# Patient Record
Sex: Female | Born: 1935 | Race: White | Hispanic: No | Smoking: Never smoker
Health system: Southern US, Community
[De-identification: ages and names within clinical notes are randomized; demographics above are authoritative.]

## PROBLEM LIST (undated history)

## (undated) DIAGNOSIS — E785 Hyperlipidemia, unspecified: Secondary | ICD-10-CM

## (undated) DIAGNOSIS — M545 Low back pain: Secondary | ICD-10-CM

## (undated) DIAGNOSIS — F419 Anxiety disorder, unspecified: Secondary | ICD-10-CM

## (undated) DIAGNOSIS — E538 Deficiency of other specified B group vitamins: Secondary | ICD-10-CM

## (undated) DIAGNOSIS — H509 Unspecified strabismus: Secondary | ICD-10-CM

## (undated) DIAGNOSIS — K635 Polyp of colon: Secondary | ICD-10-CM

## (undated) DIAGNOSIS — G629 Polyneuropathy, unspecified: Secondary | ICD-10-CM

## (undated) DIAGNOSIS — I10 Essential (primary) hypertension: Secondary | ICD-10-CM

## (undated) HISTORY — DX: Low back pain: M54.5

## (undated) HISTORY — DX: Polyp of colon: K63.5

## (undated) HISTORY — DX: Unspecified strabismus: H50.9

## (undated) HISTORY — DX: Essential (primary) hypertension: I10

## (undated) HISTORY — DX: Hyperlipidemia, unspecified: E78.5

## (undated) HISTORY — DX: Deficiency of other specified B group vitamins: E53.8

## (undated) HISTORY — PX: ABDOMINAL HYSTERECTOMY: SHX81

## (undated) HISTORY — DX: Anxiety disorder, unspecified: F41.9

## (undated) HISTORY — DX: Polyneuropathy, unspecified: G62.9

---

## 1998-03-21 ENCOUNTER — Ambulatory Visit (HOSPITAL_COMMUNITY): Admission: RE | Admit: 1998-03-21 | Discharge: 1998-03-21 | Payer: Self-pay | Admitting: Gastroenterology

## 2002-04-29 ENCOUNTER — Encounter: Admission: RE | Admit: 2002-04-29 | Discharge: 2002-04-29 | Payer: Self-pay | Admitting: Gastroenterology

## 2002-04-29 ENCOUNTER — Encounter: Payer: Self-pay | Admitting: Gastroenterology

## 2003-03-08 ENCOUNTER — Other Ambulatory Visit: Admission: RE | Admit: 2003-03-08 | Discharge: 2003-03-08 | Payer: Self-pay | Admitting: Internal Medicine

## 2004-09-25 ENCOUNTER — Ambulatory Visit: Payer: Self-pay | Admitting: Internal Medicine

## 2004-09-26 ENCOUNTER — Ambulatory Visit: Payer: Self-pay | Admitting: Internal Medicine

## 2005-04-15 ENCOUNTER — Ambulatory Visit: Payer: Self-pay | Admitting: Internal Medicine

## 2006-04-10 ENCOUNTER — Ambulatory Visit: Payer: Self-pay | Admitting: Internal Medicine

## 2006-10-14 ENCOUNTER — Ambulatory Visit: Payer: Self-pay | Admitting: Internal Medicine

## 2006-12-14 ENCOUNTER — Ambulatory Visit: Payer: Self-pay | Admitting: Internal Medicine

## 2006-12-14 LAB — CONVERTED CEMR LAB
ALT: 16 units/L (ref 0–40)
AST: 18 units/L (ref 0–37)
Albumin: 3.8 g/dL (ref 3.5–5.2)
Alkaline Phosphatase: 55 units/L (ref 39–117)
BUN: 20 mg/dL (ref 6–23)
Basophils Absolute: 0 10*3/uL (ref 0.0–0.1)
Basophils Relative: 0.8 % (ref 0.0–1.0)
Bilirubin Urine: NEGATIVE
Bilirubin, Direct: 0.1 mg/dL (ref 0.0–0.3)
CO2: 28 meq/L (ref 19–32)
Calcium: 8.9 mg/dL (ref 8.4–10.5)
Chloride: 110 meq/L (ref 96–112)
Cholesterol: 253 mg/dL (ref 0–200)
Creatinine, Ser: 0.8 mg/dL (ref 0.4–1.2)
Direct LDL: 126.5 mg/dL
Eosinophils Absolute: 0.1 10*3/uL (ref 0.0–0.6)
Eosinophils Relative: 2.3 % (ref 0.0–5.0)
GFR calc Af Amer: 90 mL/min
GFR calc non Af Amer: 74 mL/min
Glucose, Bld: 124 mg/dL — ABNORMAL HIGH (ref 70–99)
HCT: 39.3 % (ref 36.0–46.0)
HDL: 37.5 mg/dL — ABNORMAL LOW (ref >39.0)
Hemoglobin, Urine: NEGATIVE
Hemoglobin: 13.9 g/dL (ref 12.0–15.0)
Ketones, ur: NEGATIVE mg/dL
Leukocytes, UA: NEGATIVE
Lymphocytes Relative: 39.6 % (ref 12.0–46.0)
MCHC: 35.5 g/dL (ref 30.0–36.0)
MCV: 92.6 fL (ref 78.0–100.0)
Monocytes Absolute: 0.4 10*3/uL (ref 0.2–0.7)
Monocytes Relative: 8.5 % (ref 3.0–11.0)
Neutro Abs: 2.5 10*3/uL (ref 1.4–7.7)
Neutrophils Relative %: 48.8 % (ref 43.0–77.0)
Nitrite: NEGATIVE
Platelets: 229 10*3/uL (ref 150–400)
Potassium: 4.1 meq/L (ref 3.5–5.1)
RBC: 4.24 M/uL (ref 3.87–5.11)
RDW: 12.1 % (ref 11.5–14.6)
Sodium: 143 meq/L (ref 135–145)
Specific Gravity, Urine: 1.025 (ref 1.000–1.03)
TSH: 2.9 microintl units/mL (ref 0.35–5.50)
Total Bilirubin: 0.7 mg/dL (ref 0.3–1.2)
Total CHOL/HDL Ratio: 6.7
Total Protein, Urine: NEGATIVE mg/dL
Total Protein: 6.1 g/dL (ref 6.0–8.3)
Triglycerides: 412 mg/dL (ref 0–149)
Urine Glucose: NEGATIVE mg/dL
Urobilinogen, UA: 0.2 (ref 0.0–1.0)
VLDL: 82 mg/dL — ABNORMAL HIGH (ref 0–40)
Vit D, 1,25-Dihydroxy: 11 — ABNORMAL LOW (ref 20–57)
WBC: 4.9 10*3/uL (ref 4.5–10.5)
pH: 5.5 (ref 5.0–8.0)

## 2006-12-31 ENCOUNTER — Ambulatory Visit: Payer: Self-pay | Admitting: Internal Medicine

## 2007-04-15 ENCOUNTER — Ambulatory Visit: Payer: Self-pay | Admitting: Internal Medicine

## 2007-05-21 ENCOUNTER — Encounter: Payer: Self-pay | Admitting: Internal Medicine

## 2007-05-21 DIAGNOSIS — Z860100 Personal history of colon polyps, unspecified: Secondary | ICD-10-CM | POA: Insufficient documentation

## 2007-05-21 DIAGNOSIS — I10 Essential (primary) hypertension: Secondary | ICD-10-CM | POA: Insufficient documentation

## 2007-05-21 DIAGNOSIS — E785 Hyperlipidemia, unspecified: Secondary | ICD-10-CM

## 2007-05-21 DIAGNOSIS — M810 Age-related osteoporosis without current pathological fracture: Secondary | ICD-10-CM | POA: Insufficient documentation

## 2007-05-21 DIAGNOSIS — G4700 Insomnia, unspecified: Secondary | ICD-10-CM

## 2007-06-14 ENCOUNTER — Ambulatory Visit: Payer: Self-pay | Admitting: Internal Medicine

## 2007-06-14 DIAGNOSIS — R7309 Other abnormal glucose: Secondary | ICD-10-CM

## 2007-06-14 DIAGNOSIS — F411 Generalized anxiety disorder: Secondary | ICD-10-CM | POA: Insufficient documentation

## 2007-06-14 DIAGNOSIS — E559 Vitamin D deficiency, unspecified: Secondary | ICD-10-CM | POA: Insufficient documentation

## 2007-06-14 LAB — CONVERTED CEMR LAB
ALT: 14 units/L (ref 0–35)
AST: 16 units/L (ref 0–37)
Albumin: 4.1 g/dL (ref 3.5–5.2)
Alkaline Phosphatase: 44 units/L (ref 39–117)
BUN: 9 mg/dL (ref 6–23)
Bilirubin, Direct: 0.2 mg/dL (ref 0.0–0.3)
CO2: 30 meq/L (ref 19–32)
Calcium: 9.2 mg/dL (ref 8.4–10.5)
Chloride: 105 meq/L (ref 96–112)
Cholesterol: 152 mg/dL (ref 0–200)
Creatinine, Ser: 0.8 mg/dL (ref 0.4–1.2)
GFR calc Af Amer: 90 mL/min
GFR calc non Af Amer: 74 mL/min
Glucose, Bld: 103 mg/dL — ABNORMAL HIGH (ref 70–99)
HDL: 43.6 mg/dL (ref >39.0)
Hgb A1c MFr Bld: 6.1 % — ABNORMAL HIGH (ref 4.6–6.0)
LDL Cholesterol: 84 mg/dL (ref 0–99)
Potassium: 4.4 meq/L (ref 3.5–5.1)
Sodium: 140 meq/L (ref 135–145)
Total Bilirubin: 0.8 mg/dL (ref 0.3–1.2)
Total CHOL/HDL Ratio: 3.5
Total Protein: 6.2 g/dL (ref 6.0–8.3)
Triglycerides: 124 mg/dL (ref 0–149)
VLDL: 25 mg/dL (ref 0–40)

## 2007-06-18 LAB — CONVERTED CEMR LAB: Vit D, 1,25-Dihydroxy: 30 (ref 30–89)

## 2007-09-15 ENCOUNTER — Ambulatory Visit: Payer: Self-pay | Admitting: Internal Medicine

## 2007-09-15 DIAGNOSIS — E538 Deficiency of other specified B group vitamins: Secondary | ICD-10-CM | POA: Insufficient documentation

## 2007-09-15 DIAGNOSIS — G609 Hereditary and idiopathic neuropathy, unspecified: Secondary | ICD-10-CM | POA: Insufficient documentation

## 2007-09-17 LAB — CONVERTED CEMR LAB
CO2: 30 meq/L (ref 19–32)
Calcium: 9.2 mg/dL (ref 8.4–10.5)
GFR calc Af Amer: 90 mL/min
Glucose, Bld: 129 mg/dL — ABNORMAL HIGH (ref 70–99)
HDL: 41.5 mg/dL (ref >39.0)
LDL Cholesterol: 101 mg/dL — ABNORMAL HIGH (ref 0–99)
TSH: 2.1 microintl units/mL (ref 0.35–5.50)
Total CHOL/HDL Ratio: 4.2
Triglycerides: 165 mg/dL — ABNORMAL HIGH (ref 0–149)
VLDL: 33 mg/dL (ref 0–40)

## 2007-09-27 ENCOUNTER — Ambulatory Visit: Payer: Self-pay

## 2007-09-27 ENCOUNTER — Ambulatory Visit: Payer: Self-pay | Admitting: Endocrinology

## 2007-09-27 DIAGNOSIS — S8010XA Contusion of unspecified lower leg, initial encounter: Secondary | ICD-10-CM

## 2007-09-29 ENCOUNTER — Telehealth: Payer: Self-pay | Admitting: Internal Medicine

## 2007-10-04 ENCOUNTER — Encounter: Payer: Self-pay | Admitting: Internal Medicine

## 2008-01-13 ENCOUNTER — Ambulatory Visit: Payer: Self-pay | Admitting: Internal Medicine

## 2008-03-30 ENCOUNTER — Ambulatory Visit: Payer: Self-pay | Admitting: Internal Medicine

## 2008-07-19 ENCOUNTER — Ambulatory Visit: Payer: Self-pay | Admitting: Internal Medicine

## 2008-07-19 ENCOUNTER — Telehealth: Payer: Self-pay | Admitting: Internal Medicine

## 2008-07-19 DIAGNOSIS — R05 Cough: Secondary | ICD-10-CM

## 2008-07-19 DIAGNOSIS — J209 Acute bronchitis, unspecified: Secondary | ICD-10-CM

## 2008-11-06 ENCOUNTER — Ambulatory Visit: Payer: Self-pay | Admitting: Internal Medicine

## 2008-11-06 LAB — CONVERTED CEMR LAB
Albumin: 4 g/dL (ref 3.5–5.2)
Alkaline Phosphatase: 38 units/L — ABNORMAL LOW (ref 39–117)
BUN: 19 mg/dL (ref 6–23)
Basophils Absolute: 0 10*3/uL (ref 0.0–0.1)
Bilirubin Urine: NEGATIVE
Bilirubin, Direct: 0.2 mg/dL (ref 0.0–0.3)
CO2: 29 meq/L (ref 19–32)
Calcium: 9.1 mg/dL (ref 8.4–10.5)
Creatinine, Ser: 0.8 mg/dL (ref 0.4–1.2)
Creatinine,U: 252 mg/dL
Eosinophils Absolute: 0.1 10*3/uL (ref 0.0–0.7)
Glucose, Bld: 108 mg/dL — ABNORMAL HIGH (ref 70–99)
HDL: 43.2 mg/dL (ref >39.00)
Hemoglobin, Urine: NEGATIVE
Ketones, ur: NEGATIVE mg/dL
Lymphocytes Relative: 39.6 % (ref 12.0–46.0)
MCHC: 34.6 g/dL (ref 30.0–36.0)
Microalb, Ur: 1.7 mg/dL (ref 0.0–1.9)
Neutrophils Relative %: 49.7 % (ref 43.0–77.0)
Nitrite: NEGATIVE
RDW: 12 % (ref 11.5–14.6)
Total CHOL/HDL Ratio: 4
Triglycerides: 126 mg/dL (ref 0.0–149.0)
Vitamin B-12: 815 pg/mL (ref 211–911)

## 2008-11-10 ENCOUNTER — Ambulatory Visit: Payer: Self-pay | Admitting: Internal Medicine

## 2009-01-15 ENCOUNTER — Encounter: Payer: Self-pay | Admitting: Internal Medicine

## 2009-02-13 ENCOUNTER — Ambulatory Visit: Payer: Self-pay | Admitting: Internal Medicine

## 2009-02-13 ENCOUNTER — Telehealth: Payer: Self-pay | Admitting: Internal Medicine

## 2009-02-16 ENCOUNTER — Ambulatory Visit: Payer: Self-pay | Admitting: Internal Medicine

## 2009-02-19 ENCOUNTER — Encounter (INDEPENDENT_AMBULATORY_CARE_PROVIDER_SITE_OTHER): Payer: Self-pay | Admitting: *Deleted

## 2009-04-17 ENCOUNTER — Ambulatory Visit: Payer: Self-pay | Admitting: Internal Medicine

## 2009-05-25 ENCOUNTER — Ambulatory Visit: Payer: Self-pay | Admitting: Internal Medicine

## 2009-05-25 ENCOUNTER — Telehealth: Payer: Self-pay | Admitting: Internal Medicine

## 2009-05-25 DIAGNOSIS — Z87891 Personal history of nicotine dependence: Secondary | ICD-10-CM

## 2009-06-04 ENCOUNTER — Telehealth: Payer: Self-pay | Admitting: Internal Medicine

## 2009-06-07 ENCOUNTER — Ambulatory Visit: Payer: Self-pay | Admitting: Internal Medicine

## 2009-06-07 DIAGNOSIS — R062 Wheezing: Secondary | ICD-10-CM | POA: Insufficient documentation

## 2009-06-14 ENCOUNTER — Telehealth: Payer: Self-pay | Admitting: Internal Medicine

## 2009-06-15 ENCOUNTER — Ambulatory Visit: Payer: Self-pay | Admitting: Internal Medicine

## 2009-07-07 DIAGNOSIS — M545 Low back pain, unspecified: Secondary | ICD-10-CM

## 2009-07-07 HISTORY — DX: Low back pain, unspecified: M54.50

## 2009-07-25 ENCOUNTER — Telehealth: Payer: Self-pay | Admitting: Internal Medicine

## 2009-08-24 ENCOUNTER — Telehealth: Payer: Self-pay | Admitting: Internal Medicine

## 2009-09-21 ENCOUNTER — Telehealth: Payer: Self-pay | Admitting: Internal Medicine

## 2009-11-23 ENCOUNTER — Telehealth: Payer: Self-pay | Admitting: Internal Medicine

## 2010-01-18 ENCOUNTER — Encounter: Payer: Self-pay | Admitting: Internal Medicine

## 2010-01-21 ENCOUNTER — Telehealth: Payer: Self-pay | Admitting: Internal Medicine

## 2010-03-14 ENCOUNTER — Ambulatory Visit: Payer: Self-pay | Admitting: Internal Medicine

## 2010-03-25 ENCOUNTER — Telehealth: Payer: Self-pay | Admitting: Internal Medicine

## 2010-04-17 ENCOUNTER — Ambulatory Visit: Payer: Self-pay | Admitting: Internal Medicine

## 2010-04-17 DIAGNOSIS — M545 Low back pain: Secondary | ICD-10-CM

## 2010-04-17 LAB — CONVERTED CEMR LAB
Bilirubin Urine: NEGATIVE
Nitrite: NEGATIVE
Specific Gravity, Urine: 1.02 (ref 1.000–1.030)
Total Protein, Urine: NEGATIVE mg/dL
pH: 5.5 (ref 5.0–8.0)

## 2010-05-23 ENCOUNTER — Telehealth: Payer: Self-pay | Admitting: Internal Medicine

## 2010-08-04 LAB — CONVERTED CEMR LAB
CO2: 29 meq/L (ref 19–32)
Chloride: 107 meq/L (ref 96–112)
Creatinine, Ser: 0.9 mg/dL (ref 0.4–1.2)
Hgb A1c MFr Bld: 6.5 % — ABNORMAL HIGH (ref 4.6–6.0)
Vitamin B-12: 903 pg/mL (ref 211–911)

## 2010-08-06 NOTE — Progress Notes (Signed)
Summary: Benicar PA  Phone Note From Pharmacy   [facility]  [address] [id number] Call For: [phone]  ID:  [phone]  Summary of Call: PA request--Generic Benicar. Per [email or web address] cannot be done online. They will fax form. Initial call taken by: Lucious Groves,  July 25, 2009 2:22 PM  Follow-up for Phone Call        Form complete and at triage awaiting signature. Follow-up by: Lucious Groves,  July 25, 2009 2:55 PM  Additional Follow-up for Phone Call Additional follow up Details #1::        [facility] stating that alt for Benicar are Cozzar, Diovan and micardis. They would like to know if MD would consider or should be continue to get PA for benicar. Please advise Additional Follow-up by: Rock Nephew CMA,  July 25, 2009 4:09 PM    Additional Follow-up for Phone Call Additional follow up Details #2::    OK Diovan Follow-up by: Tresa Garter MD,  July 26, 2009 7:47 AM  Additional Follow-up for Phone Call Additional follow up Details #3:: Details for Additional Follow-up Action Taken: Pt informed, rx sent in Additional Follow-up by: Lamar Sprinkles, CMA,  July 26, 2009 8:45 AM  New/Updated Medications: DIOVAN 320 MG TABS (VALSARTAN) 1 by mouth once daily for blood pressure Prescriptions: DIOVAN 320 MG TABS (VALSARTAN) 1 by mouth once daily for blood pressure  #90 x 3   Entered by:   Lamar Sprinkles, CMA   Authorized by:   Tresa Garter MD   Signed by:   Lamar Sprinkles, CMA on 07/26/2009   Method used:   [facility]        [facility]  [address] [id number]       [address]       [place], [place]  [address]       Ph: [phone] or [phone]       Fax: [phone]   RxID:   [id number]

## 2010-08-06 NOTE — Assessment & Plan Note (Signed)
Summary: R LOWER SIDE BACK PAIN--REFUSED EARLIER DY W/ANOTHER MD--STC   Vital Signs:  Patient profile:   77 year old female Height:      62 inches Weight:      150 pounds BMI:     27.53 Temp:     97.7 degrees F oral Pulse rate:   72 / minute Pulse rhythm:   regular Resp:     16 per minute BP sitting:   160 / 98  (left arm) Cuff size:   regular  Vitals Entered By: Lanier Prude, CMA(AAMA) (April 17, 2010 1:23 PM) CC: LBP x 1 mo Is Patient Diabetic? No Comments pt needs Rf on Simvastating, Atenolol, Temazepam, Lorazepam and Diovan   Primary Care Provider:  Tresa Garter MD  CC:  LBP x 1 mo.  History of Present Illness: C/o R LBP x 1 month w/o irrad., bad pain. Heat is helpfull. It is agravated by standing. F/u HTN  Current Medications (verified): 1)  Simvastatin 40 Mg Tabs (Simvastatin) .... Take One Tablet Qd 2)  Atenolol 100 Mg Tabs (Atenolol) .Marland Kitchen.. 1 By Mouth Bid 3)  Temazepam 30 Mg  Caps (Temazepam) .... 2 At Bedtime 4)  Lorazepam 1 Mg  Tabs (Lorazepam) .... Two Times A Day As Needed Daytime 5)  Vitamin D3 1000 Unit  Tabs (Cholecalciferol) .Marland Kitchen.. 1 Qd 6)  Vitamin B-12 Cr 1000 Mcg  Tbcr (Cyanocobalamin) .... Take One Tablet By Mouth Daily 7)  Microzide 12.5 Mg Caps (Hydrochlorothiazide) .Marland Kitchen.. 1 By Mouth Q Am 8)  Promethazine-Codeine 6.25-10 Mg/2ml Syrp (Promethazine-Codeine) .Marland Kitchen.. 1 Tsp By Mouth Q 6 Hrs  As Needed Cough 9)  Proair Hfa 108 (90 Base) Mcg/act Aers (Albuterol Sulfate) .Marland Kitchen.. 1 Puff Four Times Per Day As Needed For Shortness of Breath 10)  Tessalon Perles 100 Mg Caps (Benzonatate) .Marland Kitchen.. 1 - 2 By Mouth Three Times A Day As Needed 11)  Prednisone 10 Mg Tabs (Prednisone) .... 3po Qd For 3days, Then 2po Qd For 3days, Then 1po Qd For 3days, Then Stop 12)  Diovan 320 Mg Tabs (Valsartan) .Marland Kitchen.. 1 By Mouth Once Daily For Blood Pressure  Allergies (verified): 1)  Zithromax 2)  Enalapril Maleate  Past History:  Social History: Last updated: 11/10/2008 Occupation:  Cust. service Single widow Former Smoker Retired  Past Medical History: Colonic polyps, hx of 272.0 Hyperlipidemia Hypertension Osteoporosis Anxiety Vit D def Peripheral neuropathy Vit B12 def strabismus, L eye Low back pain 2011 R  Review of Systems  The patient denies anorexia, fever, chest pain, dyspnea on exertion, and abdominal pain.    Physical Exam  General:  alert and overweight-appearing, NAD Nose:  nasal dischargemucosal pallor and mucosal erythema.   Mouth:  Erythematous throat mucosa and intranasal erythema is better Neck:  supple and no masses.   Lungs:  normal respiratory effort, R decreased breath sounds, R wheezes, L decreased breath sounds, and L wheezes.   Heart:  normal rate and regular rhythm.   Abdomen:  S/NT Neurologic:  alert & oriented X3.   Skin:  Intact without suspicious lesions or rashes Psych:  Oriented X3.     Impression & Recommendations:  Problem # 1:  LOW BACK PAIN (ICD-724.2) R MSK over R iliac crest and Some in R SI joint Assessment New  See "Patient Instructions".   Her updated medication list for this problem includes:    Ibuprofen 600 Mg Tabs (Ibuprofen) .Marland Kitchen... 1 by mouth bid  pc x 2 wk then as needed for  pain  Orders: TLB-Udip ONLY (81003-UDIP) T-Pelvis 1or 2 views (72170TC)  Problem # 2:  Hypertension Assessment: Unchanged Pt states BP is nl at home  Problem # 3:  B12 DEFICIENCY (ICD-266.2) Assessment: Comment Only On the regimen of medicine(s) reflected in the chart    Complete Medication List: 1)  Simvastatin 40 Mg Tabs (Simvastatin) .... Take one tablet qd 2)  Atenolol 100 Mg Tabs (Atenolol) .Marland Kitchen.. 1 by mouth bid 3)  Temazepam 30 Mg Caps (Temazepam) .... 2 at bedtime 4)  Lorazepam 1 Mg Tabs (Lorazepam) .... Two times a day as needed daytime 5)  Vitamin D3 1000 Unit Tabs (Cholecalciferol) .Marland Kitchen.. 1 qd 6)  Vitamin B-12 Cr 1000 Mcg Tbcr (Cyanocobalamin) .... Take one tablet by mouth daily 7)  Microzide 12.5 Mg Caps  (Hydrochlorothiazide) .Marland Kitchen.. 1 by mouth q am 8)  Promethazine-codeine 6.25-10 Mg/52ml Syrp (Promethazine-codeine) .Marland Kitchen.. 1 tsp by mouth q 6 hrs  as needed cough 9)  Proair Hfa 108 (90 Base) Mcg/act Aers (Albuterol sulfate) .Marland Kitchen.. 1 puff four times per day as needed for shortness of breath 10)  Tessalon Perles 100 Mg Caps (Benzonatate) .Marland Kitchen.. 1 - 2 by mouth three times a day as needed 11)  Prednisone 10 Mg Tabs (Prednisone) .... 3po qd for 3days, then 2po qd for 3days, then 1po qd for 3days, then stop 12)  Ibuprofen 600 Mg Tabs (Ibuprofen) .Marland Kitchen.. 1 by mouth bid  pc x 2 wk then as needed for  pain 13)  Azor 5-40 Mg Tabs (Amlodipine-olmesartan) .Marland Kitchen.. 1 by mouth qd  Patient Instructions: 1)  Use stretching and balance exercises that I have provided (15 min. or longer every day)  2)  Call if you are not better in a reasonable amount of time or if worse.  3)  Please schedule a follow-up appointment in 1 month. Prescriptions: AZOR 5-40 MG TABS (AMLODIPINE-OLMESARTAN) 1 by mouth qd  #30 x 11   Entered and Authorized by:   Tresa Garter MD   Signed by:   Tresa Garter MD on 04/17/2010   Method used:   Print then Give to Patient   RxID:   [id number] IBUPROFEN 600 MG TABS (IBUPROFEN) 1 by mouth bid  pc x 2 wk then as needed for  pain  #60 x 3   Entered and Authorized by:   Tresa Garter MD   Signed by:   Tresa Garter MD on 04/17/2010   Method used:   Print then Give to Patient   RxID:   [id number] LORAZEPAM 1 MG  TABS (LORAZEPAM) two times a day as needed daytime  #60 x 3   Entered and Authorized by:   Tresa Garter MD   Signed by:   Tresa Garter MD on 04/17/2010   Method used:   Print then Give to Patient   RxID:   [id number] TEMAZEPAM 30 MG  CAPS (TEMAZEPAM) 2 at bedtime  #60 x 6   Entered and Authorized by:   Tresa Garter MD   Signed by:   Tresa Garter MD on 04/17/2010   Method used:   Print then Give to Patient   RxID:    [id number] ATENOLOL 100 MG TABS (ATENOLOL) 1 by mouth bid  #60 Tablet x 11   Entered and Authorized by:   Tresa Garter MD   Signed by:   Tresa Garter MD on 04/17/2010   Method used:   Print then Give to Patient   RxID:   [id number]  SIMVASTATIN 40 MG TABS (SIMVASTATIN) Take one tablet qd  #30 Tablet x 11   Entered and Authorized by:   Tresa Garter MD   Signed by:   Tresa Garter MD on 04/17/2010   Method used:   Print then Give to Patient   RxID:   [id number] IBUPROFEN 600 MG TABS (IBUPROFEN) 1 by mouth bid  pc x 2 wk then as needed for  pain  #60 x 3   Entered and Authorized by:   Tresa Garter MD   Signed by:   Tresa Garter MD on 04/17/2010   Method used:   [facility]        [facility]  [address] [id number]       [address]       [place], [place]  [address]       Ph: [phone] or [phone]       Fax: [phone]   RxID:   [id number]

## 2010-08-06 NOTE — Progress Notes (Signed)
Summary: temazepam  Phone Note Other Incoming Call back at fax   [facility]  [phone] Summary of Call: refill on temazepam---ok x 1 refill--will fax back to pharmacy/vg Initial call taken by: Tora Perches,  August 24, 2009 4:49 PM    Prescriptions: TEMAZEPAM 30 MG  CAPS (TEMAZEPAM) 2 at bedtime  #60 x 1   Entered by:   Tora Perches   Authorized by:   Tresa Garter MD   Signed by:   Tora Perches on 08/24/2009   Method used:   Telephoned to ...       [facility]  [address] [id number]       [address]       [place], [place]  [address]       Ph: [phone] or [phone]       Fax: [phone]   RxID:   [id number]

## 2010-08-06 NOTE — Progress Notes (Signed)
Summary: DIOVAN  Phone Note Call from Patient Call back at Home Phone [phone]   Summary of Call: Pt was changed from Diovan to Azor. She is c/o hot flashes & sweats. Patient is requesting to change back to Diovan. She is ok w/cost Initial call taken by: Lamar Sprinkles, CMA,  May 23, 2010 10:10 AM  Follow-up for Phone Call        ok RTC 1-2 months  Follow-up by: Tresa Garter MD,  May 23, 2010 1:00 PM  Additional Follow-up for Phone Call Additional follow up Details #1::        Pt informed  Additional Follow-up by: Lamar Sprinkles, CMA,  May 23, 2010 2:23 PM   New Allergies: AZOR New/Updated Medications: DIOVAN 320 MG TABS (VALSARTAN) 1 by mouth once daily for blood pressure New Allergies: AZORPrescriptions: DIOVAN 320 MG TABS (VALSARTAN) 1 by mouth once daily for blood pressure  #90 x 3   Entered and Authorized by:   Tresa Garter MD   Signed by:   Lamar Sprinkles, CMA on 05/23/2010   Method used:   [facility]        [facility]  [address] [id number]       [address]       [place], [place]  [address]       Ph: [phone] or [phone]       Fax: [phone]   RxID:   [id number]

## 2010-08-06 NOTE — Progress Notes (Signed)
Summary: Lorazepam Rf  Phone Note Refill Request Message from:  Fax from Pharmacy  Refills Requested: Medication #1:  LORAZEPAM 1 MG  TABS two times a day as needed daytime   Dosage confirmed as above?Dosage Confirmed   Supply Requested: 60   Last Refilled: 02/20/2010 ***Last seen 06/2009****   Method Requested: Telephone to Pharmacy Next Appointment Scheduled: none Initial call taken by: Lanier Prude, [facility]),  March 25, 2010 11:38 AM  Follow-up for Phone Call        ok x 3 Follow-up by: Tresa Garter MD,  March 25, 2010 1:31 PM  Additional Follow-up for Phone Call Additional follow up Details #1::        Rx called to pharmacy Additional Follow-up by: Lanier Prude, [facility]),  March 25, 2010 2:15 PM    Prescriptions: LORAZEPAM 1 MG  TABS (LORAZEPAM) two times a day as needed daytime  #60 x 3   Entered by:   Lanier Prude, [facility])   Authorized by:   Tresa Garter MD   Signed by:   Lanier Prude, CMA(AAMA) on 03/25/2010   Method used:   Telephoned to ...       [facility]  [address] [id number]       [address]       [place], [place]  [address]       Ph: [phone] or [phone]       Fax: [phone]   RxID:   [id number]

## 2010-08-06 NOTE — Assessment & Plan Note (Signed)
Summary: flu shot/cd   Nurse Visit   Allergies: 1)  Zithromax 2)  Enalapril Maleate  Immunizations Administered:  Pneumonia Vaccine:    Vaccine Type: Pneumovax (Medicare)    Site: left deltoid    Mfr: Merck    Dose: 0.5 ml    Route: IM    Given by: Margaret Pyle, CMA    Exp. Date: 09/19/2011    Lot #: [id number]    VIS given: 06/11/09 version given March 14, 2010.  Orders Added: 1)  Flu Vaccine 6yrs + MEDICARE PATIENTS [Q2039] 2)  Administration Flu vaccine - MCR [G0008] 3)  Pneumococcal Vaccine [90732] 4)  Admin 1st Vaccine [47829] Flu Vaccine Consent Questions     Do you have a history of severe allergic reactions to this vaccine? no    Any prior history of allergic reactions to egg and/or gelatin? no    Do you have a sensitivity to the preservative Thimersol? no    Do you have a past history of Guillan-Barre Syndrome? no    Do you currently have an acute febrile illness? no    Have you ever had a severe reaction to latex? no    Vaccine information given and explained to patient? yes    Are you currently pregnant? no    Lot Number:[id number]   Exp Date:01/04/2011   Site Given  Right Deltoid IMmedflu

## 2010-08-06 NOTE — Progress Notes (Signed)
Summary: Rf Temazepam  Phone Note Refill Request Message from:  Fax from Pharmacy  Refills Requested: Medication #1:  TEMAZEPAM 30 MG  CAPS 2 at bedtime   Dosage confirmed as above?Dosage Confirmed   Supply Requested: 1 month   Last Refilled: 12/24/2009 last OV 06/2009   Method Requested: Telephone to Pharmacy Next Appointment Scheduled: none Initial call taken by: Lanier Prude, [facility]),  January 21, 2010 11:57 AM  Follow-up for Phone Call        ok X 6 Follow-up by: Tresa Garter MD,  January 21, 2010 1:04 PM  Additional Follow-up for Phone Call Additional follow up Details #1::        Rx called to pharmacy Additional Follow-up by: Lanier Prude, [facility]),  January 21, 2010 1:28 PM    Prescriptions: TEMAZEPAM 30 MG  CAPS (TEMAZEPAM) 2 at bedtime  #60 x 6   Entered and Authorized by:   Lanier Prude, CMA(AAMA)   Signed by:   Lanier Prude, CMA(AAMA) on 01/21/2010   Method used:   Telephoned to ...       [facility]  [address] [id number]       [address]       [place], [place]  [address]       Ph: [phone] or [phone]       Fax: [phone]   RxID:   [id number]

## 2010-08-06 NOTE — Progress Notes (Signed)
Summary: temazepam  Phone Note Other Incoming Call back at fax   [facility] [phone] Summary of Call: refill request on temazepam Initial call taken by: Tora Perches,  September 21, 2009 3:20 PM    Prescriptions: TEMAZEPAM 30 MG  CAPS (TEMAZEPAM) 2 at bedtime  #60 x 3   Entered by:   Tora Perches   Authorized by:   Tresa Garter MD   Signed by:   Tora Perches on 09/21/2009   Method used:   Telephoned to ...       [facility]  [address] [id number]       [address]       [place], [place]  [address]       Ph: [phone] or [phone]       Fax: [phone]   RxID:   [id number]

## 2010-08-06 NOTE — Progress Notes (Signed)
Summary: Refill--Lorazepam  Phone Note Refill Request Message from:  Fax from Pharmacy on Nov 23, 2009 2:17 PM  Refills Requested: Medication #1:  LORAZEPAM 1 MG  TABS two times a day as needed daytime Initial call taken by: Lucious Groves,  Nov 23, 2009 2:17 PM  Follow-up for Phone Call        ok 3 ref Follow-up by: Tresa Garter MD,  Nov 23, 2009 5:16 PM    Prescriptions: LORAZEPAM 1 MG  TABS (LORAZEPAM) two times a day as needed daytime  #60 x 3   Entered by:   Lamar Sprinkles, CMA   Authorized by:   Tresa Garter MD   Signed by:   Lamar Sprinkles, CMA on 11/23/2009   Method used:   Telephoned to ...       [facility]  [address] [id number]       [address]       [place], [place]  [address]       Ph: [phone] or [phone]       Fax: [phone]   RxID:   [id number]

## 2010-09-25 ENCOUNTER — Other Ambulatory Visit: Payer: Self-pay | Admitting: Internal Medicine

## 2010-09-26 ENCOUNTER — Telehealth: Payer: Self-pay | Admitting: *Deleted

## 2010-09-26 NOTE — Telephone Encounter (Signed)
Ok 2 ref 

## 2010-09-26 NOTE — Telephone Encounter (Signed)
Rf req for Lorazepam 1 mg  1 po bid prn  # 60.  Last filled 08-23-10.Marland Kitchen..Marland Kitchenok to Rf?

## 2010-09-27 ENCOUNTER — Telehealth: Payer: Self-pay | Admitting: *Deleted

## 2010-09-27 MED ORDER — LORAZEPAM 1 MG PO TABS: 1.0000 mg | ORAL_TABLET | Freq: Two times a day (BID) | ORAL | Status: DC

## 2010-09-27 NOTE — Telephone Encounter (Signed)
Rf phoned in.  

## 2010-09-27 NOTE — Telephone Encounter (Signed)
error 

## 2010-10-01 ENCOUNTER — Telehealth: Payer: Self-pay | Admitting: *Deleted

## 2010-10-01 DIAGNOSIS — I10 Essential (primary) hypertension: Secondary | ICD-10-CM

## 2010-10-01 NOTE — Telephone Encounter (Signed)
Pt is req cholesterol and glucose be checked prior to 11-13-10 appt with you....please add orders.

## 2010-10-02 NOTE — Telephone Encounter (Signed)
Pt informed

## 2010-10-02 NOTE — Telephone Encounter (Signed)
ok 

## 2010-10-23 ENCOUNTER — Telehealth: Payer: Self-pay | Admitting: *Deleted

## 2010-10-23 NOTE — Telephone Encounter (Signed)
rec rf req for Temazepam 30mg .   2 po qhs.  # 60. Last filled 09-25-10.  Ok to Rf?

## 2010-10-24 ENCOUNTER — Telehealth: Payer: Self-pay | Admitting: *Deleted

## 2010-10-24 NOTE — Telephone Encounter (Signed)
rec rf req for Temazepam 30mg  2 po qhs. # 60. Ok to Rf?

## 2010-10-25 MED ORDER — TEMAZEPAM 30 MG PO CAPS: 60.0000 mg | ORAL_CAPSULE | Freq: Every evening | ORAL | Status: DC | PRN

## 2010-10-25 NOTE — Telephone Encounter (Signed)
rf phoned in 

## 2010-10-25 NOTE — Telephone Encounter (Signed)
OK to fill this prescription with additional refills x3 Thank you!  

## 2010-10-25 NOTE — Telephone Encounter (Signed)
OK to fill this prescription with additional refills x1. Is she due OV? - pls sch Thank you!

## 2010-11-05 ENCOUNTER — Other Ambulatory Visit (INDEPENDENT_AMBULATORY_CARE_PROVIDER_SITE_OTHER): Payer: BC Managed Care – PPO | Admitting: Internal Medicine

## 2010-11-05 ENCOUNTER — Other Ambulatory Visit (INDEPENDENT_AMBULATORY_CARE_PROVIDER_SITE_OTHER): Payer: BC Managed Care – PPO

## 2010-11-05 DIAGNOSIS — I10 Essential (primary) hypertension: Secondary | ICD-10-CM

## 2010-11-05 DIAGNOSIS — E785 Hyperlipidemia, unspecified: Secondary | ICD-10-CM

## 2010-11-05 LAB — BASIC METABOLIC PANEL
BUN: 15 mg/dL (ref 6–23)
CO2: 29 mEq/L (ref 19–32)
Chloride: 107 mEq/L (ref 96–112)
Creatinine, Ser: 0.8 mg/dL (ref 0.4–1.2)
Glucose, Bld: 108 mg/dL — ABNORMAL HIGH (ref 70–99)
Potassium: 4.6 mEq/L (ref 3.5–5.1)

## 2010-11-05 LAB — LIPID PANEL
Cholesterol: 165 mg/dL (ref 0–200)
Total CHOL/HDL Ratio: 4
VLDL: 49.4 mg/dL — ABNORMAL HIGH (ref 0.0–40.0)

## 2010-11-05 LAB — LDL CHOLESTEROL, DIRECT: Direct LDL: 85.6 mg/dL

## 2010-11-13 ENCOUNTER — Encounter: Payer: Self-pay | Admitting: Internal Medicine

## 2010-11-13 ENCOUNTER — Ambulatory Visit (INDEPENDENT_AMBULATORY_CARE_PROVIDER_SITE_OTHER): Payer: Medicare Other | Admitting: Internal Medicine

## 2010-11-13 DIAGNOSIS — R7309 Other abnormal glucose: Secondary | ICD-10-CM

## 2010-11-13 DIAGNOSIS — R202 Paresthesia of skin: Secondary | ICD-10-CM

## 2010-11-13 DIAGNOSIS — R739 Hyperglycemia, unspecified: Secondary | ICD-10-CM

## 2010-11-13 DIAGNOSIS — M545 Low back pain: Secondary | ICD-10-CM

## 2010-11-13 DIAGNOSIS — I10 Essential (primary) hypertension: Secondary | ICD-10-CM

## 2010-11-13 DIAGNOSIS — M810 Age-related osteoporosis without current pathological fracture: Secondary | ICD-10-CM

## 2010-11-13 DIAGNOSIS — R209 Unspecified disturbances of skin sensation: Secondary | ICD-10-CM

## 2010-11-13 MED ORDER — VALSARTAN 320 MG PO TABS: 320.0000 mg | ORAL_TABLET | Freq: Every day | ORAL | Status: DC

## 2010-11-13 MED ORDER — GABAPENTIN 100 MG PO CAPS: 100.0000 mg | ORAL_CAPSULE | Freq: Three times a day (TID) | ORAL | Status: DC | PRN

## 2010-11-13 MED ORDER — MELOXICAM 15 MG PO TABS: 15.0000 mg | ORAL_TABLET | Freq: Every day | ORAL | Status: DC | PRN

## 2010-11-13 MED ORDER — HYDROCHLOROTHIAZIDE 12.5 MG PO CAPS
12.5000 mg | ORAL_CAPSULE | ORAL | Status: DC
Start: 2010-11-13 — End: 2011-07-10

## 2010-11-13 MED ORDER — TEMAZEPAM 30 MG PO CAPS: 60.0000 mg | ORAL_CAPSULE | Freq: Every evening | ORAL | Status: DC | PRN

## 2010-11-13 MED ORDER — ATENOLOL 100 MG PO TABS: 100.0000 mg | ORAL_TABLET | Freq: Two times a day (BID) | ORAL | Status: DC

## 2010-11-13 MED ORDER — SIMVASTATIN 40 MG PO TABS: 40.0000 mg | ORAL_TABLET | Freq: Every day | ORAL | Status: DC

## 2010-11-13 NOTE — Progress Notes (Signed)
  Subjective:    Patient ID: Sandra [name], female    DOB: 1928-07-28, 77 y.o.   MRN: [id number]  HPI  C/o LBP x months, worse with standing - pain is 8/10. Pelvis xray w/DJD. Ibuprofen did not help. F/u HTN, anxiety. C/o burning and tingling in B feet - worse at hs.  Review of Systems  Constitutional: Positive for activity change. Negative for fatigue.  HENT: Negative for neck stiffness.   Eyes: Negative for redness.  Respiratory: Negative for shortness of breath.   Cardiovascular: Negative for leg swelling.  Genitourinary: Negative for flank pain.  Musculoskeletal: Positive for back pain. Negative for joint swelling.  Neurological: Negative for tremors and weakness.  Psychiatric/Behavioral: Negative for decreased concentration.       Objective:   Physical Exam  Constitutional: She appears well-developed and well-nourished. No distress.  HENT:  Head: Normocephalic.  Right Ear: External ear normal.  Left Ear: External ear normal.  Nose: Nose normal.  Mouth/Throat: Oropharynx is clear and moist.  Eyes: Conjunctivae are normal. Pupils are equal, round, and reactive to light. Right eye exhibits no discharge. Left eye exhibits no discharge.  Neck: Normal range of motion. Neck supple. No JVD present. No tracheal deviation present. No thyromegaly present.  Cardiovascular: Normal rate, regular rhythm and normal heart sounds.   Pulmonary/Chest: No stridor. No respiratory distress. She has no wheezes.  Abdominal: Soft. Bowel sounds are normal. She exhibits no distension and no mass. There is no tenderness. There is no rebound and no guarding.  Musculoskeletal: She exhibits tenderness (LS pain w/ROM). She exhibits no edema.  Lymphadenopathy:    She has no cervical adenopathy.  Neurological: She displays normal reflexes. No cranial nerve deficit. She exhibits normal muscle tone. Coordination normal.  Skin: No rash noted. No erythema.  Psychiatric: She has a normal mood and affect. Her  behavior is normal. Judgment and thought content normal.        Lab Results  Component Value Date   WBC 5.1 11/06/2008   HGB 13.3 11/06/2008   HCT 38.5 11/06/2008   PLT 169.0 11/06/2008   CHOL 165 11/05/2010   TRIG 247.0* 11/05/2010   HDL 42.40 11/05/2010   LDLDIRECT 85.6 11/05/2010   ALT 18 11/06/2008   AST 15 11/06/2008   NA 143 11/05/2010   K 4.6 11/05/2010   CL 107 11/05/2010   CREATININE 0.8 11/05/2010   BUN 15 11/05/2010   CO2 29 11/05/2010   TSH 1.71 11/06/2008   HGBA1C 6.3 11/06/2008   MICROALBUR 1.7 11/06/2008     Assessment & Plan:  HYPERTENSION On Rx BP Readings from Last 3 Encounters:  11/13/10 146/88  04/17/10 160/98  06/15/09 172/104     LOW BACK PAIN OA - worse. Stretches. See meds  OSTEOPOROSIS Check Vit D

## 2010-11-13 NOTE — Assessment & Plan Note (Signed)
OA - worse. Stretches. See meds

## 2010-11-13 NOTE — Assessment & Plan Note (Signed)
Check Vit D 

## 2010-11-13 NOTE — Assessment & Plan Note (Signed)
On Rx BP Readings from Last 3 Encounters:  11/13/10 146/88  04/17/10 160/98  06/15/09 172/104

## 2010-11-14 ENCOUNTER — Encounter: Payer: Self-pay | Admitting: Internal Medicine

## 2010-11-19 NOTE — Assessment & Plan Note (Signed)
[facility]                                 ON-CALL NOTE   NAME:[name], Sandra                          MRN:          [id number]  DATE:09/26/2007                            DOB:          August 11, 1928    The patient a month ago had something hit the lower part of her leg and  had a significant bruise but no cuts and it did turn different colors.  However, over the last week she has had some increasing redness about  the area and soreness without any discharge, streaking or fever.  She  stated the original problem was 1 x 2 inches and now it is up 2-1/2  inches more going up her leg.  I recommended she use warm compresses,  take her temperature and if over the next number of hours it is  increasing in size or in pain severity to seek care in the emergency  room.  Otherwise, see her doctor tomorrow or if her leg swells up she  will go to the emergency room also.     Neta Mends. Panosh, MD  Electronically Signed    WKP/MedQ  DD: 09/26/2007  DT: 09/26/2007  Job #: [id number]

## 2010-11-19 NOTE — Assessment & Plan Note (Signed)
[facility]                           PRIMARY CARE OFFICE NOTE   NAME:[name], [name]                        MRN:          [id number]  DATE:12/14/2006                            DOB:          Oct 08, 1928    The patient is a 77 year old female who presents for a wellness  examination.   ALLERGIES:  PENICILLIN, SULFA, LOPID, and ASPIRIN causes cramps and  stomach discomfort.   PAST SURGICAL HISTORY:  Partial hysterectomy.   PAST MEDICAL HISTORY:  Hypertension, insomnia, anxiety, dyslipidemia,  osteoporosis, history of colon polyps, history of hemorrhoids.   CURRENT MEDICATIONS:  1. Temazepam 30 mg 2 at night.  2. Lorazepam 1 mg b.i.d. p.r.n. daytime.  3. Atenolol 100 mg daily.  4. Amitriptyline 25 mg nightly.  5. Vytorin. She has been out of this lately   REVIEW OF SYSTEMS:  No chest pain or shortness of breath. No syncope. No  neurologic complaints. Some respiratory illness lately with purulent  nasal discharge. There was also some blood in the stool. The rest of the  18 point review of systems is negative.   PHYSICAL EXAMINATION:  GENERAL:  She is in no acute distress. She looks  well.  VITAL SIGNS:  Blood pressure 168/77, pulse 66, temperature 98.1, weight  150 pounds.  HEENT: With erythematous throat.  NECK:  Supple, no thyromegaly or bruit.  LUNGS:  Clear. No wheezes or rales.  HEART:  S1, S2, no murmur or gallop.  ABDOMEN:  Soft and nontender, no organomegaly, or masses.  LOWER EXTREMITIES:  Without edema.  NEUROLOGIC:  She is alert and cooperative. She denies being depressed.  SKIN:  Clear.  BREASTS:  Symmetric nipples without discharge. No masses.   EKG normal.   ASSESSMENT AND PLAN:  1. Normal wellness examination.  The patient's lifestyle discussed,      she is due a mammogram. We will order. Zostavax information      provided. Mammogram to schedule. Calcium and vitamin D daily. She      is status post partial hysterectomy. She  had an ultrasound last      year. Obtain CA-125 with a waiver. Other options for ovarian cancer      screen discussed as well.  2. URI. Z-Pak 5 days prescribed.  3. Dyslipidemia. Discontinue Vytorin. Start Zocor 40 mg daily due to      cost.  4. Elevated blood pressure.  She will check at home and call me if      elevated.  5. Blood in stool. GI consultation with Dr. Matthias [name].     Sandra Quint. Plotnikov, MD  Electronically Signed    AVP/MedQ  DD: 12/19/2006  DT: 12/20/2006  Job #: [id number]   cc:   Sandra [name], M.D.

## 2010-12-24 ENCOUNTER — Telehealth: Payer: Self-pay

## 2010-12-24 NOTE — Telephone Encounter (Signed)
rec rf req for Lorazepam 1 mg 1 po bid. # 60 last filled 11-22-10  And temazepam 30mg  2 po qhs # 60. Last filled  11-22-10. Ok to Rf?

## 2010-12-24 NOTE — Telephone Encounter (Signed)
Pt called requesting a call back regarding Rx refills.

## 2010-12-26 ENCOUNTER — Telehealth: Payer: Self-pay | Admitting: *Deleted

## 2010-12-26 NOTE — Telephone Encounter (Signed)
Pt calling to get refill on Lorazepam and temazepam. [facility] [address] Please advise refill.

## 2010-12-26 NOTE — Telephone Encounter (Signed)
OK to fill this prescription with additional refills x1 sch OV Thank you!  

## 2010-12-27 MED ORDER — TEMAZEPAM 30 MG PO CAPS: 60.0000 mg | ORAL_CAPSULE | Freq: Every evening | ORAL | Status: DC | PRN

## 2010-12-27 MED ORDER — LORAZEPAM 1 MG PO TABS: 1.0000 mg | ORAL_TABLET | Freq: Two times a day (BID) | ORAL | Status: DC

## 2010-12-27 NOTE — Telephone Encounter (Signed)
OK to fill both prescriptions with additional refills x3 Thank you!  

## 2011-01-06 ENCOUNTER — Other Ambulatory Visit (INDEPENDENT_AMBULATORY_CARE_PROVIDER_SITE_OTHER): Payer: Medicare Other

## 2011-01-06 ENCOUNTER — Other Ambulatory Visit (INDEPENDENT_AMBULATORY_CARE_PROVIDER_SITE_OTHER): Payer: Medicare Other | Admitting: Internal Medicine

## 2011-01-06 DIAGNOSIS — R209 Unspecified disturbances of skin sensation: Secondary | ICD-10-CM

## 2011-01-06 DIAGNOSIS — R202 Paresthesia of skin: Secondary | ICD-10-CM

## 2011-01-06 DIAGNOSIS — R739 Hyperglycemia, unspecified: Secondary | ICD-10-CM

## 2011-01-06 DIAGNOSIS — M545 Low back pain, unspecified: Secondary | ICD-10-CM

## 2011-01-06 DIAGNOSIS — R7309 Other abnormal glucose: Secondary | ICD-10-CM

## 2011-01-06 DIAGNOSIS — Z0000 Encounter for general adult medical examination without abnormal findings: Secondary | ICD-10-CM

## 2011-01-06 DIAGNOSIS — M810 Age-related osteoporosis without current pathological fracture: Secondary | ICD-10-CM

## 2011-01-06 LAB — VITAMIN D 25 HYDROXY (VIT D DEFICIENCY, FRACTURES): Vit D, 25-Hydroxy: 30 ng/mL (ref 30–89)

## 2011-01-06 LAB — SEDIMENTATION RATE: Sed Rate: 4 mm/hr (ref 0–22)

## 2011-01-06 LAB — URINALYSIS, ROUTINE W REFLEX MICROSCOPIC
Bilirubin Urine: NEGATIVE
Hgb urine dipstick: NEGATIVE
Ketones, ur: NEGATIVE
Nitrite: NEGATIVE

## 2011-01-06 LAB — VITAMIN B12: Vitamin B-12: 1357 pg/mL — ABNORMAL HIGH (ref 211–911)

## 2011-01-06 LAB — TSH: TSH: 2.41 u[IU]/mL (ref 0.35–5.50)

## 2011-01-06 LAB — HEMOGLOBIN A1C: Hgb A1c MFr Bld: 6.5 % (ref 4.6–6.5)

## 2011-01-13 ENCOUNTER — Ambulatory Visit (INDEPENDENT_AMBULATORY_CARE_PROVIDER_SITE_OTHER): Payer: Medicare Other | Admitting: Internal Medicine

## 2011-01-13 ENCOUNTER — Encounter: Payer: Self-pay | Admitting: Internal Medicine

## 2011-01-13 DIAGNOSIS — F4321 Adjustment disorder with depressed mood: Secondary | ICD-10-CM

## 2011-01-13 DIAGNOSIS — F4320 Adjustment disorder, unspecified: Secondary | ICD-10-CM | POA: Insufficient documentation

## 2011-01-13 DIAGNOSIS — R079 Chest pain, unspecified: Secondary | ICD-10-CM

## 2011-01-13 MED ORDER — GABAPENTIN 100 MG PO CAPS: 100.0000 mg | ORAL_CAPSULE | Freq: Three times a day (TID) | ORAL | Status: DC | PRN

## 2011-01-13 MED ORDER — MELOXICAM 15 MG PO TABS: 15.0000 mg | ORAL_TABLET | Freq: Every day | ORAL | Status: DC | PRN

## 2011-01-13 MED ORDER — LORAZEPAM 1 MG PO TABS: 1.0000 mg | ORAL_TABLET | Freq: Two times a day (BID) | ORAL | Status: DC

## 2011-01-13 MED ORDER — TEMAZEPAM 30 MG PO CAPS: 60.0000 mg | ORAL_CAPSULE | Freq: Every evening | ORAL | Status: DC | PRN

## 2011-01-13 MED ORDER — VALSARTAN 320 MG PO TABS: 320.0000 mg | ORAL_TABLET | Freq: Every day | ORAL | Status: DC

## 2011-01-13 MED ORDER — SIMVASTATIN 40 MG PO TABS: 40.0000 mg | ORAL_TABLET | Freq: Every day | ORAL | Status: DC

## 2011-01-13 NOTE — Progress Notes (Signed)
  Subjective:    Patient ID: Sandra [name], female    DOB: Nov 14, 1928, 77 y.o.   MRN: [id number]  HPI  The patient presents for a follow-up of  chronic hypertension, chronic dyslipidemia, dyslipidemia controlled with medicines Her sister died - grieving  Review of Systems  Constitutional: Negative for chills, activity change, appetite change, fatigue and unexpected weight change.  HENT: Negative for congestion, mouth sores and sinus pressure.   Eyes: Negative for visual disturbance.  Respiratory: Negative for cough and chest tightness.   Cardiovascular: Positive for chest pain (L sided - nonexertional).  Gastrointestinal: Negative for nausea and abdominal pain.  Genitourinary: Negative for frequency, difficulty urinating and vaginal pain.  Musculoskeletal: Negative for back pain and gait problem.  Skin: Negative for pallor and rash.  Neurological: Negative for dizziness, tremors, weakness, numbness and headaches.  Psychiatric/Behavioral: Positive for sleep disturbance. Negative for suicidal ideas and confusion. The patient is nervous/anxious.        Objective:   Physical Exam  Constitutional: She appears well-developed and well-nourished. No distress.  HENT:  Head: Normocephalic.  Right Ear: External ear normal.  Left Ear: External ear normal.  Nose: Nose normal.  Mouth/Throat: Oropharynx is clear and moist.  Eyes: Conjunctivae are normal. Pupils are equal, round, and reactive to light. Right eye exhibits no discharge. Left eye exhibits no discharge.  Neck: Normal range of motion. Neck supple. No JVD present. No tracheal deviation present. No thyromegaly present.  Cardiovascular: Normal rate, regular rhythm and normal heart sounds.   Pulmonary/Chest: No stridor. No respiratory distress. She has no wheezes.  Abdominal: Soft. Bowel sounds are normal. She exhibits no distension and no mass. There is no tenderness. There is no rebound and no guarding.  Musculoskeletal: She exhibits  no edema and no tenderness.  Lymphadenopathy:    She has no cervical adenopathy.  Neurological: She displays normal reflexes. No cranial nerve deficit. She exhibits normal muscle tone. Coordination normal.  Skin: No rash noted. No erythema.  Psychiatric: Her behavior is normal. Judgment and thought content normal.       sad          Assessment & Plan:

## 2011-01-13 NOTE — Assessment & Plan Note (Signed)
EKG is nl She declined Card consult, w/up

## 2011-01-13 NOTE — Assessment & Plan Note (Signed)
Discussed.

## 2011-03-13 ENCOUNTER — Encounter: Payer: Self-pay | Admitting: Internal Medicine

## 2011-03-31 ENCOUNTER — Ambulatory Visit (INDEPENDENT_AMBULATORY_CARE_PROVIDER_SITE_OTHER): Payer: Medicare Other | Admitting: *Deleted

## 2011-03-31 DIAGNOSIS — Z23 Encounter for immunization: Secondary | ICD-10-CM

## 2011-03-31 IMAGING — CR DG CHEST 2V
2 series · 2 of 2 positions shown · non-contrast
Comparison: 07/19/2008

CLINICAL DATA: Cough, chest pain, shortness of breath.

CHEST - 2 VIEW

[view not recorded (1 of 2)]
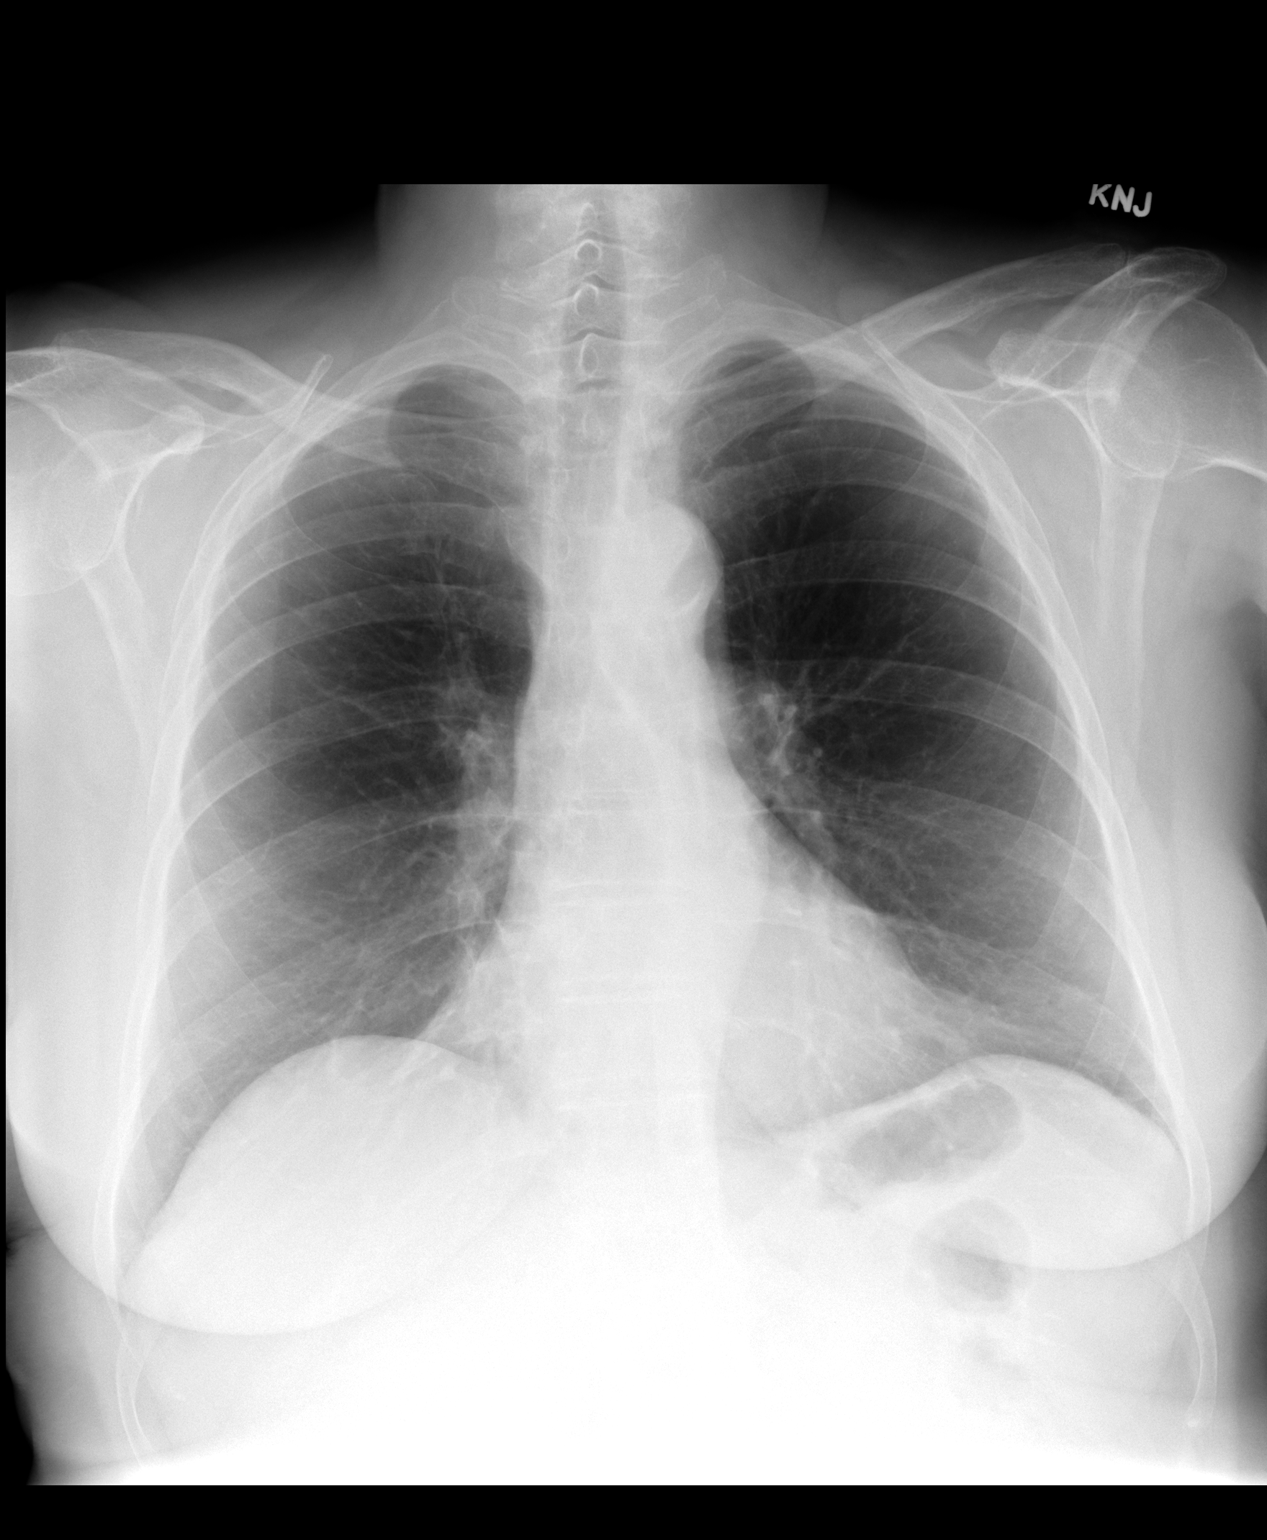

[view not recorded (2 of 2)]
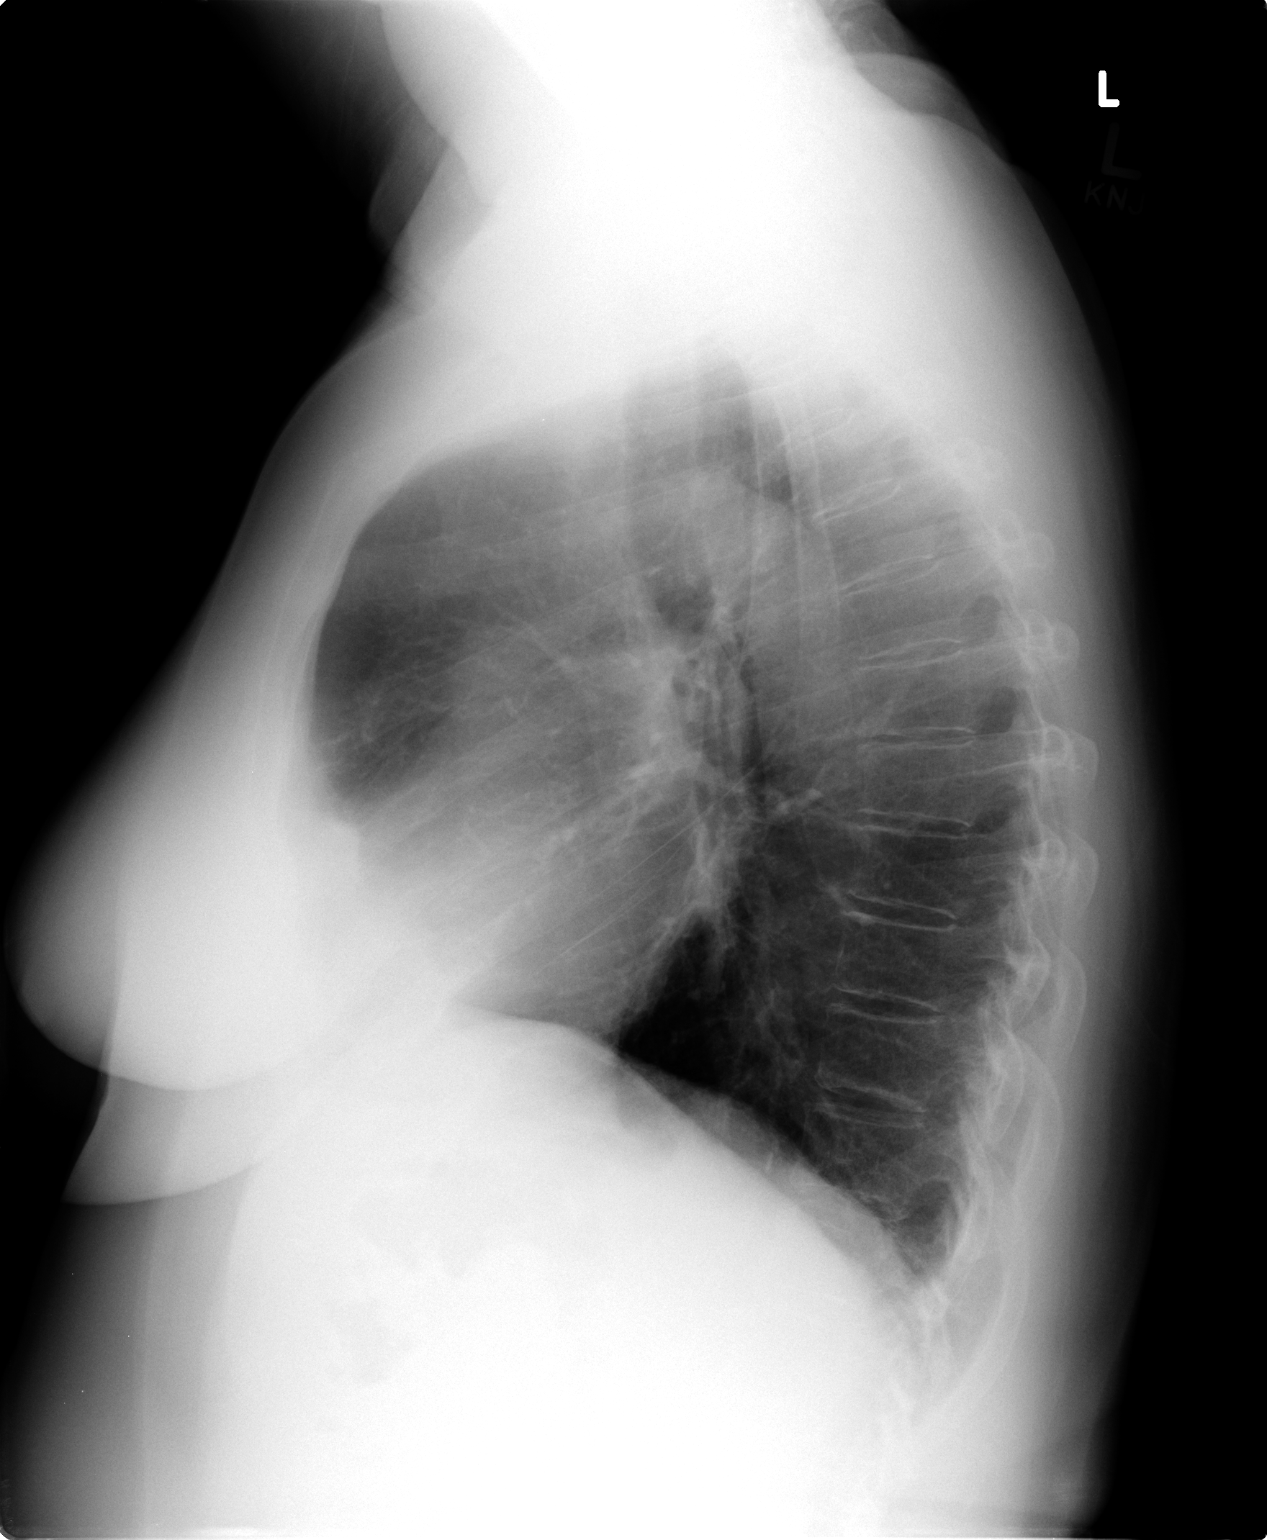

[2 of 2 positions shown; findings below may reference images not displayed]

FINDINGS: Heart and mediastinal contours are within normal limits.
No focal opacities or effusions.  No acute bony abnormality.
IMPRESSION: No acute findings.

## 2011-06-23 ENCOUNTER — Ambulatory Visit (INDEPENDENT_AMBULATORY_CARE_PROVIDER_SITE_OTHER): Payer: Medicare Other | Admitting: Endocrinology

## 2011-06-23 ENCOUNTER — Encounter: Payer: Self-pay | Admitting: Endocrinology

## 2011-06-23 ENCOUNTER — Telehealth: Payer: Self-pay

## 2011-06-23 ENCOUNTER — Other Ambulatory Visit (INDEPENDENT_AMBULATORY_CARE_PROVIDER_SITE_OTHER): Payer: Medicare Other

## 2011-06-23 VITALS — BP 142/78 | HR 72 | Temp 98.7°F | Ht 62.0 in | Wt 154.4 lb

## 2011-06-23 DIAGNOSIS — R51 Headache: Secondary | ICD-10-CM

## 2011-06-23 LAB — CBC WITH DIFFERENTIAL/PLATELET
Basophils Relative: 0.7 % (ref 0.0–3.0)
Eosinophils Relative: 0.7 % (ref 0.0–5.0)
HCT: 39.9 % (ref 36.0–46.0)
Lymphs Abs: 2.2 10*3/uL (ref 0.7–4.0)
MCV: 96.1 fl (ref 78.0–100.0)
Monocytes Absolute: 0.7 10*3/uL (ref 0.1–1.0)
Neutrophils Relative %: 58.5 % (ref 43.0–77.0)
RBC: 4.15 Mil/uL (ref 3.87–5.11)
WBC: 7.2 10*3/uL (ref 4.5–10.5)

## 2011-06-23 LAB — BASIC METABOLIC PANEL
Chloride: 107 mEq/L (ref 96–112)
Potassium: 4.3 mEq/L (ref 3.5–5.1)

## 2011-06-23 MED ORDER — HYDROCODONE-ACETAMINOPHEN 10-325 MG PO TABS: 1.0000 | ORAL_TABLET | Freq: Four times a day (QID) | ORAL | Status: AC | PRN

## 2011-06-23 NOTE — Progress Notes (Signed)
Subjective:    Patient ID: Sandra [name], female    DOB: 1928/09/30, 77 y.o.   MRN: [id number]  HPI Few weeks of pain at right parietal area, and assoc numbness of the hands and feet.   Past Medical History  Diagnosis Date  . Colon polyps   . Hyperlipidemia   . HTN (hypertension)   . Osteoporosis   . Anxiety   . Vitamin D deficiency   . Strabismus     L eye  . LBP (low back pain) 2011    right  . Peripheral neuropathy   . Vitamin B12 deficiency     Past Surgical History  Procedure Date  . Abdominal hysterectomy     History   Social History  . Marital Status: Married    Spouse Name: N/A    Number of Children: N/A  . Years of Education: N/A   Occupational History  . Retired     Clinical biochemist   Social History Main Topics  . Smoking status: Never Smoker   . Smokeless tobacco: Not on file  . Alcohol Use: No  . Drug Use: No  . Sexually Active: No   Other Topics Concern  . Not on file   Social History Narrative   Single widowRetiredOccupation: Customer Service    Current Outpatient Prescriptions on File Prior to Visit  Medication Sig Dispense Refill  . albuterol (PROAIR HFA) 108 (90 BASE) MCG/ACT inhaler Inhale 2 puffs into the lungs every 6 (six) hours as needed. for SOB       . atenolol (TENORMIN) 100 MG tablet Take 1 tablet (100 mg total) by mouth 2 (two) times daily.  60 tablet  11  . Cholecalciferol (VITAMIN D3) 1000 UNITS tablet Take 1,000 Units by mouth daily.        . hydrochlorothiazide (,MICROZIDE/HYDRODIURIL,) 12.5 MG capsule Take 1 capsule (12.5 mg total) by mouth every morning.  30 capsule  11  . LORazepam (ATIVAN) 1 MG tablet Take 1 tablet (1 mg total) by mouth 2 (two) times daily.  60 tablet  5  . simvastatin (ZOCOR) 40 MG tablet Take 1 tablet (40 mg total) by mouth at bedtime.  30 tablet  11  . valsartan (DIOVAN) 320 MG tablet Take 1 tablet (320 mg total) by mouth daily. For blood pressure.  30 tablet  11  . vitamin B-12 (CYANOCOBALAMIN) 1000  MCG tablet Take 1,000 mcg by mouth daily.        . meloxicam (MOBIC) 15 MG tablet Take 1 tablet (15 mg total) by mouth daily as needed for pain.  30 tablet  11  . temazepam (RESTORIL) 30 MG capsule Take 60 mg by mouth at bedtime as needed.          Allergies  Allergen Reactions  . Prednisone Shortness Of Breath and Other (See Comments)    dyspnea  . Aspirin     Upset stomach  . Azithromycin     REACTION: does not work  . Enalapril Maleate     REACTION: cough  . Penicillins     Family History  Problem Relation Age of Onset  . Hypertension    . Heart disease Mother 51    chf  . Early death Father   . Heart disease Father 52    mi  . Diabetes Sister   . Heart disease Sister     chf    BP 142/78  Pulse 72  Temp(Src) 98.7 F (37.1 C) (Oral)  Ht 5'  2" (1.575 m)  Wt 154 lb 6.4 oz (70.035 kg)  BMI 28.24 kg/m2  SpO2 93%    Review of Systems She has nausea, but no fever.      Objective:   Physical Exam VITAL SIGNS:  See vs page GENERAL: no distress head: no deformity.  nontender eyes: no periorbital swelling, no proptosis external nose and ears are normal mouth: no lesion seen Both eac's and tm's are normal Neuro: sensation is intact to touch on the hands.      Lab Results  Component Value Date   WBC 7.2 06/23/2011   HGB 13.6 06/23/2011   HCT 39.9 06/23/2011   PLT 190.0 06/23/2011   GLUCOSE 117* 06/23/2011   CHOL 165 11/05/2010   TRIG 247.0* 11/05/2010   HDL 42.40 11/05/2010   LDLDIRECT 85.6 11/05/2010   LDLCALC 92 11/06/2008   ALT 18 11/06/2008   AST 15 11/06/2008   NA 144 06/23/2011   K 4.3 06/23/2011   CL 107 06/23/2011   CREATININE 1.0 06/23/2011   BUN 21 06/23/2011   CO2 29 06/23/2011   TSH 2.41 01/06/2011   HGBA1C 6.5 01/06/2011   MICROALBUR 1.7 11/06/2008  esr=normal    Assessment & Plan:  Headache, new, uncertain etiology Numbness, uncertain etiology

## 2011-06-23 NOTE — Telephone Encounter (Signed)
Pt called c/o severe RT ear pain causing nausea since yesterday. Pt has tried applying heat to affected ear and oil inside of ear with no improvement. Pt stated she would rather not come for OV due to possible flu exposure, please advise.

## 2011-06-23 NOTE — Telephone Encounter (Signed)
Pt called back requesting OV with first available MD, transferred to schedulers for appt.

## 2011-06-23 NOTE — Patient Instructions (Addendum)
blood tests are being requested for you today.  please call [phone] to hear your test results.  You will be prompted to enter the 9-digit "MRN" number that appears at the top left of this page, followed by #.  Then you will hear the message. I hope you feel better soon.  If you don't feel better by next week, please call your doctor. Here is a prescription for pain medication.  This contains tylenol, so you should not take tylenol with it.   (update: i left message on phone-tree:  rx as we discussed)

## 2011-06-24 ENCOUNTER — Telehealth: Payer: Self-pay

## 2011-06-24 NOTE — Telephone Encounter (Signed)
Pt informed of MD's advisement. Transferred to scheduler to schedule an appt with AVP as requested by pt.

## 2011-06-24 NOTE — Telephone Encounter (Signed)
Pt called stating pain medication Rx'd at OV yesterday do not help with her ear pain. Please advise.

## 2011-06-24 NOTE — Telephone Encounter (Signed)
It i not safe to increase pain medication, in view of your other meds.  Please see dr plotnikov if sxs pertsist.

## 2011-06-25 ENCOUNTER — Encounter: Payer: Self-pay | Admitting: Internal Medicine

## 2011-06-25 ENCOUNTER — Ambulatory Visit (INDEPENDENT_AMBULATORY_CARE_PROVIDER_SITE_OTHER): Payer: Medicare Other | Admitting: Internal Medicine

## 2011-06-25 VITALS — BP 152/82 | HR 68 | Temp 98.4°F | Resp 16 | Wt 151.0 lb

## 2011-06-25 DIAGNOSIS — R51 Headache: Secondary | ICD-10-CM

## 2011-06-25 DIAGNOSIS — I10 Essential (primary) hypertension: Secondary | ICD-10-CM

## 2011-06-25 DIAGNOSIS — M5481 Occipital neuralgia: Secondary | ICD-10-CM

## 2011-06-25 DIAGNOSIS — M531 Cervicobrachial syndrome: Secondary | ICD-10-CM

## 2011-06-25 MED ORDER — ACYCLOVIR 800 MG PO TABS: 800.0000 mg | ORAL_TABLET | Freq: Four times a day (QID) | ORAL | Status: AC

## 2011-06-25 MED ORDER — PREDNISONE 10 MG PO TABS: ORAL_TABLET | ORAL | Status: DC

## 2011-06-25 MED ORDER — GABAPENTIN 100 MG PO CAPS: 100.0000 mg | ORAL_CAPSULE | Freq: Three times a day (TID) | ORAL | Status: DC | PRN

## 2011-06-25 MED ORDER — MELOXICAM 15 MG PO TABS: 15.0000 mg | ORAL_TABLET | Freq: Every day | ORAL | Status: DC | PRN

## 2011-06-29 ENCOUNTER — Encounter: Payer: Self-pay | Admitting: Internal Medicine

## 2011-06-29 NOTE — Progress Notes (Deleted)
  Subjective:    Patient ID: Sandra [name], female    DOB: [birth year], 77 y.o.   MRN: [id number]  HPI   HPI  C/o URI sx's x 2-3. C/o ST, cough, weakness. Not better with OTC medicines. Actually, the patient is getting worse. The patient did not sleep last night due to cough.  Review of Systems  Constitutional: Positive for fever, chills and fatigue.  HENT: Positive for congestion, rhinorrhea, sneezing and postnasal drip.   Eyes: Positive for photophobia and pain. Negative for discharge and visual disturbance.  Respiratory: Positive for cough and wheezing.   Positive for chest pain.  Gastrointestinal: Negative for vomiting, abdominal pain, diarrhea and abdominal distention.  Genitourinary: Negative for dysuria and difficulty urinating.  Skin: Negative for rash.  Neurological: Positive for dizziness, weakness and light-headedness.      Review of Systems     Objective:   Physical Exam        Assessment & Plan:

## 2011-06-29 NOTE — Progress Notes (Signed)
  Subjective:    Patient ID: Sandra [name], female    DOB: 27-Oct-1928, 77 y.o.   MRN: [id number]  HPI  C/o severe R parietal HA and R earache x 1-2 wks; pain comes in spells, jabbing  Review of Systems  Constitutional: Negative for chills, activity change, appetite change, fatigue and unexpected weight change.  HENT: Negative for congestion, mouth sores and sinus pressure.   Eyes: Negative for visual disturbance.  Respiratory: Negative for cough and chest tightness.   Gastrointestinal: Negative for nausea and abdominal pain.  Genitourinary: Negative for frequency, difficulty urinating and vaginal pain.  Musculoskeletal: Negative for back pain and gait problem.  Skin: Negative for pallor and rash.  Neurological: Positive for dizziness and headaches. Negative for tremors, weakness and numbness.  Psychiatric/Behavioral: Negative for suicidal ideas, confusion and sleep disturbance. The patient is not nervous/anxious.        Objective:   Physical Exam  Constitutional: She appears well-developed. No distress.  HENT:  Head: Normocephalic.  Right Ear: External ear normal.  Left Ear: External ear normal.  Nose: Nose normal.  Mouth/Throat: Oropharynx is clear and moist.  Eyes: Conjunctivae are normal. Pupils are equal, round, and reactive to light. Right eye exhibits no discharge. Left eye exhibits no discharge.  Neck: Normal range of motion. Neck supple. No JVD present. No tracheal deviation present. No thyromegaly present.  Cardiovascular: Normal rate, regular rhythm and normal heart sounds.   Pulmonary/Chest: No stridor. No respiratory distress. She has no wheezes.  Abdominal: Soft. Bowel sounds are normal. She exhibits no distension and no mass. There is no tenderness. There is no rebound and no guarding.  Musculoskeletal: She exhibits tenderness. She exhibits no edema.       R occip area is tender to palp  Lymphadenopathy:    She has no cervical adenopathy.  Neurological: She  displays normal reflexes. No cranial nerve deficit. She exhibits normal muscle tone. Coordination normal.  Skin: No rash noted. No erythema.  Psychiatric: She has a normal mood and affect. Her behavior is normal. Judgment and thought content normal.          Assessment & Plan:

## 2011-06-29 NOTE — Assessment & Plan Note (Addendum)
R occip area - poss occip nerve neuralgia See Meds Empiric Acyclovir Labs

## 2011-06-29 NOTE — Assessment & Plan Note (Signed)
BP Readings from Last 3 Encounters:  06/25/11 152/82  06/23/11 142/78  01/13/11 158/86  Monitoring BP

## 2011-07-10 ENCOUNTER — Encounter: Payer: Self-pay | Admitting: Internal Medicine

## 2011-07-10 ENCOUNTER — Ambulatory Visit (INDEPENDENT_AMBULATORY_CARE_PROVIDER_SITE_OTHER): Payer: Medicare Other | Admitting: Internal Medicine

## 2011-07-10 DIAGNOSIS — M5481 Occipital neuralgia: Secondary | ICD-10-CM

## 2011-07-10 DIAGNOSIS — M531 Cervicobrachial syndrome: Secondary | ICD-10-CM

## 2011-07-10 DIAGNOSIS — I10 Essential (primary) hypertension: Secondary | ICD-10-CM

## 2011-07-10 DIAGNOSIS — R51 Headache: Secondary | ICD-10-CM

## 2011-07-10 MED ORDER — TEMAZEPAM 30 MG PO CAPS: 60.0000 mg | ORAL_CAPSULE | Freq: Every evening | ORAL | Status: DC | PRN

## 2011-07-10 MED ORDER — SIMVASTATIN 40 MG PO TABS: 40.0000 mg | ORAL_TABLET | Freq: Every day | ORAL | Status: DC

## 2011-07-10 MED ORDER — MELOXICAM 15 MG PO TABS: 15.0000 mg | ORAL_TABLET | Freq: Every day | ORAL | Status: DC | PRN

## 2011-07-10 MED ORDER — HYDROCODONE-ACETAMINOPHEN 7.5-325 MG PO TABS: 1.0000 | ORAL_TABLET | Freq: Four times a day (QID) | ORAL | Status: AC | PRN

## 2011-07-10 MED ORDER — ATENOLOL 100 MG PO TABS: 100.0000 mg | ORAL_TABLET | Freq: Two times a day (BID) | ORAL | Status: DC

## 2011-07-10 MED ORDER — LORAZEPAM 1 MG PO TABS: 1.0000 mg | ORAL_TABLET | Freq: Two times a day (BID) | ORAL | Status: DC

## 2011-07-10 MED ORDER — VALSARTAN 320 MG PO TABS: 320.0000 mg | ORAL_TABLET | Freq: Every day | ORAL | Status: DC

## 2011-07-10 MED ORDER — TRIAMTERENE-HCTZ 37.5-25 MG PO TABS: 1.0000 | ORAL_TABLET | Freq: Every day | ORAL | Status: DC

## 2011-07-10 NOTE — Assessment & Plan Note (Signed)
12/12 R occip neuralgia 80% better

## 2011-07-10 NOTE — Assessment & Plan Note (Addendum)
Continue with current prescription therapy as reflected on the Med list. Check BP at home - call if elevated

## 2011-07-10 NOTE — Progress Notes (Signed)
  Subjective:    Patient ID: Sandra [name], female    DOB: 06-26-1929, 78 y.o.   MRN: [id number]  HPI  F/u R occipital neuralgia 80 % better F/u HTN   Review of Systems     Objective:   Physical Exam        Assessment & Plan:

## 2011-07-10 NOTE — Assessment & Plan Note (Signed)
80% better See Meds

## 2011-08-22 ENCOUNTER — Telehealth: Payer: Self-pay | Admitting: Internal Medicine

## 2011-08-22 MED ORDER — TEMAZEPAM 30 MG PO CAPS: 60.0000 mg | ORAL_CAPSULE | Freq: Every evening | ORAL | Status: DC | PRN

## 2011-08-22 NOTE — Telephone Encounter (Signed)
Pt was given written Rx at 07-10-11 OV for Temazepam 1 po bid # 30. Ok to refill with # 60?

## 2011-08-22 NOTE — Telephone Encounter (Signed)
Pt states temazepam 30 mg was cut in half from what she usual take-- Pt desire 60 mg--Pt ph# [phone]--Pharm--[facility] [address]

## 2011-08-22 NOTE — Telephone Encounter (Signed)
Ok per AVP to change to # 60. Pt informed

## 2011-11-25 ENCOUNTER — Other Ambulatory Visit: Payer: Self-pay | Admitting: Internal Medicine

## 2011-12-26 ENCOUNTER — Telehealth: Payer: Self-pay | Admitting: Internal Medicine

## 2011-12-26 NOTE — Telephone Encounter (Signed)
Left message on machine for pt to return my call  

## 2011-12-26 NOTE — Telephone Encounter (Signed)
ER CALL. Caller: Oval/Patient; PCP: Sonda Primes; CB#: [phone]; ; ; Call regarding Pain in Head radiating into Lips, onset 1 week, pain worsen on 6-20, "hard to wash face".  PT has numbness on R side of Lips radiating down to R Hand, onset 6-21. Advised Pt to go to ED d/t new onset of numbness on R side and irregular fast HR, Pt refused ED, wants appt at office.

## 2011-12-26 NOTE — Telephone Encounter (Signed)
Caller: Sandra [name]/Patient; PCP: Sonda Primes; CB#: [phone];  Call regarding Skin Sensitive To Touch onset 06/17; States "it is the most excrutiating pain, from top of head, down to lips only on the right side.   Has cried due to pain.  Denies any rash, lesions, blisters.  Afebrile.  Emergent sx negative.  Care advice per "Skin Lesions" with appt advised within 24h due to "burning/tingling feeling in localized area on one side of body."   Per [facility], unable to schedule any "same day" appts.   OFFICE PLEASE CALL [name] AND ASSIST WITH SAME DAY APPT.  THANKS.

## 2011-12-26 NOTE — Telephone Encounter (Signed)
I return call to patient and advised ER as well. Pt declined ER, and Saturday clinic appt. Pt states that pt is not as bad as described in previous message and she prefers to make OV with AVP. Pt advised to go to ER or UC is pain worsens, she agreed.

## 2011-12-29 ENCOUNTER — Other Ambulatory Visit (INDEPENDENT_AMBULATORY_CARE_PROVIDER_SITE_OTHER): Payer: Medicare Other

## 2011-12-29 ENCOUNTER — Ambulatory Visit (INDEPENDENT_AMBULATORY_CARE_PROVIDER_SITE_OTHER): Payer: Medicare Other | Admitting: Internal Medicine

## 2011-12-29 ENCOUNTER — Encounter: Payer: Self-pay | Admitting: Internal Medicine

## 2011-12-29 VITALS — BP 180/90 | HR 84 | Temp 98.0°F | Resp 16 | Wt 150.0 lb

## 2011-12-29 DIAGNOSIS — E538 Deficiency of other specified B group vitamins: Secondary | ICD-10-CM

## 2011-12-29 DIAGNOSIS — M5481 Occipital neuralgia: Secondary | ICD-10-CM

## 2011-12-29 DIAGNOSIS — R51 Headache: Secondary | ICD-10-CM

## 2011-12-29 DIAGNOSIS — M531 Cervicobrachial syndrome: Secondary | ICD-10-CM

## 2011-12-29 DIAGNOSIS — E559 Vitamin D deficiency, unspecified: Secondary | ICD-10-CM

## 2011-12-29 LAB — BASIC METABOLIC PANEL
Chloride: 106 mEq/L (ref 96–112)
Potassium: 3.4 mEq/L — ABNORMAL LOW (ref 3.5–5.1)
Sodium: 143 mEq/L (ref 135–145)

## 2011-12-29 LAB — CBC WITH DIFFERENTIAL/PLATELET
Basophils Relative: 0.5 % (ref 0.0–3.0)
Eosinophils Relative: 0.8 % (ref 0.0–5.0)
HCT: 39.8 % (ref 36.0–46.0)
Hemoglobin: 13.3 g/dL (ref 12.0–15.0)
Lymphs Abs: 2.1 10*3/uL (ref 0.7–4.0)
MCV: 96.3 fl (ref 78.0–100.0)
Monocytes Absolute: 0.6 10*3/uL (ref 0.1–1.0)
Neutro Abs: 3.9 10*3/uL (ref 1.4–7.7)
Neutrophils Relative %: 58.5 % (ref 43.0–77.0)
RBC: 4.14 Mil/uL (ref 3.87–5.11)
WBC: 6.7 10*3/uL (ref 4.5–10.5)

## 2011-12-29 MED ORDER — GABAPENTIN 100 MG PO CAPS: ORAL_CAPSULE | ORAL | Status: DC

## 2011-12-29 MED ORDER — ATENOLOL 100 MG PO TABS: 100.0000 mg | ORAL_TABLET | Freq: Two times a day (BID) | ORAL | Status: DC

## 2011-12-29 MED ORDER — TEMAZEPAM 30 MG PO CAPS: 60.0000 mg | ORAL_CAPSULE | Freq: Every evening | ORAL | Status: DC | PRN

## 2011-12-29 MED ORDER — MELOXICAM 15 MG PO TABS: 15.0000 mg | ORAL_TABLET | Freq: Every day | ORAL | Status: DC | PRN

## 2011-12-29 MED ORDER — TRAMADOL HCL 50 MG PO TABS: 50.0000 mg | ORAL_TABLET | Freq: Two times a day (BID) | ORAL | Status: AC | PRN

## 2011-12-29 MED ORDER — LOSARTAN POTASSIUM 100 MG PO TABS: 100.0000 mg | ORAL_TABLET | Freq: Every day | ORAL | Status: DC

## 2011-12-29 MED ORDER — ACYCLOVIR 800 MG PO TABS: 800.0000 mg | ORAL_TABLET | Freq: Every day | ORAL | Status: DC

## 2011-12-29 MED ORDER — LORAZEPAM 1 MG PO TABS: 1.0000 mg | ORAL_TABLET | Freq: Two times a day (BID) | ORAL | Status: DC

## 2011-12-29 MED ORDER — SIMVASTATIN 40 MG PO TABS: 40.0000 mg | ORAL_TABLET | Freq: Every day | ORAL | Status: DC

## 2011-12-29 NOTE — Progress Notes (Deleted)
  Subjective:    Patient ID: Sandra [name], female    DOB: Apr 13, 1929, 78 y.o.   MRN: [id number]  HPI  C/o R HA, face pain and numbness - worse with touching  x 2-3 wks  Review of Systems     Objective:   Physical Exam        Assessment & Plan:

## 2011-12-29 NOTE — Telephone Encounter (Signed)
Agree, I can see her on Thur 6/27, in pm if needed Thx

## 2011-12-29 NOTE — Assessment & Plan Note (Signed)
Labs

## 2011-12-29 NOTE — Assessment & Plan Note (Signed)
R/o zoster Acyclovir, Predn, Gaba Labs RTC 2 wks

## 2011-12-30 LAB — VITAMIN B12: Vitamin B-12: >1500 pg/mL — ABNORMAL HIGH (ref 211–911)

## 2012-01-02 ENCOUNTER — Telehealth: Payer: Self-pay | Admitting: Internal Medicine

## 2012-01-02 ENCOUNTER — Ambulatory Visit: Payer: Medicare Other | Admitting: Internal Medicine

## 2012-01-02 NOTE — Telephone Encounter (Signed)
Yes. She can take tramadol tid if needed too Thx

## 2012-01-02 NOTE — Telephone Encounter (Signed)
Caller: Recie/Mother; PCP: Sonda Primes; CB#: [phone]; ; ; Call regarding Can Take ES Tylenol in between  Tramadol doses Pt calling because she was seen in office on yesterday and wants to know if physician is ok with her taking 2 ES Tylenol between her Tramadol doses. She is taking Tramadol twice a day.

## 2012-01-02 NOTE — Telephone Encounter (Signed)
Pt informed

## 2012-01-05 ENCOUNTER — Encounter: Payer: Self-pay | Admitting: Internal Medicine

## 2012-01-05 NOTE — Assessment & Plan Note (Signed)
Continue with current prescription therapy as reflected on the Med list.  

## 2012-01-05 NOTE — Assessment & Plan Note (Signed)
See meds 

## 2012-01-05 NOTE — Progress Notes (Signed)
Patient ID: Sandra [name], female   DOB: [birth year], 78 y.o.   MRN: [id number]  Subjective:    Patient ID: Sandra [name], female    DOB: 1928/12/14, 78 y.o.   MRN: [id number]  HPI  C/o severe R parietal HA and R earache x2 wks; pain comes in spells, jabbing - similar to what she had  6 mo ago  F/u HTN, B12 def  BP Readings from Last 3 Encounters:  12/29/11 180/90  07/10/11 150/80  06/25/11 152/82   Wt Readings from Last 3 Encounters:  12/29/11 150 lb (68.04 kg)  07/10/11 153 lb (69.4 kg)  06/25/11 151 lb (68.493 kg)      Review of Systems  Constitutional: Negative for chills, activity change, appetite change, fatigue and unexpected weight change.  HENT: Negative for congestion, mouth sores and sinus pressure.   Eyes: Negative for visual disturbance.  Respiratory: Negative for cough and chest tightness.   Gastrointestinal: Negative for nausea and abdominal pain.  Genitourinary: Negative for frequency, difficulty urinating and vaginal pain.  Musculoskeletal: Negative for back pain and gait problem.  Skin: Negative for pallor and rash.  Neurological: Positive for dizziness and headaches. Negative for tremors, weakness and numbness.  Psychiatric/Behavioral: Negative for suicidal ideas, confusion and disturbed wake/sleep cycle. The patient is not nervous/anxious.        Objective:   Physical Exam  Constitutional: She appears well-developed. No distress.  HENT:  Head: Normocephalic.  Right Ear: External ear normal.  Left Ear: External ear normal.  Nose: Nose normal.  Mouth/Throat: Oropharynx is clear and moist.  Eyes: Conjunctivae are normal. Pupils are equal, round, and reactive to light. Right eye exhibits no discharge. Left eye exhibits no discharge.  Neck: Normal range of motion. Neck supple. No JVD present. No tracheal deviation present. No thyromegaly present.  Cardiovascular: Normal rate, regular rhythm and normal heart sounds.   Pulmonary/Chest: No stridor. No  respiratory distress. She has no wheezes.  Abdominal: Soft. Bowel sounds are normal. She exhibits no distension and no mass. There is no tenderness. There is no rebound and no guarding.  Musculoskeletal: She exhibits tenderness. She exhibits no edema.       R occip area is tender to palp  Lymphadenopathy:    She has no cervical adenopathy.  Neurological: She displays normal reflexes. No cranial nerve deficit. She exhibits normal muscle tone. Coordination normal.  Skin: No rash noted. No erythema.  Psychiatric: She has a normal mood and affect. Her behavior is normal. Judgment and thought content normal.    Lab Results  Component Value Date   WBC 6.7 12/29/2011   HGB 13.3 12/29/2011   HCT 39.8 12/29/2011   PLT 177.0 12/29/2011   GLUCOSE 137* 12/29/2011   CHOL 165 11/05/2010   TRIG 247.0* 11/05/2010   HDL 42.40 11/05/2010   LDLDIRECT 85.6 11/05/2010   LDLCALC 92 11/06/2008   ALT 18 11/06/2008   AST 15 11/06/2008   NA 143 12/29/2011   K 3.4* 12/29/2011   CL 106 12/29/2011   CREATININE 0.8 12/29/2011   BUN 15 12/29/2011   CO2 28 12/29/2011   TSH 2.41 01/06/2011   HGBA1C 6.5 01/06/2011   MICROALBUR 1.7 11/06/2008         Assessment & Plan:

## 2012-01-12 ENCOUNTER — Other Ambulatory Visit: Payer: Self-pay | Admitting: *Deleted

## 2012-01-12 ENCOUNTER — Encounter: Payer: Self-pay | Admitting: Internal Medicine

## 2012-01-12 ENCOUNTER — Ambulatory Visit (INDEPENDENT_AMBULATORY_CARE_PROVIDER_SITE_OTHER): Payer: Medicare Other | Admitting: Internal Medicine

## 2012-01-12 VITALS — BP 160/90 | HR 80 | Temp 98.6°F | Resp 16 | Wt 149.0 lb

## 2012-01-12 DIAGNOSIS — R260 Ataxic gait: Secondary | ICD-10-CM

## 2012-01-12 DIAGNOSIS — I10 Essential (primary) hypertension: Secondary | ICD-10-CM

## 2012-01-12 DIAGNOSIS — E538 Deficiency of other specified B group vitamins: Secondary | ICD-10-CM

## 2012-01-12 DIAGNOSIS — M5481 Occipital neuralgia: Secondary | ICD-10-CM

## 2012-01-12 DIAGNOSIS — R269 Unspecified abnormalities of gait and mobility: Secondary | ICD-10-CM

## 2012-01-12 DIAGNOSIS — R7309 Other abnormal glucose: Secondary | ICD-10-CM

## 2012-01-12 DIAGNOSIS — M531 Cervicobrachial syndrome: Secondary | ICD-10-CM

## 2012-01-12 DIAGNOSIS — E785 Hyperlipidemia, unspecified: Secondary | ICD-10-CM

## 2012-01-12 DIAGNOSIS — R51 Headache: Secondary | ICD-10-CM

## 2012-01-12 NOTE — Assessment & Plan Note (Signed)
MRI brain -- r/o CVA, tumor

## 2012-01-12 NOTE — Assessment & Plan Note (Signed)
Better Continue with current prescription therapy as reflected on the Med list.  

## 2012-01-12 NOTE — Assessment & Plan Note (Signed)
75% better Continue with current prescription therapy as reflected on the Med list.

## 2012-01-12 NOTE — Assessment & Plan Note (Signed)
Continue with current prescription therapy as reflected on the Med list.  

## 2012-01-12 NOTE — Progress Notes (Signed)
  Subjective:    Patient ID: Sandra [name], female    DOB: 10-16-1928, 78 y.o.   MRN: [id number]  HPI  C/o severe R parietal HA and R earache x2 wks; pain comes in spells, jabbing - similar to what she had  6 mo ago -now it is 75 % better  C/o staggering and feeling drunk x 2 wks. Off Gaba and Tramadol x 3 d - not better  F/u HTN, B12 def  BP Readings from Last 3 Encounters:  01/12/12 160/90  12/29/11 180/90  07/10/11 150/80   Wt Readings from Last 3 Encounters:  01/12/12 149 lb (67.586 kg)  12/29/11 150 lb (68.04 kg)  07/10/11 153 lb (69.4 kg)      Review of Systems  Constitutional: Negative for chills, activity change, appetite change, fatigue and unexpected weight change.  HENT: Negative for congestion, mouth sores and sinus pressure.   Eyes: Negative for visual disturbance.  Respiratory: Negative for cough and chest tightness.   Gastrointestinal: Negative for nausea and abdominal pain.  Genitourinary: Negative for frequency, difficulty urinating and vaginal pain.  Musculoskeletal: Negative for back pain and gait problem.  Skin: Negative for pallor and rash.  Neurological: Positive for dizziness and headaches. Negative for tremors, weakness and numbness.  Psychiatric/Behavioral: Negative for suicidal ideas, confusion and disturbed wake/sleep cycle. The patient is not nervous/anxious.        Objective:   Physical Exam  Constitutional: She appears well-developed. No distress.  HENT:  Head: Normocephalic.  Right Ear: External ear normal.  Left Ear: External ear normal.  Nose: Nose normal.  Mouth/Throat: Oropharynx is clear and moist.  Eyes: Conjunctivae are normal. Pupils are equal, round, and reactive to light. Right eye exhibits no discharge. Left eye exhibits no discharge.  Neck: Normal range of motion. Neck supple. No JVD present. No tracheal deviation present. No thyromegaly present.  Cardiovascular: Normal rate, regular rhythm and normal heart sounds.     Pulmonary/Chest: No stridor. No respiratory distress. She has no wheezes.  Abdominal: Soft. Bowel sounds are normal. She exhibits no distension and no mass. There is no tenderness. There is no rebound and no guarding.  Musculoskeletal: She exhibits tenderness. She exhibits no edema.       R occip area is tender to palp  Lymphadenopathy:    She has no cervical adenopathy.  Neurological: She displays normal reflexes. No cranial nerve deficit. She exhibits normal muscle tone. Coordination normal.  Skin: No rash noted. No erythema.  Psychiatric: She has a normal mood and affect. Her behavior is normal. Judgment and thought content normal.  No pron drift, unable to do heel to toe walk  Lab Results  Component Value Date   WBC 6.7 12/29/2011   HGB 13.3 12/29/2011   HCT 39.8 12/29/2011   PLT 177.0 12/29/2011   GLUCOSE 137* 12/29/2011   CHOL 165 11/05/2010   TRIG 247.0* 11/05/2010   HDL 42.40 11/05/2010   LDLDIRECT 85.6 11/05/2010   LDLCALC 92 11/06/2008   ALT 18 11/06/2008   AST 15 11/06/2008   NA 143 12/29/2011   K 3.4* 12/29/2011   CL 106 12/29/2011   CREATININE 0.8 12/29/2011   BUN 15 12/29/2011   CO2 28 12/29/2011   TSH 2.41 01/06/2011   HGBA1C 6.5 01/06/2011   MICROALBUR 1.7 11/06/2008         Assessment & Plan:

## 2012-01-12 NOTE — Assessment & Plan Note (Signed)
Labs

## 2012-01-14 ENCOUNTER — Ambulatory Visit
Admission: RE | Admit: 2012-01-14 | Discharge: 2012-01-14 | Disposition: A | Discharge status: Home / Self Care | Payer: Medicare Other | Source: Ambulatory Visit | Attending: Internal Medicine | Admitting: Internal Medicine

## 2012-01-14 DIAGNOSIS — R260 Ataxic gait: Secondary | ICD-10-CM

## 2012-01-14 DIAGNOSIS — M5481 Occipital neuralgia: Secondary | ICD-10-CM

## 2012-01-14 MED ORDER — GADOBENATE DIMEGLUMINE 529 MG/ML IV SOLN
14.0000 mL | Freq: Once | INTRAVENOUS | Status: AC | PRN
  Administered 2012-01-14: 14 mL via INTRAVENOUS

## 2012-01-15 ENCOUNTER — Telehealth: Payer: Self-pay | Admitting: Internal Medicine

## 2012-01-15 NOTE — Telephone Encounter (Signed)
Sandra [name], please, inform patient that her MRI was ok overall - there is hardening of blood vessels present Thx

## 2012-01-15 NOTE — Telephone Encounter (Signed)
Pt informed

## 2012-01-16 ENCOUNTER — Telehealth: Payer: Self-pay

## 2012-01-16 NOTE — Telephone Encounter (Signed)
BP Readings from Last 3 Encounters:  01/12/12 160/90  12/29/11 180/90  07/10/11 150/80   Take another Triamt-HCTZ if BP is still up OV this next wk   Thx

## 2012-01-16 NOTE — Telephone Encounter (Signed)
Pt called to inform MD that at another MD's office today her BP was elevated at 196/106. Pt denied any associated sxs but states that she was advised to inform PCP.

## 2012-01-19 MED ORDER — TRIAMTERENE-HCTZ 37.5-25 MG PO TABS: 1.0000 | ORAL_TABLET | Freq: Every day | ORAL | Status: DC

## 2012-01-19 NOTE — Telephone Encounter (Signed)
Pt informed

## 2012-01-20 ENCOUNTER — Ambulatory Visit (INDEPENDENT_AMBULATORY_CARE_PROVIDER_SITE_OTHER): Payer: Medicare Other | Admitting: Internal Medicine

## 2012-01-20 ENCOUNTER — Encounter: Payer: Self-pay | Admitting: Internal Medicine

## 2012-01-20 VITALS — BP 190/98 | HR 80 | Temp 98.6°F | Resp 16 | Wt 148.0 lb

## 2012-01-20 DIAGNOSIS — E538 Deficiency of other specified B group vitamins: Secondary | ICD-10-CM

## 2012-01-20 DIAGNOSIS — M5481 Occipital neuralgia: Secondary | ICD-10-CM

## 2012-01-20 DIAGNOSIS — R270 Ataxia, unspecified: Secondary | ICD-10-CM

## 2012-01-20 DIAGNOSIS — R279 Unspecified lack of coordination: Secondary | ICD-10-CM

## 2012-01-20 DIAGNOSIS — M531 Cervicobrachial syndrome: Secondary | ICD-10-CM

## 2012-01-20 DIAGNOSIS — I10 Essential (primary) hypertension: Secondary | ICD-10-CM

## 2012-01-20 MED ORDER — TRIAMTERENE-HCTZ 75-50 MG PO TABS: 1.0000 | ORAL_TABLET | Freq: Every day | ORAL | Status: AC

## 2012-01-20 MED ORDER — CLOPIDOGREL BISULFATE 75 MG PO TABS: 75.0000 mg | ORAL_TABLET | Freq: Every day | ORAL | Status: DC

## 2012-01-20 NOTE — Progress Notes (Signed)
Patient ID: Sandra [name], female   DOB: 1928-12-27, 78 y.o.   MRN: [id number]  Subjective:    Patient ID: Sandra [name], female    DOB: 1929/06/22, 78 y.o.   MRN: [id number]  Hypertension Associated symptoms include headaches.    F/u severe R parietal HA and R earache - better C/o staggering and feeling drunk x 4 wks. Off Gaba and Tramadol x 3 d - not better - restarted  F/u HTN, B12 def  BP Readings from Last 3 Encounters:  01/20/12 190/98  01/12/12 160/90  12/29/11 180/90   Wt Readings from Last 3 Encounters:  01/20/12 148 lb (67.132 kg)  01/12/12 149 lb (67.586 kg)  12/29/11 150 lb (68.04 kg)      Review of Systems  Constitutional: Negative for chills, activity change, appetite change, fatigue and unexpected weight change.  HENT: Negative for congestion, mouth sores and sinus pressure.   Eyes: Negative for visual disturbance.  Respiratory: Negative for cough and chest tightness.   Gastrointestinal: Negative for nausea and abdominal pain.  Genitourinary: Negative for frequency, difficulty urinating and vaginal pain.  Musculoskeletal: Negative for back pain and gait problem.  Skin: Negative for pallor and rash.  Neurological: Positive for dizziness and headaches. Negative for tremors, weakness and numbness.  Psychiatric/Behavioral: Negative for suicidal ideas, confusion and disturbed wake/sleep cycle. The patient is not nervous/anxious.        Objective:   Physical Exam  Constitutional: She appears well-developed. No distress.  HENT:  Head: Normocephalic.  Right Ear: External ear normal.  Left Ear: External ear normal.  Nose: Nose normal.  Mouth/Throat: Oropharynx is clear and moist.  Eyes: Conjunctivae are normal. Pupils are equal, round, and reactive to light. Right eye exhibits no discharge. Left eye exhibits no discharge.  Neck: Normal range of motion. Neck supple. No JVD present. No tracheal deviation present. No thyromegaly present.  Cardiovascular: Normal  rate, regular rhythm and normal heart sounds.   Pulmonary/Chest: No stridor. No respiratory distress. She has no wheezes.  Abdominal: Soft. Bowel sounds are normal. She exhibits no distension and no mass. There is no tenderness. There is no rebound and no guarding.  Musculoskeletal: She exhibits tenderness. She exhibits no edema.       R occip area is tender to palp  Lymphadenopathy:    She has no cervical adenopathy.  Neurological: She displays normal reflexes. No cranial nerve deficit. She exhibits normal muscle tone. Coordination normal.  Skin: No rash noted. No erythema.  Psychiatric: She has a normal mood and affect. Her behavior is normal. Judgment and thought content normal.  No pron drift, unable to do heel to toe walk - ataxic  Lab Results  Component Value Date   WBC 6.7 12/29/2011   HGB 13.3 12/29/2011   HCT 39.8 12/29/2011   PLT 177.0 12/29/2011   GLUCOSE 137* 12/29/2011   CHOL 165 11/05/2010   TRIG 247.0* 11/05/2010   HDL 42.40 11/05/2010   LDLDIRECT 85.6 11/05/2010   LDLCALC 92 11/06/2008   ALT 18 11/06/2008   AST 15 11/06/2008   NA 143 12/29/2011   K 3.4* 12/29/2011   CL 106 12/29/2011   CREATININE 0.8 12/29/2011   BUN 15 12/29/2011   CO2 28 12/29/2011   TSH 2.41 01/06/2011   HGBA1C 6.5 01/06/2011   MICROALBUR 1.7 11/06/2008         Assessment & Plan:

## 2012-01-20 NOTE — Assessment & Plan Note (Signed)
Increase Maxzide Declined Nephrology consult

## 2012-01-20 NOTE — Assessment & Plan Note (Signed)
Better Continue with current prescription therapy as reflected on the Med list.  

## 2012-01-20 NOTE — Assessment & Plan Note (Addendum)
Continue with current prescription therapy as reflected on the Med list. She declined PT, Neurol consult Start Plavix 7/13 - poss a small CVA (MRI was ok 7/13)

## 2012-01-20 NOTE — Assessment & Plan Note (Signed)
Continue with current prescription therapy as reflected on the Med list.  

## 2012-01-21 ENCOUNTER — Telehealth: Payer: Self-pay

## 2012-01-21 DIAGNOSIS — R42 Dizziness and giddiness: Secondary | ICD-10-CM

## 2012-01-21 DIAGNOSIS — R270 Ataxia, unspecified: Secondary | ICD-10-CM

## 2012-01-21 NOTE — Telephone Encounter (Signed)
Pt called stating that she would like to proceed with referral as discussed at OV 07/16

## 2012-01-23 NOTE — Telephone Encounter (Signed)
PT ref 

## 2012-01-26 ENCOUNTER — Telehealth: Payer: Self-pay | Admitting: *Deleted

## 2012-01-26 DIAGNOSIS — R270 Ataxia, unspecified: Secondary | ICD-10-CM

## 2012-01-26 NOTE — Telephone Encounter (Signed)
I would move colonosc a month or two from now Thx

## 2012-01-26 NOTE — Telephone Encounter (Signed)
Pt called asking for MD's advisement on whether she should have colonoscopy done this upcoming Friday and also to see if MD would have specialist read her MRI as discussed in OV last week.

## 2012-01-27 NOTE — Telephone Encounter (Signed)
Pt informed of MD's advisement. 

## 2012-01-30 ENCOUNTER — Other Ambulatory Visit: Payer: Self-pay

## 2012-01-30 NOTE — Telephone Encounter (Signed)
Pt called requesting to refill Zovirax. Pt states that occipital neuralgia has returned.

## 2012-02-04 MED ORDER — ACYCLOVIR 800 MG PO TABS: 800.0000 mg | ORAL_TABLET | Freq: Every day | ORAL | Status: AC

## 2012-02-04 NOTE — Telephone Encounter (Signed)
Called the patient and she does not want prescription to be sent in at this time.  She is seeing a specialist at this time and would like to wait to see what he has to say. Put hardcopy on CMA's desk to discard.

## 2012-02-10 ENCOUNTER — Ambulatory Visit: Payer: Medicare Other | Admitting: Internal Medicine

## 2012-02-16 ENCOUNTER — Telehealth: Payer: Self-pay | Admitting: Internal Medicine

## 2012-02-16 NOTE — Telephone Encounter (Signed)
Caller: Sandra [name]/Patient;  PCP: Sonda Primes; Best Callback Phone Number: [phone].  Pt states "I take 4 pills every morning, they are making me so nauseated, I can't stand it."  The Losartan Potassium is a new medicine, has increased dosage of one of her morning medicines, cannot remember which one.  Nausea onset "when started these 4 pills, approximately since 08/05"  Afebrile.  Emergent sx negative.  Care advice per "Nausea" with disposition to call provider within 24 hours due to "symptoms began after starting or changing dose of prescription medicine."  RN suggested taking 2 pills, wait 30 minutes, then take the other 2 pills, both times with food.  Pt states "I do not take them on an empty stomach." PLEASE FOLLOW UP WITH MS Hardge FOR ASSISTANCE.  THANKS.

## 2012-02-16 NOTE — Telephone Encounter (Signed)
Noted - agree w/plan  Is she taking a new medicine given by her neurologist at the last visit? Is it making her sick?  Take Losartan at hs Thx

## 2012-02-17 NOTE — Telephone Encounter (Signed)
Noted. Agree Thx! 

## 2012-02-17 NOTE — Telephone Encounter (Signed)
Pt informed and states that the only new medication is Losartan. Pt advised to take hs to help with nausea sxs. Pt agreed and will call back PRN.

## 2012-03-02 ENCOUNTER — Ambulatory Visit: Payer: Medicare Other | Admitting: Internal Medicine

## 2012-03-09 ENCOUNTER — Encounter: Payer: Self-pay | Admitting: Internal Medicine

## 2012-03-09 ENCOUNTER — Ambulatory Visit (INDEPENDENT_AMBULATORY_CARE_PROVIDER_SITE_OTHER)
Admission: RE | Admit: 2012-03-09 | Discharge: 2012-03-09 | Disposition: A | Discharge status: Home / Self Care | Payer: Medicare Other | Source: Ambulatory Visit | Attending: Internal Medicine | Admitting: Internal Medicine

## 2012-03-09 ENCOUNTER — Ambulatory Visit (INDEPENDENT_AMBULATORY_CARE_PROVIDER_SITE_OTHER): Payer: Medicare Other | Admitting: Internal Medicine

## 2012-03-09 ENCOUNTER — Telehealth: Payer: Self-pay | Admitting: Internal Medicine

## 2012-03-09 ENCOUNTER — Other Ambulatory Visit (INDEPENDENT_AMBULATORY_CARE_PROVIDER_SITE_OTHER): Payer: Medicare Other

## 2012-03-09 VITALS — BP 140/76 | HR 80 | Temp 98.6°F | Resp 16 | Wt 148.0 lb

## 2012-03-09 DIAGNOSIS — R509 Fever, unspecified: Secondary | ICD-10-CM

## 2012-03-09 DIAGNOSIS — R279 Unspecified lack of coordination: Secondary | ICD-10-CM

## 2012-03-09 DIAGNOSIS — I10 Essential (primary) hypertension: Secondary | ICD-10-CM

## 2012-03-09 DIAGNOSIS — R270 Ataxia, unspecified: Secondary | ICD-10-CM

## 2012-03-09 DIAGNOSIS — M5481 Occipital neuralgia: Secondary | ICD-10-CM

## 2012-03-09 DIAGNOSIS — E538 Deficiency of other specified B group vitamins: Secondary | ICD-10-CM

## 2012-03-09 DIAGNOSIS — M531 Cervicobrachial syndrome: Secondary | ICD-10-CM

## 2012-03-09 LAB — URINALYSIS, ROUTINE W REFLEX MICROSCOPIC
Hgb urine dipstick: NEGATIVE
Ketones, ur: NEGATIVE
Total Protein, Urine: NEGATIVE
Urine Glucose: NEGATIVE
Urobilinogen, UA: 0.2 (ref 0.0–1.0)

## 2012-03-09 MED ORDER — LEVOFLOXACIN 500 MG PO TABS: 500.0000 mg | ORAL_TABLET | Freq: Every day | ORAL | Status: AC

## 2012-03-09 MED ORDER — PROMETHAZINE-CODEINE 6.25-10 MG/5ML PO SYRP: 5.0000 mL | ORAL_SOLUTION | ORAL | Status: AC | PRN

## 2012-03-09 NOTE — Assessment & Plan Note (Signed)
Better   Will obtain a neurology f/up

## 2012-03-09 NOTE — Assessment & Plan Note (Signed)
Continue with current prescription therapy as reflected on the Med list.  

## 2012-03-09 NOTE — Patient Instructions (Addendum)
Hold Simvastatin an Meloxicam x 1-2 wks please

## 2012-03-09 NOTE — Assessment & Plan Note (Signed)
Not better - chonic Will obtain a neurology f/up

## 2012-03-09 NOTE — Progress Notes (Signed)
Subjective:    Patient ID: Sandra [name], female    DOB: 1928-09-01, 78 y.o.   MRN: [id number]  Cough This is a new problem. The current episode started 1 to 4 weeks ago. The problem has been gradually worsening. The problem occurs every few minutes. The cough is non-productive. Associated symptoms include chest pain, a fever and headaches. Pertinent negatives include no rash. She has tried nothing for the symptoms. The treatment provided no relief. Her past medical history is significant for bronchitis.  Fever  This is a new problem. The current episode started 1 to 4 weeks ago. The maximum temperature noted was 100 to 100.9 F. Associated symptoms include chest pain, coughing and headaches. Pertinent negatives include no abdominal pain, congestion, nausea or rash. The treatment provided mild relief.  Hypertension This is a chronic problem. Associated symptoms include chest pain and headaches.    F/u severe R parietal HA and R earache - better (5/10); no vasculitis F/u  staggering and feeling drunk x 4 wks. Off Gaba and Tramadol x 3 d - not better   F/u HTN, B12 def  BP Readings from Last 3 Encounters:  03/09/12 140/76  01/20/12 190/98  01/12/12 160/90   Wt Readings from Last 3 Encounters:  03/09/12 148 lb (67.132 kg)  01/20/12 148 lb (67.132 kg)  01/12/12 149 lb (67.586 kg)      Review of Systems  Constitutional: Positive for fever. Negative for activity change, appetite change, fatigue and unexpected weight change.  HENT: Negative for congestion, mouth sores and sinus pressure.   Eyes: Negative for visual disturbance.  Respiratory: Positive for cough. Negative for chest tightness.   Cardiovascular: Positive for chest pain.  Gastrointestinal: Negative for nausea and abdominal pain.  Genitourinary: Negative for frequency, difficulty urinating and vaginal pain.  Musculoskeletal: Negative for back pain and gait problem.  Skin: Negative for pallor and rash.  Neurological:  Positive for dizziness and headaches. Negative for tremors, weakness and numbness.  Psychiatric/Behavioral: Negative for suicidal ideas, confusion and disturbed wake/sleep cycle. The patient is not nervous/anxious.        Objective:   Physical Exam  Constitutional: She appears well-developed. No distress.  HENT:  Head: Normocephalic.  Right Ear: External ear normal.  Left Ear: External ear normal.  Nose: Nose normal.  Mouth/Throat: Oropharynx is clear and moist.  Eyes: Conjunctivae are normal. Pupils are equal, round, and reactive to light. Right eye exhibits no discharge. Left eye exhibits no discharge.  Neck: Normal range of motion. Neck supple. No JVD present. No tracheal deviation present. No thyromegaly present.  Cardiovascular: Normal rate, regular rhythm and normal heart sounds.   Pulmonary/Chest: No stridor. No respiratory distress. She has no wheezes.  Abdominal: Soft. Bowel sounds are normal. She exhibits no distension and no mass. There is no tenderness. There is no rebound and no guarding.  Musculoskeletal: She exhibits tenderness. She exhibits no edema.       R occip area is tender to palp  Lymphadenopathy:    She has no cervical adenopathy.  Neurological: She displays normal reflexes. No cranial nerve deficit. She exhibits normal muscle tone. Coordination normal.  Skin: No rash noted. No erythema.  Psychiatric: She has a normal mood and affect. Her behavior is normal. Judgment and thought content normal.  No pron drift, unable to do heel to toe walk - ataxic  Lab Results  Component Value Date   WBC 6.7 12/29/2011   HGB 13.3 12/29/2011   HCT 39.8 12/29/2011  PLT 177.0 12/29/2011   GLUCOSE 137* 12/29/2011   CHOL 165 11/05/2010   TRIG 247.0* 11/05/2010   HDL 42.40 11/05/2010   LDLDIRECT 85.6 11/05/2010   LDLCALC 92 11/06/2008   ALT 18 11/06/2008   AST 15 11/06/2008   NA 143 12/29/2011   K 3.4* 12/29/2011   CL 106 12/29/2011   CREATININE 0.8 12/29/2011   BUN 15 12/29/2011   CO2 28  12/29/2011   TSH 2.41 01/06/2011   HGBA1C 6.5 01/06/2011   MICROALBUR 1.7 11/06/2008         Assessment & Plan:

## 2012-03-09 NOTE — Telephone Encounter (Signed)
Caller: Suezette/Patient; Patient Name: Sandra [name]; PCP: Sonda Primes (Adults only); Best Callback Phone Number: [phone]; Onset of cold symptoms started about 2 weeks ago; Current temperature is 99 orally ; but it has gone up to 101 orally each for past 2 weeks.  Has tried over the counter medications for cough and cold without improvement; No appetite-has to make self eat.  Is able to drink.  Drainage is clear.  Triaged using Upper Respiratory Infection with a disposition to call Provider Immediately due to fever in a frail individual. Per nursing judgement schedule appointment to be seen; Care Advice Given; Appointment made for 03/09/12 @ 16:30 With Dr. Posey Rea. Patient demonstrated understanding.

## 2012-03-10 ENCOUNTER — Telehealth: Payer: Self-pay | Admitting: Internal Medicine

## 2012-03-10 NOTE — Telephone Encounter (Signed)
Sandra [name], please, inform patient that her UA ws OK Lungs x ray was suggestive of lung infection. Pls take the abx prescribed Thx

## 2012-03-10 NOTE — Telephone Encounter (Signed)
Pt informed

## 2012-04-23 ENCOUNTER — Other Ambulatory Visit: Payer: Self-pay | Admitting: Internal Medicine

## 2012-04-23 NOTE — Telephone Encounter (Signed)
Ok to Rf? 

## 2012-04-26 ENCOUNTER — Ambulatory Visit (INDEPENDENT_AMBULATORY_CARE_PROVIDER_SITE_OTHER): Payer: Medicare Other | Admitting: Internal Medicine

## 2012-04-26 ENCOUNTER — Encounter: Payer: Self-pay | Admitting: Internal Medicine

## 2012-04-26 VITALS — BP 132/82 | HR 75 | Temp 98.4°F | Resp 14 | Ht 62.0 in | Wt 144.0 lb

## 2012-04-26 DIAGNOSIS — I10 Essential (primary) hypertension: Secondary | ICD-10-CM

## 2012-04-26 DIAGNOSIS — L259 Unspecified contact dermatitis, unspecified cause: Secondary | ICD-10-CM

## 2012-04-26 DIAGNOSIS — L309 Dermatitis, unspecified: Secondary | ICD-10-CM | POA: Insufficient documentation

## 2012-04-26 DIAGNOSIS — M531 Cervicobrachial syndrome: Secondary | ICD-10-CM

## 2012-04-26 DIAGNOSIS — M5481 Occipital neuralgia: Secondary | ICD-10-CM

## 2012-04-26 DIAGNOSIS — Z23 Encounter for immunization: Secondary | ICD-10-CM

## 2012-04-26 DIAGNOSIS — E538 Deficiency of other specified B group vitamins: Secondary | ICD-10-CM

## 2012-04-26 MED ORDER — TRIAMCINOLONE ACETONIDE 0.5 % EX OINT: TOPICAL_OINTMENT | Freq: Three times a day (TID) | CUTANEOUS | Status: DC

## 2012-04-26 MED ORDER — LORAZEPAM 1 MG PO TABS: 1.0000 mg | ORAL_TABLET | Freq: Two times a day (BID) | ORAL | Status: DC | PRN

## 2012-04-26 NOTE — Patient Instructions (Addendum)
Put lotion on your skin before shower

## 2012-04-26 NOTE — Assessment & Plan Note (Signed)
10/13 dry skin eczema

## 2012-04-26 NOTE — Progress Notes (Signed)
   Subjective:    Patient ID: Sandra [name], female    DOB: 1928/09/18, 78 y.o.   MRN: [id number]  HPI  C/o rash - elbows and behind the knees x 2 wks -itchy  F/u HTN, B12 def  BP Readings from Last 3 Encounters:  04/26/12 132/82  03/09/12 140/76  01/20/12 190/98   Wt Readings from Last 3 Encounters:  04/26/12 144 lb (65.318 kg)  03/09/12 148 lb (67.132 kg)  01/20/12 148 lb (67.132 kg)      Review of Systems  Constitutional: Negative for chills, activity change, appetite change, fatigue and unexpected weight change.  HENT: Negative for congestion, mouth sores and sinus pressure.   Eyes: Negative for visual disturbance.  Respiratory: Negative for cough and chest tightness.   Gastrointestinal: Negative for nausea and abdominal pain.  Genitourinary: Negative for frequency, difficulty urinating and vaginal pain.  Musculoskeletal: Negative for back pain and gait problem.  Skin: Negative for pallor and rash.  Neurological: Positive for dizziness. Negative for tremors, weakness and numbness.  Psychiatric/Behavioral: Negative for suicidal ideas, confusion and disturbed wake/sleep cycle. The patient is not nervous/anxious.        Objective:   Physical Exam  Constitutional: She appears well-developed. No distress.  HENT:  Head: Normocephalic.  Right Ear: External ear normal.  Left Ear: External ear normal.  Nose: Nose normal.  Mouth/Throat: Oropharynx is clear and moist.  Eyes: Conjunctivae normal are normal. Pupils are equal, round, and reactive to light. Right eye exhibits no discharge. Left eye exhibits no discharge.  Neck: Normal range of motion. Neck supple. No JVD present. No tracheal deviation present. No thyromegaly present.  Cardiovascular: Normal rate, regular rhythm and normal heart sounds.   Pulmonary/Chest: No stridor. No respiratory distress. She has no wheezes.  Abdominal: Soft. Bowel sounds are normal. She exhibits no distension and no mass. There is no  tenderness. There is no rebound and no guarding.  Musculoskeletal: She exhibits tenderness. She exhibits no edema.       R occip area is tender to palp  Lymphadenopathy:    She has no cervical adenopathy.  Neurological: She displays normal reflexes. No cranial nerve deficit. She exhibits normal muscle tone. Coordination normal.  Skin: No rash noted. No erythema.  Psychiatric: She has a normal mood and affect. Her behavior is normal. Judgment and thought content normal.  No pron drift, unable to do heel to toe walk - ataxic  Lab Results  Component Value Date   WBC 6.7 12/29/2011   HGB 13.3 12/29/2011   HCT 39.8 12/29/2011   PLT 177.0 12/29/2011   GLUCOSE 137* 12/29/2011   CHOL 165 11/05/2010   TRIG 247.0* 11/05/2010   HDL 42.40 11/05/2010   LDLDIRECT 85.6 11/05/2010   LDLCALC 92 11/06/2008   ALT 18 11/06/2008   AST 15 11/06/2008   NA 143 12/29/2011   K 3.4* 12/29/2011   CL 106 12/29/2011   CREATININE 0.8 12/29/2011   BUN 15 12/29/2011   CO2 28 12/29/2011   TSH 2.41 01/06/2011   HGBA1C 6.5 01/06/2011   MICROALBUR 1.7 11/06/2008         Assessment & Plan:

## 2012-04-27 ENCOUNTER — Encounter: Payer: Self-pay | Admitting: Internal Medicine

## 2012-04-27 ENCOUNTER — Ambulatory Visit: Payer: Medicare Other

## 2012-04-27 NOTE — Assessment & Plan Note (Signed)
Better Continue with current prescription therapy as reflected on the Med list.  

## 2012-04-27 NOTE — Assessment & Plan Note (Signed)
Continue with current prescription therapy as reflected on the Med list.  

## 2012-05-17 ENCOUNTER — Encounter: Payer: Self-pay | Admitting: Internal Medicine

## 2012-07-20 ENCOUNTER — Telehealth: Payer: Self-pay | Admitting: Internal Medicine

## 2012-07-21 NOTE — Telephone Encounter (Signed)
Ok to Rf? 

## 2012-07-26 ENCOUNTER — Telehealth: Payer: Self-pay | Admitting: Internal Medicine

## 2012-07-26 MED ORDER — TEMAZEPAM 30 MG PO CAPS: 60.0000 mg | ORAL_CAPSULE | Freq: Every evening | ORAL | Status: DC | PRN

## 2012-07-26 NOTE — Telephone Encounter (Signed)
Done. Pt informed.

## 2012-07-26 NOTE — Telephone Encounter (Signed)
Pt states she called several times last week for Restoril refill.  Upset that when she calls [facility] it is not there.   Please call her when it is ready.

## 2012-07-26 NOTE — Telephone Encounter (Signed)
OK to fill this prescription with additional refills x5 Thank you!  

## 2012-08-24 ENCOUNTER — Other Ambulatory Visit: Payer: Self-pay | Admitting: Internal Medicine

## 2012-09-22 ENCOUNTER — Other Ambulatory Visit: Payer: Self-pay | Admitting: Internal Medicine

## 2012-11-02 ENCOUNTER — Telehealth: Payer: Self-pay | Admitting: Internal Medicine

## 2012-11-02 DIAGNOSIS — E785 Hyperlipidemia, unspecified: Secondary | ICD-10-CM

## 2012-11-02 DIAGNOSIS — I10 Essential (primary) hypertension: Secondary | ICD-10-CM

## 2012-11-02 DIAGNOSIS — R7309 Other abnormal glucose: Secondary | ICD-10-CM

## 2012-11-02 NOTE — Telephone Encounter (Signed)
Pt req lab order prior to the appt 11/11/12. Please advise

## 2012-11-03 NOTE — Telephone Encounter (Signed)
Labs entered. Pt informed  

## 2012-11-08 ENCOUNTER — Ambulatory Visit (INDEPENDENT_AMBULATORY_CARE_PROVIDER_SITE_OTHER): Payer: Medicare Other

## 2012-11-08 DIAGNOSIS — I10 Essential (primary) hypertension: Secondary | ICD-10-CM

## 2012-11-08 DIAGNOSIS — R7309 Other abnormal glucose: Secondary | ICD-10-CM

## 2012-11-08 DIAGNOSIS — E785 Hyperlipidemia, unspecified: Secondary | ICD-10-CM

## 2012-11-08 LAB — BASIC METABOLIC PANEL
BUN: 16 mg/dL (ref 6–23)
CO2: 31 mEq/L (ref 19–32)
Chloride: 107 mEq/L (ref 96–112)
Creatinine, Ser: 0.7 mg/dL (ref 0.4–1.2)
Glucose, Bld: 105 mg/dL — ABNORMAL HIGH (ref 70–99)

## 2012-11-08 LAB — CBC WITH DIFFERENTIAL/PLATELET
Basophils Absolute: 0 10*3/uL (ref 0.0–0.1)
Eosinophils Absolute: 0.1 10*3/uL (ref 0.0–0.7)
HCT: 40.3 % (ref 36.0–46.0)
Lymphs Abs: 2.1 10*3/uL (ref 0.7–4.0)
MCV: 95.5 fl (ref 78.0–100.0)
Monocytes Absolute: 0.6 10*3/uL (ref 0.1–1.0)
Neutrophils Relative %: 56.1 % (ref 43.0–77.0)
Platelets: 199 10*3/uL (ref 150.0–400.0)
RDW: 13.1 % (ref 11.5–14.6)

## 2012-11-08 LAB — LIPID PANEL: Cholesterol: 259 mg/dL — ABNORMAL HIGH (ref 0–200)

## 2012-11-09 LAB — LDL CHOLESTEROL, DIRECT: Direct LDL: 159.1 mg/dL

## 2012-11-11 ENCOUNTER — Encounter: Payer: Self-pay | Admitting: Internal Medicine

## 2012-11-11 ENCOUNTER — Ambulatory Visit (INDEPENDENT_AMBULATORY_CARE_PROVIDER_SITE_OTHER): Payer: Medicare Other | Admitting: Internal Medicine

## 2012-11-11 VITALS — BP 160/90 | HR 72 | Temp 98.0°F | Resp 16 | Wt 137.0 lb

## 2012-11-11 DIAGNOSIS — E538 Deficiency of other specified B group vitamins: Secondary | ICD-10-CM

## 2012-11-11 DIAGNOSIS — E785 Hyperlipidemia, unspecified: Secondary | ICD-10-CM

## 2012-11-11 DIAGNOSIS — F411 Generalized anxiety disorder: Secondary | ICD-10-CM

## 2012-11-11 DIAGNOSIS — M545 Low back pain, unspecified: Secondary | ICD-10-CM

## 2012-11-11 DIAGNOSIS — R197 Diarrhea, unspecified: Secondary | ICD-10-CM

## 2012-11-11 DIAGNOSIS — I10 Essential (primary) hypertension: Secondary | ICD-10-CM

## 2012-11-11 MED ORDER — SIMVASTATIN 40 MG PO TABS: 40.0000 mg | ORAL_TABLET | Freq: Every day | ORAL | Status: DC

## 2012-11-11 MED ORDER — GABAPENTIN 100 MG PO CAPS: 100.0000 mg | ORAL_CAPSULE | Freq: Three times a day (TID) | ORAL | Status: DC | PRN

## 2012-11-11 MED ORDER — ATENOLOL 100 MG PO TABS: 100.0000 mg | ORAL_TABLET | Freq: Two times a day (BID) | ORAL | Status: DC

## 2012-11-11 MED ORDER — LORAZEPAM 1 MG PO TABS: 1.0000 mg | ORAL_TABLET | Freq: Two times a day (BID) | ORAL | Status: DC | PRN

## 2012-11-11 MED ORDER — DIPHENOXYLATE-ATROPINE 2.5-0.025 MG PO TABS: 1.0000 | ORAL_TABLET | Freq: Four times a day (QID) | ORAL | Status: DC | PRN

## 2012-11-11 MED ORDER — TEMAZEPAM 30 MG PO CAPS: 60.0000 mg | ORAL_CAPSULE | Freq: Every evening | ORAL | Status: DC | PRN

## 2012-11-11 MED ORDER — TRIAMCINOLONE ACETONIDE 0.5 % EX OINT: TOPICAL_OINTMENT | Freq: Two times a day (BID) | CUTANEOUS | Status: DC

## 2012-11-11 MED ORDER — CLOPIDOGREL BISULFATE 75 MG PO TABS: 75.0000 mg | ORAL_TABLET | Freq: Every day | ORAL | Status: DC

## 2012-11-11 NOTE — Progress Notes (Signed)
   Subjective:    Patient ID: Sandra [name], female    DOB: 29-Jul-1928, 79 y.o.   MRN: [id number]  HPI  F/u rash - elbows and behind the knees   F/u HTN, B12 def  BP Readings from Last 3 Encounters:  11/11/12 160/90  04/26/12 132/82  03/09/12 140/76   Wt Readings from Last 3 Encounters:  11/11/12 137 lb (62.143 kg)  04/26/12 144 lb (65.318 kg)  03/09/12 148 lb (67.132 kg)      Review of Systems  Constitutional: Negative for chills, activity change, appetite change, fatigue and unexpected weight change.  HENT: Negative for congestion, mouth sores and sinus pressure.   Eyes: Negative for visual disturbance.  Respiratory: Negative for cough and chest tightness.   Gastrointestinal: Negative for nausea and abdominal pain.  Genitourinary: Negative for frequency, difficulty urinating and vaginal pain.  Musculoskeletal: Negative for back pain and gait problem.  Skin: Negative for pallor and rash.  Neurological: Positive for dizziness. Negative for tremors, weakness and numbness.  Psychiatric/Behavioral: Negative for suicidal ideas, confusion and sleep disturbance. The patient is not nervous/anxious.        Objective:   Physical Exam  Constitutional: She appears well-developed. No distress.  HENT:  Head: Normocephalic.  Right Ear: External ear normal.  Left Ear: External ear normal.  Nose: Nose normal.  Mouth/Throat: Oropharynx is clear and moist.  Eyes: Conjunctivae are normal. Pupils are equal, round, and reactive to light. Right eye exhibits no discharge. Left eye exhibits no discharge.  Neck: Normal range of motion. Neck supple. No JVD present. No tracheal deviation present. No thyromegaly present.  Cardiovascular: Normal rate, regular rhythm and normal heart sounds.   Pulmonary/Chest: No stridor. No respiratory distress. She has no wheezes.  Abdominal: Soft. Bowel sounds are normal. She exhibits no distension and no mass. There is no tenderness. There is no rebound and  no guarding.  Musculoskeletal: She exhibits tenderness. She exhibits no edema.  R occip area is tender to palp  Lymphadenopathy:    She has no cervical adenopathy.  Neurological: She displays normal reflexes. No cranial nerve deficit. She exhibits normal muscle tone. Coordination normal.  Skin: No rash noted. No erythema.  Psychiatric: She has a normal mood and affect. Her behavior is normal. Judgment and thought content normal.  No pron drift, unable to do heel to toe walk - ataxic  Lab Results  Component Value Date   WBC 6.3 11/08/2012   HGB 13.6 11/08/2012   HCT 40.3 11/08/2012   PLT 199.0 11/08/2012   GLUCOSE 105* 11/08/2012   CHOL 259* 11/08/2012   TRIG 291.0* 11/08/2012   HDL 39.30 11/08/2012   LDLDIRECT 159.1 11/08/2012   LDLCALC 92 11/06/2008   ALT 18 11/06/2008   AST 15 11/06/2008   NA 142 11/08/2012   K 3.8 11/08/2012   CL 107 11/08/2012   CREATININE 0.7 11/08/2012   BUN 16 11/08/2012   CO2 31 11/08/2012   TSH 2.41 01/06/2011   HGBA1C 6.0 11/08/2012   MICROALBUR 1.7 11/06/2008         Assessment & Plan:

## 2012-11-11 NOTE — Assessment & Plan Note (Signed)
Continue with current prescription therapy as reflected on the Med list.  

## 2012-11-11 NOTE — Assessment & Plan Note (Signed)
Lomotil prn 

## 2012-11-11 NOTE — Assessment & Plan Note (Signed)
Tramadol prn Handicapped sticker

## 2012-11-11 NOTE — Assessment & Plan Note (Signed)
Chronic  5/14 worse off Rx Re-start Simvastatin

## 2013-02-28 ENCOUNTER — Encounter: Payer: Self-pay | Admitting: Internal Medicine

## 2013-02-28 ENCOUNTER — Ambulatory Visit (INDEPENDENT_AMBULATORY_CARE_PROVIDER_SITE_OTHER): Payer: Medicare Other | Admitting: Internal Medicine

## 2013-02-28 VITALS — BP 190/82 | HR 80 | Temp 98.9°F | Resp 16 | Wt 145.0 lb

## 2013-02-28 DIAGNOSIS — R279 Unspecified lack of coordination: Secondary | ICD-10-CM

## 2013-02-28 DIAGNOSIS — E538 Deficiency of other specified B group vitamins: Secondary | ICD-10-CM

## 2013-02-28 DIAGNOSIS — T148XXA Other injury of unspecified body region, initial encounter: Secondary | ICD-10-CM | POA: Insufficient documentation

## 2013-02-28 DIAGNOSIS — I10 Essential (primary) hypertension: Secondary | ICD-10-CM

## 2013-02-28 DIAGNOSIS — R270 Ataxia, unspecified: Secondary | ICD-10-CM

## 2013-02-28 DIAGNOSIS — R7309 Other abnormal glucose: Secondary | ICD-10-CM

## 2013-02-28 DIAGNOSIS — R209 Unspecified disturbances of skin sensation: Secondary | ICD-10-CM

## 2013-02-28 DIAGNOSIS — R202 Paresthesia of skin: Secondary | ICD-10-CM

## 2013-02-28 DIAGNOSIS — E785 Hyperlipidemia, unspecified: Secondary | ICD-10-CM

## 2013-02-28 DIAGNOSIS — R739 Hyperglycemia, unspecified: Secondary | ICD-10-CM

## 2013-02-28 NOTE — Assessment & Plan Note (Signed)
A1c

## 2013-02-28 NOTE — Assessment & Plan Note (Signed)
labs

## 2013-02-28 NOTE — Assessment & Plan Note (Signed)
Labs  Continue with current prescription therapy as reflected on the Med list.  

## 2013-02-28 NOTE — Assessment & Plan Note (Signed)
SBP=140 at home Continue with current prescription therapy as reflected on the Med list. Labs

## 2013-02-28 NOTE — Assessment & Plan Note (Signed)
Continue with current prescription therapy as reflected on the Med list. Labs  

## 2013-02-28 NOTE — Assessment & Plan Note (Signed)
8/14 ?etiology Labs

## 2013-02-28 NOTE — Progress Notes (Signed)
Subjective:    Rash This is a new problem. The current episode started 1 to 4 weeks ago. The problem has been gradually worsening since onset. The affected locations include the left arm, right arm and face. The rash is characterized by bruising. She was exposed to nothing. Pertinent negatives include no congestion, cough or fatigue. Past treatments include nothing. Her past medical history is significant for eczema. There is no history of allergies.    C/o bruises on arms x 3 wks and new n face x 1 day C/o B feet burning  F/u rash - elbows and behind the knees - better  F/u HTN, B12 def  BP Readings from Last 3 Encounters:  02/28/13 190/82  11/11/12 160/90  04/26/12 132/82   Wt Readings from Last 3 Encounters:  02/28/13 145 lb (65.772 kg)  11/11/12 137 lb (62.143 kg)  04/26/12 144 lb (65.318 kg)      Review of Systems  Constitutional: Negative for chills, activity change, appetite change, fatigue and unexpected weight change.  HENT: Negative for congestion, mouth sores and sinus pressure.   Eyes: Negative for visual disturbance.  Respiratory: Negative for cough and chest tightness.   Gastrointestinal: Negative for nausea and abdominal pain.  Genitourinary: Negative for frequency, difficulty urinating and vaginal pain.  Musculoskeletal: Negative for back pain and gait problem.  Skin: Negative for pallor and rash.  Neurological: Positive for dizziness. Negative for tremors, weakness and numbness.  Psychiatric/Behavioral: Negative for suicidal ideas, confusion and sleep disturbance. The patient is not nervous/anxious.        Objective:   Physical Exam  Constitutional: She appears well-developed. No distress.  HENT:  Head: Normocephalic.  Right Ear: External ear normal.  Left Ear: External ear normal.  Nose: Nose normal.  Mouth/Throat: Oropharynx is clear and moist.  Eyes: Conjunctivae are normal. Pupils are equal, round, and reactive to light. Right eye exhibits no  discharge. Left eye exhibits no discharge.  Neck: Normal range of motion. Neck supple. No JVD present. No tracheal deviation present. No thyromegaly present.  Cardiovascular: Normal rate, regular rhythm and normal heart sounds.   Pulmonary/Chest: No stridor. No respiratory distress. She has no wheezes.  Abdominal: Soft. Bowel sounds are normal. She exhibits no distension and no mass. There is no tenderness. There is no rebound and no guarding.  Musculoskeletal: She exhibits tenderness. She exhibits no edema.  R occip area is tender to palp  Lymphadenopathy:    She has no cervical adenopathy.  Neurological: She displays normal reflexes. No cranial nerve deficit. She exhibits normal muscle tone. Coordination normal.  Skin: No rash noted. No erythema.  Psychiatric: She has a normal mood and affect. Her behavior is normal. Judgment and thought content normal.  No pron drift, unable to do heel to toe walk - ataxic  Lab Results  Component Value Date   WBC 6.3 11/08/2012   HGB 13.6 11/08/2012   HCT 40.3 11/08/2012   PLT 199.0 11/08/2012   GLUCOSE 105* 11/08/2012   CHOL 259* 11/08/2012   TRIG 291.0* 11/08/2012   HDL 39.30 11/08/2012   LDLDIRECT 159.1 11/08/2012   LDLCALC 92 11/06/2008   ALT 18 11/06/2008   AST 15 11/06/2008   NA 142 11/08/2012   K 3.8 11/08/2012   CL 107 11/08/2012   CREATININE 0.7 11/08/2012   BUN 16 11/08/2012   CO2 31 11/08/2012   TSH 2.41 01/06/2011   HGBA1C 6.0 11/08/2012   MICROALBUR 1.7 11/06/2008  Assessment & Plan:

## 2013-02-28 NOTE — Assessment & Plan Note (Signed)
Stable

## 2013-03-01 ENCOUNTER — Other Ambulatory Visit (INDEPENDENT_AMBULATORY_CARE_PROVIDER_SITE_OTHER): Payer: Medicare Other

## 2013-03-01 DIAGNOSIS — E538 Deficiency of other specified B group vitamins: Secondary | ICD-10-CM

## 2013-03-01 DIAGNOSIS — R739 Hyperglycemia, unspecified: Secondary | ICD-10-CM

## 2013-03-01 DIAGNOSIS — T148XXA Other injury of unspecified body region, initial encounter: Secondary | ICD-10-CM

## 2013-03-01 DIAGNOSIS — E785 Hyperlipidemia, unspecified: Secondary | ICD-10-CM

## 2013-03-01 DIAGNOSIS — R202 Paresthesia of skin: Secondary | ICD-10-CM

## 2013-03-01 DIAGNOSIS — R209 Unspecified disturbances of skin sensation: Secondary | ICD-10-CM

## 2013-03-01 DIAGNOSIS — R7309 Other abnormal glucose: Secondary | ICD-10-CM

## 2013-03-01 LAB — URINALYSIS, ROUTINE W REFLEX MICROSCOPIC
Specific Gravity, Urine: <=1.005 (ref 1.000–1.030)
Total Protein, Urine: NEGATIVE
Urine Glucose: NEGATIVE
Urobilinogen, UA: 0.2 (ref 0.0–1.0)
pH: 6 (ref 5.0–8.0)

## 2013-03-01 LAB — HEMOGLOBIN A1C: Hgb A1c MFr Bld: 6.4 % (ref 4.6–6.5)

## 2013-03-01 LAB — CBC WITH DIFFERENTIAL/PLATELET
Basophils Absolute: 0 10*3/uL (ref 0.0–0.1)
Basophils Relative: 0.5 % (ref 0.0–3.0)
Eosinophils Absolute: 0.1 10*3/uL (ref 0.0–0.7)
HCT: 39.1 % (ref 36.0–46.0)
Hemoglobin: 13.5 g/dL (ref 12.0–15.0)
Lymphocytes Relative: 32.3 % (ref 12.0–46.0)
Lymphs Abs: 1.9 10*3/uL (ref 0.7–4.0)
MCHC: 34.4 g/dL (ref 30.0–36.0)
Neutro Abs: 3.3 10*3/uL (ref 1.4–7.7)
RBC: 4.13 Mil/uL (ref 3.87–5.11)
RDW: 13.1 % (ref 11.5–14.6)

## 2013-03-01 LAB — LIPID PANEL
Cholesterol: 159 mg/dL (ref 0–200)
Total CHOL/HDL Ratio: 4
VLDL: 41 mg/dL — ABNORMAL HIGH (ref 0.0–40.0)

## 2013-03-01 LAB — BASIC METABOLIC PANEL
CO2: 27 mEq/L (ref 19–32)
Chloride: 103 mEq/L (ref 96–112)
Glucose, Bld: 110 mg/dL — ABNORMAL HIGH (ref 70–99)
Potassium: 3.9 mEq/L (ref 3.5–5.1)
Sodium: 138 mEq/L (ref 135–145)

## 2013-03-01 LAB — HEPATIC FUNCTION PANEL
ALT: 14 U/L (ref 0–35)
AST: 14 U/L (ref 0–37)
Alkaline Phosphatase: 39 U/L (ref 39–117)
Total Bilirubin: 0.8 mg/dL (ref 0.3–1.2)

## 2013-03-01 LAB — VITAMIN B12: Vitamin B-12: 1306 pg/mL — ABNORMAL HIGH (ref 211–911)

## 2013-03-03 LAB — PROTEIN ELECTROPHORESIS, SERUM
Albumin ELP: 66 % (ref 55.8–66.1)
Alpha-1-Globulin: 4.3 % (ref 2.9–4.9)
Alpha-2-Globulin: 12 % — ABNORMAL HIGH (ref 7.1–11.8)
Beta 2: 4.5 % (ref 3.2–6.5)
Beta Globulin: 5.8 % (ref 4.7–7.2)
Total Protein, Serum Electrophoresis: 6.7 g/dL (ref 6.0–8.3)

## 2013-03-16 ENCOUNTER — Ambulatory Visit (INDEPENDENT_AMBULATORY_CARE_PROVIDER_SITE_OTHER): Payer: Medicare Other | Admitting: Internal Medicine

## 2013-03-16 ENCOUNTER — Encounter: Payer: Self-pay | Admitting: Internal Medicine

## 2013-03-16 VITALS — BP 140/90 | HR 80 | Temp 98.5°F | Resp 16 | Wt 147.0 lb

## 2013-03-16 DIAGNOSIS — R51 Headache: Secondary | ICD-10-CM

## 2013-03-16 DIAGNOSIS — Z23 Encounter for immunization: Secondary | ICD-10-CM

## 2013-03-16 DIAGNOSIS — R260 Ataxic gait: Secondary | ICD-10-CM

## 2013-03-16 DIAGNOSIS — I10 Essential (primary) hypertension: Secondary | ICD-10-CM

## 2013-03-16 DIAGNOSIS — T148XXA Other injury of unspecified body region, initial encounter: Secondary | ICD-10-CM

## 2013-03-16 DIAGNOSIS — N3090 Cystitis, unspecified without hematuria: Secondary | ICD-10-CM | POA: Insufficient documentation

## 2013-03-16 DIAGNOSIS — R269 Unspecified abnormalities of gait and mobility: Secondary | ICD-10-CM

## 2013-03-16 MED ORDER — LORAZEPAM 1 MG PO TABS: 1.0000 mg | ORAL_TABLET | Freq: Two times a day (BID) | ORAL | Status: DC | PRN

## 2013-03-16 MED ORDER — CLOPIDOGREL BISULFATE 75 MG PO TABS: 75.0000 mg | ORAL_TABLET | Freq: Every day | ORAL | Status: DC

## 2013-03-16 MED ORDER — TRIAMCINOLONE ACETONIDE 0.5 % EX OINT: TOPICAL_OINTMENT | Freq: Two times a day (BID) | CUTANEOUS | Status: DC

## 2013-03-16 MED ORDER — SIMVASTATIN 40 MG PO TABS: 40.0000 mg | ORAL_TABLET | Freq: Every day | ORAL | Status: DC

## 2013-03-16 MED ORDER — ATENOLOL 100 MG PO TABS: 100.0000 mg | ORAL_TABLET | Freq: Two times a day (BID) | ORAL | Status: DC

## 2013-03-16 MED ORDER — CIPROFLOXACIN HCL 250 MG PO TABS: 250.0000 mg | ORAL_TABLET | Freq: Two times a day (BID) | ORAL | Status: DC

## 2013-03-16 MED ORDER — GABAPENTIN 100 MG PO CAPS: 100.0000 mg | ORAL_CAPSULE | Freq: Three times a day (TID) | ORAL | Status: DC | PRN

## 2013-03-16 MED ORDER — TEMAZEPAM 30 MG PO CAPS: 60.0000 mg | ORAL_CAPSULE | Freq: Every evening | ORAL | Status: DC | PRN

## 2013-03-16 NOTE — Patient Instructions (Addendum)
We will do a skin biopsy if not better

## 2013-03-16 NOTE — Assessment & Plan Note (Signed)
Resolving

## 2013-03-16 NOTE — Assessment & Plan Note (Signed)
Continue with current prescription therapy as reflected on the Med list. Better 

## 2013-03-16 NOTE — Assessment & Plan Note (Signed)
cipro x 5 d

## 2013-03-16 NOTE — Assessment & Plan Note (Addendum)
?  etiology - likely Plavix related Labs ok Better Skin bx if not resolved

## 2013-03-16 NOTE — Assessment & Plan Note (Signed)
Better  

## 2013-03-16 NOTE — Progress Notes (Signed)
Subjective:    Rash This is a new problem. The current episode started 1 to 4 weeks ago. The problem has been gradually improving since onset. The affected locations include the left arm, right arm and face. The rash is characterized by bruising. She was exposed to nothing. Pertinent negatives include no congestion, cough or fatigue. Past treatments include nothing. Her past medical history is significant for eczema. There is no history of allergies.    C/o bruises on arms x 5 wks and new n face x 1 day C/o B feet burning  F/u rash - elbows and behind the knees - better  F/u HTN, B12 def  BP Readings from Last 3 Encounters:  03/16/13 140/90  02/28/13 190/82  11/11/12 160/90   Wt Readings from Last 3 Encounters:  03/16/13 147 lb (66.679 kg)  02/28/13 145 lb (65.772 kg)  11/11/12 137 lb (62.143 kg)      Review of Systems  Constitutional: Negative for chills, activity change, appetite change, fatigue and unexpected weight change.  HENT: Negative for congestion, mouth sores and sinus pressure.   Eyes: Negative for visual disturbance.  Respiratory: Negative for cough and chest tightness.   Gastrointestinal: Negative for nausea and abdominal pain.  Genitourinary: Negative for frequency, difficulty urinating and vaginal pain.  Musculoskeletal: Negative for back pain and gait problem.  Skin: Negative for pallor and rash.  Neurological: Positive for dizziness. Negative for tremors, weakness and numbness.  Psychiatric/Behavioral: Negative for suicidal ideas, confusion and sleep disturbance. The patient is not nervous/anxious.        Objective:   Physical Exam  Constitutional: She appears well-developed. No distress.  HENT:  Head: Normocephalic.  Right Ear: External ear normal.  Left Ear: External ear normal.  Nose: Nose normal.  Mouth/Throat: Oropharynx is clear and moist.  Eyes: Conjunctivae are normal. Pupils are equal, round, and reactive to light. Right eye exhibits no  discharge. Left eye exhibits no discharge.  Neck: Normal range of motion. Neck supple. No JVD present. No tracheal deviation present. No thyromegaly present.  Cardiovascular: Normal rate, regular rhythm and normal heart sounds.   Pulmonary/Chest: No stridor. No respiratory distress. She has no wheezes.  Abdominal: Soft. Bowel sounds are normal. She exhibits no distension and no mass. There is no tenderness. There is no rebound and no guarding.  Musculoskeletal: She exhibits tenderness. She exhibits no edema.  R occip area is tender to palp  Lymphadenopathy:    She has no cervical adenopathy.  Neurological: She displays normal reflexes. No cranial nerve deficit. She exhibits normal muscle tone. Coordination normal.  Skin: No rash noted. No erythema.  Psychiatric: She has a normal mood and affect. Her behavior is normal. Judgment and thought content normal.  No pron drift, unable to do heel to toe walk - ataxic  Lab Results  Component Value Date   WBC 5.8 03/01/2013   HGB 13.5 03/01/2013   HCT 39.1 03/01/2013   PLT 174.0 03/01/2013   GLUCOSE 110* 03/01/2013   CHOL 159 03/01/2013   TRIG 205.0* 03/01/2013   HDL 44.30 03/01/2013   LDLDIRECT 91.5 03/01/2013   LDLCALC 92 11/06/2008   ALT 14 03/01/2013   AST 14 03/01/2013   NA 138 03/01/2013   K 3.9 03/01/2013   CL 103 03/01/2013   CREATININE 0.8 03/01/2013   BUN 14 03/01/2013   CO2 27 03/01/2013   TSH 2.41 01/06/2011   INR 1.1* 03/01/2013   HGBA1C 6.4 03/01/2013   MICROALBUR 1.7 11/06/2008  Assessment & Plan:

## 2013-04-22 ENCOUNTER — Other Ambulatory Visit: Payer: Self-pay | Admitting: Internal Medicine

## 2013-04-25 NOTE — Telephone Encounter (Signed)
Spoke to pharmacy, pt still has refills left on file & picked up rx yesterday at pharmacy.  Refill request sent in error.

## 2013-05-25 ENCOUNTER — Other Ambulatory Visit: Payer: Self-pay | Admitting: Internal Medicine

## 2013-06-15 ENCOUNTER — Encounter: Payer: Self-pay | Admitting: Internal Medicine

## 2013-06-15 ENCOUNTER — Ambulatory Visit (INDEPENDENT_AMBULATORY_CARE_PROVIDER_SITE_OTHER): Payer: Medicare Other | Admitting: Internal Medicine

## 2013-06-15 VITALS — BP 132/86 | HR 73 | Temp 98.1°F | Resp 16 | Ht 62.0 in | Wt 149.5 lb

## 2013-06-15 DIAGNOSIS — D485 Neoplasm of uncertain behavior of skin: Secondary | ICD-10-CM

## 2013-06-15 DIAGNOSIS — I10 Essential (primary) hypertension: Secondary | ICD-10-CM

## 2013-06-15 MED ORDER — TRIAMCINOLONE ACETONIDE 0.5 % EX OINT: TOPICAL_OINTMENT | Freq: Two times a day (BID) | CUTANEOUS | Status: DC

## 2013-06-15 MED ORDER — LORAZEPAM 1 MG PO TABS: 1.0000 mg | ORAL_TABLET | Freq: Two times a day (BID) | ORAL | Status: DC | PRN

## 2013-06-15 MED ORDER — SIMVASTATIN 40 MG PO TABS: 40.0000 mg | ORAL_TABLET | Freq: Every day | ORAL | Status: DC

## 2013-06-15 MED ORDER — PROMETHAZINE-CODEINE 6.25-10 MG/5ML PO SYRP: 5.0000 mL | ORAL_SOLUTION | Freq: Four times a day (QID) | ORAL | Status: DC | PRN

## 2013-06-15 MED ORDER — CLOPIDOGREL BISULFATE 75 MG PO TABS: 75.0000 mg | ORAL_TABLET | Freq: Every day | ORAL | Status: DC

## 2013-06-15 MED ORDER — CLONIDINE HCL 0.1 MG PO TABS: 0.1000 mg | ORAL_TABLET | Freq: Every day | ORAL | Status: DC

## 2013-06-15 MED ORDER — TEMAZEPAM 30 MG PO CAPS: ORAL_CAPSULE | ORAL | Status: DC

## 2013-06-15 NOTE — Assessment & Plan Note (Signed)
Skin bx 

## 2013-06-15 NOTE — Assessment & Plan Note (Signed)
Better Continue with current prescription therapy as reflected on the Med list.  

## 2013-06-15 NOTE — Progress Notes (Signed)
Subjective:    HPI  C/o cough and congestion x 1 wk F/u bruises on arms - better F/u B feet burning  F/u rash - elbows and behind the knees - better  F/u HTN, B12 def  BP Readings from Last 3 Encounters:  06/15/13 132/86  03/16/13 140/90  02/28/13 190/82   Wt Readings from Last 3 Encounters:  06/15/13 149 lb 8 oz (67.813 kg)  03/16/13 147 lb (66.679 kg)  02/28/13 145 lb (65.772 kg)      Review of Systems  Constitutional: Negative for chills, activity change, appetite change and unexpected weight change.  HENT: Positive for rhinorrhea. Negative for mouth sores and sinus pressure.   Eyes: Negative for visual disturbance.  Respiratory: Positive for cough. Negative for chest tightness.   Gastrointestinal: Negative for nausea and abdominal pain.  Genitourinary: Negative for frequency, difficulty urinating and vaginal pain.  Musculoskeletal: Negative for back pain and gait problem.  Skin: Negative for pallor.  Neurological: Positive for dizziness. Negative for tremors, weakness and numbness.  Psychiatric/Behavioral: Negative for suicidal ideas, confusion and sleep disturbance. The patient is not nervous/anxious.    Lab Results  Component Value Date   WBC 5.8 03/01/2013   HGB 13.5 03/01/2013   HCT 39.1 03/01/2013   PLT 174.0 03/01/2013   GLUCOSE 110* 03/01/2013   CHOL 159 03/01/2013   TRIG 205.0* 03/01/2013   HDL 44.30 03/01/2013   LDLDIRECT 91.5 03/01/2013   LDLCALC 92 11/06/2008   ALT 14 03/01/2013   AST 14 03/01/2013   NA 138 03/01/2013   K 3.9 03/01/2013   CL 103 03/01/2013   CREATININE 0.8 03/01/2013   BUN 14 03/01/2013   CO2 27 03/01/2013   TSH 2.41 01/06/2011   INR 1.1* 03/01/2013   HGBA1C 6.4 03/01/2013   MICROALBUR 1.7 11/06/2008       Objective:   Physical Exam  Constitutional: She appears well-developed. No distress.  HENT:  Head: Normocephalic.  Right Ear: External ear normal.  Left Ear: External ear normal.  Nose: Nose normal.  Mouth/Throat: Oropharynx is  clear and moist.  Eyes: Conjunctivae are normal. Pupils are equal, round, and reactive to light. Right eye exhibits no discharge. Left eye exhibits no discharge.  Neck: Normal range of motion. Neck supple. No JVD present. No tracheal deviation present. No thyromegaly present.  Cardiovascular: Normal rate, regular rhythm and normal heart sounds.   Pulmonary/Chest: No stridor. No respiratory distress. She has no wheezes.  Abdominal: Soft. Bowel sounds are normal. She exhibits no distension and no mass. There is no tenderness. There is no rebound and no guarding.  Musculoskeletal: She exhibits tenderness. She exhibits no edema.  R occip area is tender to palp  Lymphadenopathy:    She has no cervical adenopathy.  Neurological: She displays normal reflexes. No cranial nerve deficit. She exhibits normal muscle tone. Coordination normal.  Skin: No rash noted. No erythema.  Psychiatric: She has a normal mood and affect. Her behavior is normal. Judgment and thought content normal.  Less ataxic  Lab Results  Component Value Date   WBC 5.8 03/01/2013   HGB 13.5 03/01/2013   HCT 39.1 03/01/2013   PLT 174.0 03/01/2013   GLUCOSE 110* 03/01/2013   CHOL 159 03/01/2013   TRIG 205.0* 03/01/2013   HDL 44.30 03/01/2013   LDLDIRECT 91.5 03/01/2013   LDLCALC 92 11/06/2008   ALT 14 03/01/2013   AST 14 03/01/2013   NA 138 03/01/2013   K 3.9 03/01/2013   CL 103 03/01/2013  CREATININE 0.8 03/01/2013   BUN 14 03/01/2013   CO2 27 03/01/2013   TSH 2.41 01/06/2011   INR 1.1* 03/01/2013   HGBA1C 6.4 03/01/2013   MICROALBUR 1.7 11/06/2008         Assessment & Plan:

## 2013-06-15 NOTE — Assessment & Plan Note (Signed)
URI Prom-cod syr

## 2013-07-13 ENCOUNTER — Ambulatory Visit: Payer: Medicare Other | Admitting: Internal Medicine

## 2013-07-27 ENCOUNTER — Other Ambulatory Visit: Payer: Self-pay | Admitting: Internal Medicine

## 2013-07-28 NOTE — Telephone Encounter (Signed)
Pt declined an appointment for a cough.

## 2013-11-28 ENCOUNTER — Other Ambulatory Visit: Payer: Self-pay | Admitting: Internal Medicine

## 2013-12-01 ENCOUNTER — Other Ambulatory Visit: Payer: Self-pay | Admitting: Internal Medicine

## 2013-12-06 NOTE — Telephone Encounter (Signed)
Needs ov

## 2013-12-15 ENCOUNTER — Ambulatory Visit (INDEPENDENT_AMBULATORY_CARE_PROVIDER_SITE_OTHER): Payer: Medicare Other | Admitting: Internal Medicine

## 2013-12-15 ENCOUNTER — Other Ambulatory Visit (INDEPENDENT_AMBULATORY_CARE_PROVIDER_SITE_OTHER): Payer: Medicare Other

## 2013-12-15 ENCOUNTER — Encounter: Payer: Self-pay | Admitting: Internal Medicine

## 2013-12-15 VITALS — BP 170/88 | HR 76 | Temp 98.1°F | Resp 16 | Wt 152.0 lb

## 2013-12-15 DIAGNOSIS — F411 Generalized anxiety disorder: Secondary | ICD-10-CM

## 2013-12-15 DIAGNOSIS — I10 Essential (primary) hypertension: Secondary | ICD-10-CM

## 2013-12-15 DIAGNOSIS — E559 Vitamin D deficiency, unspecified: Secondary | ICD-10-CM

## 2013-12-15 DIAGNOSIS — E538 Deficiency of other specified B group vitamins: Secondary | ICD-10-CM

## 2013-12-15 DIAGNOSIS — R7309 Other abnormal glucose: Secondary | ICD-10-CM

## 2013-12-15 LAB — CBC WITH DIFFERENTIAL/PLATELET
BASOS PCT: 0.4 % (ref 0.0–3.0)
Basophils Absolute: 0 10*3/uL (ref 0.0–0.1)
EOS PCT: 0.6 % (ref 0.0–5.0)
Eosinophils Absolute: 0 10*3/uL (ref 0.0–0.7)
HCT: 42 % (ref 36.0–46.0)
Hemoglobin: 14.2 g/dL (ref 12.0–15.0)
LYMPHS PCT: 28.7 % (ref 12.0–46.0)
Lymphs Abs: 2 10*3/uL (ref 0.7–4.0)
MCHC: 33.9 g/dL (ref 30.0–36.0)
MCV: 95.9 fl (ref 78.0–100.0)
MONO ABS: 0.7 10*3/uL (ref 0.1–1.0)
Monocytes Relative: 10 % (ref 3.0–12.0)
Neutro Abs: 4.2 10*3/uL (ref 1.4–7.7)
Neutrophils Relative %: 60.3 % (ref 43.0–77.0)
PLATELETS: 191 10*3/uL (ref 150.0–400.0)
RBC: 4.37 Mil/uL (ref 3.87–5.11)
RDW: 13 % (ref 11.5–15.5)
WBC: 6.9 10*3/uL (ref 4.0–10.5)

## 2013-12-15 LAB — BASIC METABOLIC PANEL
BUN: 19 mg/dL (ref 6–23)
CALCIUM: 9.7 mg/dL (ref 8.4–10.5)
CHLORIDE: 106 meq/L (ref 96–112)
CO2: 30 mEq/L (ref 19–32)
CREATININE: 0.9 mg/dL (ref 0.4–1.2)
GFR: 64.45 mL/min (ref >60.00)
Glucose, Bld: 101 mg/dL — ABNORMAL HIGH (ref 70–99)
Potassium: 4 mEq/L (ref 3.5–5.1)
Sodium: 142 mEq/L (ref 135–145)

## 2013-12-15 LAB — VITAMIN B12: VITAMIN B 12: 1138 pg/mL — AB (ref 211–911)

## 2013-12-15 MED ORDER — GABAPENTIN 100 MG PO CAPS: 100.0000 mg | ORAL_CAPSULE | Freq: Three times a day (TID) | ORAL | Status: DC | PRN

## 2013-12-15 MED ORDER — TEMAZEPAM 30 MG PO CAPS: ORAL_CAPSULE | ORAL | Status: DC

## 2013-12-15 MED ORDER — TRIAMCINOLONE ACETONIDE 0.5 % EX OINT: TOPICAL_OINTMENT | Freq: Two times a day (BID) | CUTANEOUS | Status: DC

## 2013-12-15 MED ORDER — HYDROCODONE-ACETAMINOPHEN 5-325 MG PO TABS: 1.0000 | ORAL_TABLET | Freq: Three times a day (TID) | ORAL | Status: DC | PRN

## 2013-12-15 MED ORDER — CLOPIDOGREL BISULFATE 75 MG PO TABS: 75.0000 mg | ORAL_TABLET | Freq: Every day | ORAL | Status: DC

## 2013-12-15 MED ORDER — SIMVASTATIN 40 MG PO TABS: 40.0000 mg | ORAL_TABLET | Freq: Every day | ORAL | Status: DC

## 2013-12-15 MED ORDER — LORAZEPAM 1 MG PO TABS: ORAL_TABLET | ORAL | Status: DC

## 2013-12-15 MED ORDER — ATENOLOL 100 MG PO TABS: 100.0000 mg | ORAL_TABLET | Freq: Two times a day (BID) | ORAL | Status: DC

## 2013-12-15 NOTE — Assessment & Plan Note (Signed)
Continue with current prescription therapy as reflected on the Med list.  

## 2013-12-15 NOTE — Assessment & Plan Note (Signed)
A1c

## 2013-12-15 NOTE — Progress Notes (Signed)
Pre visit review using our clinic review tool, if applicable. No additional management support is needed unless otherwise documented below in the visit note. 

## 2013-12-15 NOTE — Progress Notes (Signed)
Subjective:    HPI   F/u bruises on arms - better F/u B feet burning  F/u rash - elbows and behind the knees - better  F/u HTN, B12 def. BP is good at home  BP Readings from Last 3 Encounters:  12/15/13 170/88  06/15/13 132/86  03/16/13 140/90   Wt Readings from Last 3 Encounters:  12/15/13 152 lb (68.947 kg)  06/15/13 149 lb 8 oz (67.813 kg)  03/16/13 147 lb (66.679 kg)      Review of Systems  Constitutional: Negative for chills, activity change, appetite change and unexpected weight change.  HENT: Positive for rhinorrhea. Negative for mouth sores and sinus pressure.   Eyes: Negative for visual disturbance.  Respiratory: Positive for cough. Negative for chest tightness.   Gastrointestinal: Negative for nausea and abdominal pain.  Genitourinary: Negative for frequency, difficulty urinating and vaginal pain.  Musculoskeletal: Negative for back pain and gait problem.  Skin: Negative for pallor.  Neurological: Positive for dizziness. Negative for tremors, weakness and numbness.  Psychiatric/Behavioral: Negative for suicidal ideas, confusion and sleep disturbance. The patient is not nervous/anxious.    Lab Results  Component Value Date   WBC 5.8 03/01/2013   HGB 13.5 03/01/2013   HCT 39.1 03/01/2013   PLT 174.0 03/01/2013   GLUCOSE 110* 03/01/2013   CHOL 159 03/01/2013   TRIG 205.0* 03/01/2013   HDL 44.30 03/01/2013   LDLDIRECT 91.5 03/01/2013   LDLCALC 92 11/06/2008   ALT 14 03/01/2013   AST 14 03/01/2013   NA 138 03/01/2013   K 3.9 03/01/2013   CL 103 03/01/2013   CREATININE 0.8 03/01/2013   BUN 14 03/01/2013   CO2 27 03/01/2013   TSH 2.41 01/06/2011   INR 1.1* 03/01/2013   HGBA1C 6.4 03/01/2013   MICROALBUR 1.7 11/06/2008       Objective:   Physical Exam  Constitutional: She appears well-developed. No distress.  HENT:  Head: Normocephalic.  Right Ear: External ear normal.  Left Ear: External ear normal.  Nose: Nose normal.  Mouth/Throat: Oropharynx is clear and  moist.  Eyes: Conjunctivae are normal. Pupils are equal, round, and reactive to light. Right eye exhibits no discharge. Left eye exhibits no discharge.  Neck: Normal range of motion. Neck supple. No JVD present. No tracheal deviation present. No thyromegaly present.  Cardiovascular: Normal rate, regular rhythm and normal heart sounds.   Pulmonary/Chest: No stridor. No respiratory distress. She has no wheezes.  Abdominal: Soft. Bowel sounds are normal. She exhibits no distension and no mass. There is no tenderness. There is no rebound and no guarding.  Musculoskeletal: She exhibits tenderness. She exhibits no edema.  R occip area is tender to palp  Lymphadenopathy:    She has no cervical adenopathy.  Neurological: She displays normal reflexes. No cranial nerve deficit. She exhibits normal muscle tone. Coordination normal.  Skin: No rash noted. No erythema.  Psychiatric: She has a normal mood and affect. Her behavior is normal. Judgment and thought content normal.  Less ataxic  Lab Results  Component Value Date   WBC 5.8 03/01/2013   HGB 13.5 03/01/2013   HCT 39.1 03/01/2013   PLT 174.0 03/01/2013   GLUCOSE 110* 03/01/2013   CHOL 159 03/01/2013   TRIG 205.0* 03/01/2013   HDL 44.30 03/01/2013   LDLDIRECT 91.5 03/01/2013   LDLCALC 92 11/06/2008   ALT 14 03/01/2013   AST 14 03/01/2013   NA 138 03/01/2013   K 3.9 03/01/2013   CL 103 03/01/2013  CREATININE 0.8 03/01/2013   BUN 14 03/01/2013   CO2 27 03/01/2013   TSH 2.41 01/06/2011   INR 1.1* 03/01/2013   HGBA1C 6.4 03/01/2013   MICROALBUR 1.7 11/06/2008         Assessment & Plan:

## 2013-12-15 NOTE — Assessment & Plan Note (Signed)
BP is ok at home - labile Continue with current prescription therapy as reflected on the Med list.

## 2013-12-16 ENCOUNTER — Telehealth: Payer: Self-pay | Admitting: Internal Medicine

## 2013-12-16 NOTE — Telephone Encounter (Signed)
Relevant patient education assigned to patient using Emmi. ° °

## 2013-12-28 ENCOUNTER — Other Ambulatory Visit: Payer: Self-pay | Admitting: Internal Medicine

## 2013-12-29 ENCOUNTER — Other Ambulatory Visit: Payer: Self-pay | Admitting: *Deleted

## 2013-12-29 MED ORDER — ATENOLOL 100 MG PO TABS: 100.0000 mg | ORAL_TABLET | Freq: Two times a day (BID) | ORAL | Status: DC

## 2013-12-29 MED ORDER — CLONIDINE HCL 0.1 MG PO TABS: 0.1000 mg | ORAL_TABLET | Freq: Every day | ORAL | Status: DC

## 2013-12-29 MED ORDER — SIMVASTATIN 40 MG PO TABS: 40.0000 mg | ORAL_TABLET | Freq: Every day | ORAL | Status: DC

## 2013-12-29 MED ORDER — CLOPIDOGREL BISULFATE 75 MG PO TABS: 75.0000 mg | ORAL_TABLET | Freq: Every day | ORAL | Status: DC

## 2013-12-29 MED ORDER — GABAPENTIN 100 MG PO CAPS: 100.0000 mg | ORAL_CAPSULE | Freq: Three times a day (TID) | ORAL | Status: DC | PRN

## 2014-03-29 ENCOUNTER — Other Ambulatory Visit: Payer: Self-pay | Admitting: Internal Medicine

## 2014-04-04 NOTE — Telephone Encounter (Signed)
Pt left msg on triage stating her pharmacy been trying to get refill on her lorazepam. Sent a week ago md still haven't responded. MD is out is this ok to refill...Sandra [name]

## 2014-04-05 NOTE — Telephone Encounter (Signed)
30d rx authorized, no refills - covering in absence of usual PCP Printed and signed

## 2014-04-05 NOTE — Telephone Encounter (Signed)
[facility] has [facility].../lmb

## 2014-04-13 ENCOUNTER — Ambulatory Visit (INDEPENDENT_AMBULATORY_CARE_PROVIDER_SITE_OTHER): Payer: Medicare Other

## 2014-04-13 DIAGNOSIS — Z23 Encounter for immunization: Secondary | ICD-10-CM

## 2014-04-21 ENCOUNTER — Ambulatory Visit: Payer: Medicare Other

## 2014-05-24 ENCOUNTER — Encounter: Payer: Self-pay | Admitting: Internal Medicine

## 2014-06-14 ENCOUNTER — Telehealth: Payer: Self-pay | Admitting: Internal Medicine

## 2014-06-14 MED ORDER — ALPRAZOLAM 0.5 MG PO TABS: 0.5000 mg | ORAL_TABLET | Freq: Three times a day (TID) | ORAL | Status: DC | PRN

## 2014-06-14 NOTE — Telephone Encounter (Signed)
Couldn't locate script so i call [facility]. Called pt no answer LMOM med [facility]..../lmb

## 2014-06-14 NOTE — Telephone Encounter (Signed)
I'm sorry! Xanax prn Thx

## 2014-06-14 NOTE — Telephone Encounter (Signed)
Patient states her son past away in his sleep.  She is requesting something to be sent to [facility] on [address] to help her sleep, stop shaking and stop crying.  She states what she is already taking not working.  She is requesting a call back in regards.

## 2014-06-15 ENCOUNTER — Telehealth: Payer: Self-pay | Admitting: Internal Medicine

## 2014-06-15 MED ORDER — TRAZODONE HCL 100 MG PO TABS: 100.0000 mg | ORAL_TABLET | Freq: Every day | ORAL | Status: DC

## 2014-06-15 NOTE — Telephone Encounter (Signed)
Spoke w/pt: she did not take Xanax, because it was a "new medicine" and she wanted to wait for her dtr to be there first. She did not sleep. I'll send Trazadone to the drug store. No driving while on meds. AP

## 2014-06-15 NOTE — Telephone Encounter (Signed)
Pt very upset, crying, son died recently. Pt does have Xanex but specifically wants something for sleep, she cannot sleep. Pt wants a return phone call this am please. Pls let her know if this can be done, and call her [phone]. This patient is incredibly upset. Pls let her know.

## 2014-06-19 ENCOUNTER — Ambulatory Visit (INDEPENDENT_AMBULATORY_CARE_PROVIDER_SITE_OTHER): Payer: Medicare Other | Admitting: Internal Medicine

## 2014-06-19 ENCOUNTER — Encounter: Payer: Self-pay | Admitting: Internal Medicine

## 2014-06-19 VITALS — BP 160/90 | HR 68 | Wt 150.0 lb

## 2014-06-19 DIAGNOSIS — E785 Hyperlipidemia, unspecified: Secondary | ICD-10-CM

## 2014-06-19 DIAGNOSIS — N951 Menopausal and female climacteric states: Secondary | ICD-10-CM

## 2014-06-19 DIAGNOSIS — G4700 Insomnia, unspecified: Secondary | ICD-10-CM

## 2014-06-19 DIAGNOSIS — M5440 Lumbago with sciatica, unspecified side: Secondary | ICD-10-CM

## 2014-06-19 DIAGNOSIS — I10 Essential (primary) hypertension: Secondary | ICD-10-CM

## 2014-06-19 DIAGNOSIS — F4321 Adjustment disorder with depressed mood: Secondary | ICD-10-CM

## 2014-06-19 MED ORDER — CLOPIDOGREL BISULFATE 75 MG PO TABS: 75.0000 mg | ORAL_TABLET | Freq: Every day | ORAL | Status: DC

## 2014-06-19 MED ORDER — SIMVASTATIN 40 MG PO TABS: 40.0000 mg | ORAL_TABLET | Freq: Every day | ORAL | Status: DC

## 2014-06-19 MED ORDER — QUETIAPINE FUMARATE 100 MG PO TABS: 100.0000 mg | ORAL_TABLET | Freq: Every day | ORAL | Status: DC

## 2014-06-19 MED ORDER — TEMAZEPAM 30 MG PO CAPS: ORAL_CAPSULE | ORAL | Status: DC

## 2014-06-19 MED ORDER — ATENOLOL 100 MG PO TABS: 100.0000 mg | ORAL_TABLET | Freq: Two times a day (BID) | ORAL | Status: DC

## 2014-06-19 MED ORDER — LORAZEPAM 1 MG PO TABS: ORAL_TABLET | ORAL | Status: DC

## 2014-06-19 MED ORDER — ALPRAZOLAM 0.5 MG PO TABS: 0.5000 mg | ORAL_TABLET | Freq: Three times a day (TID) | ORAL | Status: DC | PRN

## 2014-06-19 MED ORDER — CLONIDINE HCL 0.1 MG PO TABS: 0.1000 mg | ORAL_TABLET | Freq: Two times a day (BID) | ORAL | Status: DC

## 2014-06-19 MED ORDER — TRIAMCINOLONE ACETONIDE 0.5 % EX OINT: TOPICAL_OINTMENT | Freq: Two times a day (BID) | CUTANEOUS | Status: DC

## 2014-06-19 NOTE — Assessment & Plan Note (Addendum)
Chronic Refractory Pt needs high doses of Benzo (no side effects)  D/c Trazodone Added Seroquel   Potential benefits of a long term benzodiazepines and Seroquel use as well as potential risks  and complications were explained to the patient and were aknowledged.

## 2014-06-19 NOTE — Assessment & Plan Note (Signed)
Continue with current prn OTC or prescription therapy as reflected on the Med list.

## 2014-06-19 NOTE — Progress Notes (Signed)
Pre visit review using our clinic review tool, if applicable. No additional management support is needed unless otherwise documented below in the visit note. 

## 2014-06-19 NOTE — Progress Notes (Signed)
Subjective:    HPI  C/o grief: son died in his sleep 2023/07/21 Pt can't sleep x2-3 hrs max. Trazodone did not help. C/o sweats. Taking Gabapentin prn only - rare F/u bruises on arms - better F/u B feet burning  F/u rash - elbows and behind the knees - better  F/u HTN, B12 def. BP is good at home  BP Readings from Last 3 Encounters:  06/19/14 160/90  12/15/13 170/88  06/15/13 132/86   Wt Readings from Last 3 Encounters:  06/19/14 150 lb (68.04 kg)  12/15/13 152 lb (68.947 kg)  06/15/13 149 lb 8 oz (67.813 kg)      Review of Systems  Constitutional: Negative for chills, activity change, appetite change and unexpected weight change.  HENT: Positive for rhinorrhea. Negative for mouth sores and sinus pressure.   Eyes: Negative for visual disturbance.  Respiratory: Positive for cough. Negative for chest tightness.   Gastrointestinal: Negative for nausea and abdominal pain.  Genitourinary: Negative for frequency, difficulty urinating and vaginal pain.  Musculoskeletal: Negative for back pain and gait problem.  Skin: Negative for pallor.  Neurological: Positive for dizziness. Negative for tremors, weakness and numbness.  Psychiatric/Behavioral: Positive for dysphoric mood and decreased concentration. Negative for suicidal ideas, confusion and sleep disturbance. The patient is nervous/anxious.    Lab Results  Component Value Date   WBC 6.9 12/15/2013   HGB 14.2 12/15/2013   HCT 42.0 12/15/2013   PLT 191.0 12/15/2013   GLUCOSE 101* 12/15/2013   CHOL 159 03/01/2013   TRIG 205.0* 03/01/2013   HDL 44.30 03/01/2013   LDLDIRECT 91.5 03/01/2013   LDLCALC 92 11/06/2008   ALT 14 03/01/2013   AST 14 03/01/2013   NA 142 12/15/2013   K 4.0 12/15/2013   CL 106 12/15/2013   CREATININE 0.9 12/15/2013   BUN 19 12/15/2013   CO2 30 12/15/2013   TSH 2.41 01/06/2011   INR 1.1* 03/01/2013   HGBA1C 6.4 03/01/2013   MICROALBUR 1.7 11/06/2008       Objective:   Physical Exam   Constitutional: She appears well-developed. No distress.  HENT:  Head: Normocephalic.  Right Ear: External ear normal.  Left Ear: External ear normal.  Nose: Nose normal.  Mouth/Throat: Oropharynx is clear and moist.  Eyes: Conjunctivae are normal. Pupils are equal, round, and reactive to light. Right eye exhibits no discharge. Left eye exhibits no discharge.  Neck: Normal range of motion. Neck supple. No JVD present. No tracheal deviation present. No thyromegaly present.  Cardiovascular: Normal rate, regular rhythm and normal heart sounds.   Pulmonary/Chest: No stridor. No respiratory distress. She has no wheezes.  Abdominal: Soft. Bowel sounds are normal. She exhibits no distension and no mass. There is no tenderness. There is no rebound and no guarding.  Musculoskeletal: She exhibits no edema or tenderness.  Lymphadenopathy:    She has no cervical adenopathy.  Neurological: She displays normal reflexes. No cranial nerve deficit. She exhibits normal muscle tone. Coordination normal.  Skin: No rash noted. No erythema.  Psychiatric: Her behavior is normal. Judgment and thought content normal.  Less ataxic Small bruises  Lab Results  Component Value Date   WBC 6.9 12/15/2013   HGB 14.2 12/15/2013   HCT 42.0 12/15/2013   PLT 191.0 12/15/2013   GLUCOSE 101* 12/15/2013   CHOL 159 03/01/2013   TRIG 205.0* 03/01/2013   HDL 44.30 03/01/2013   LDLDIRECT 91.5 03/01/2013   LDLCALC 92 11/06/2008   ALT 14 03/01/2013   AST 14  03/01/2013   NA 142 12/15/2013   K 4.0 12/15/2013   CL 106 12/15/2013   CREATININE 0.9 12/15/2013   BUN 19 12/15/2013   CO2 30 12/15/2013   TSH 2.41 01/06/2011   INR 1.1* 03/01/2013   HGBA1C 6.4 03/01/2013   MICROALBUR 1.7 11/06/2008         Assessment & Plan:

## 2014-06-19 NOTE — Assessment & Plan Note (Signed)
Chronic  Continue with current prescription therapy as reflected on the Med list.  

## 2014-06-19 NOTE — Assessment & Plan Note (Signed)
Increased clonidine Risks aknowledged

## 2014-06-19 NOTE — Assessment & Plan Note (Signed)
Continue with current prescription therapy as reflected on the Med list.  

## 2014-06-19 NOTE — Assessment & Plan Note (Signed)
son died in 12/15 discussed

## 2014-07-25 ENCOUNTER — Ambulatory Visit: Payer: Medicare Other | Admitting: Internal Medicine

## 2014-07-31 ENCOUNTER — Other Ambulatory Visit: Payer: Self-pay | Admitting: Internal Medicine

## 2014-08-03 NOTE — Telephone Encounter (Signed)
Called pharmacy spoke with Ebony Hail gave md approval.../lmb

## 2014-09-01 ENCOUNTER — Other Ambulatory Visit: Payer: Self-pay | Admitting: Internal Medicine

## 2014-09-01 NOTE — Telephone Encounter (Signed)
Sorry, pt should not need xanax as already has mutl refills of ativan per med list

## 2014-09-01 NOTE — Telephone Encounter (Signed)
Ok to Rf in PCP's absence?  

## 2014-09-11 ENCOUNTER — Other Ambulatory Visit: Payer: Self-pay | Admitting: Internal Medicine

## 2014-09-13 NOTE — Telephone Encounter (Signed)
OK to fill this prescription with additional refills x1 Thank you!  

## 2014-09-13 NOTE — Telephone Encounter (Signed)
Pt called stated she need this medication due to death of her son,she just need to get through cleaning out his house. Please help

## 2014-09-14 NOTE — Telephone Encounter (Signed)
Done. Pt informed.

## 2014-11-18 ENCOUNTER — Other Ambulatory Visit: Payer: Self-pay | Admitting: Internal Medicine

## 2014-11-27 ENCOUNTER — Other Ambulatory Visit: Payer: Self-pay | Admitting: Internal Medicine

## 2014-12-20 ENCOUNTER — Other Ambulatory Visit: Payer: Self-pay | Admitting: Internal Medicine

## 2014-12-20 ENCOUNTER — Telehealth: Payer: Self-pay | Admitting: Internal Medicine

## 2014-12-20 MED ORDER — HYDROCODONE-ACETAMINOPHEN 5-325 MG PO TABS: 1.0000 | ORAL_TABLET | Freq: Two times a day (BID) | ORAL | Status: DC | PRN

## 2014-12-20 NOTE — Telephone Encounter (Signed)
Patient states that she is cleaning out her son's house who past away and she needs something for pain.  She is requesting a script for hydrocodone.  States Dr. Alain Marion has prescribed for her before.

## 2014-12-20 NOTE — Telephone Encounter (Signed)
Ok Thx 

## 2014-12-20 NOTE — Telephone Encounter (Signed)
Notified pt rx ready for pick-up.../lmb 

## 2014-12-21 NOTE — Telephone Encounter (Signed)
Called pharmacy spoke with Zenia Resides gave md approval.../lmb

## 2014-12-22 ENCOUNTER — Other Ambulatory Visit: Payer: Self-pay | Admitting: Internal Medicine

## 2014-12-29 ENCOUNTER — Other Ambulatory Visit: Payer: Self-pay | Admitting: Internal Medicine

## 2014-12-29 ENCOUNTER — Telehealth: Payer: Self-pay | Admitting: Internal Medicine

## 2014-12-29 MED ORDER — HYDROCODONE-ACETAMINOPHEN 5-325 MG PO TABS: 1.0000 | ORAL_TABLET | Freq: Two times a day (BID) | ORAL | Status: DC | PRN

## 2014-12-29 NOTE — Telephone Encounter (Signed)
Ok. OV q 4 mo Thx

## 2014-12-29 NOTE — Telephone Encounter (Signed)
Patient is requesting refill on hydrocodone  °

## 2014-12-29 NOTE — Telephone Encounter (Signed)
Last Rx was written 12/20/14 for #30. MD authorized #[id number]. Rx printed/upfront for p/u. Left detailed mess informing pt of below.

## 2015-01-23 ENCOUNTER — Other Ambulatory Visit: Payer: Self-pay | Admitting: Internal Medicine

## 2015-01-24 ENCOUNTER — Ambulatory Visit (INDEPENDENT_AMBULATORY_CARE_PROVIDER_SITE_OTHER): Payer: Medicare Other | Admitting: Internal Medicine

## 2015-01-24 VITALS — BP 120/72 | HR 87 | Temp 98.3°F | Resp 16 | Ht 62.0 in | Wt 147.0 lb

## 2015-01-24 DIAGNOSIS — G500 Trigeminal neuralgia: Secondary | ICD-10-CM | POA: Diagnosis not present

## 2015-01-24 MED ORDER — HYDROCODONE-ACETAMINOPHEN 5-325 MG PO TABS: 1.0000 | ORAL_TABLET | Freq: Two times a day (BID) | ORAL | Status: DC | PRN

## 2015-01-24 MED ORDER — CARBAMAZEPINE 100 MG PO CHEW
100.0000 mg | CHEWABLE_TABLET | Freq: Two times a day (BID) | ORAL | Status: DC
Start: 2015-01-24 — End: 2015-01-30

## 2015-01-24 NOTE — Patient Instructions (Signed)
Trigeminal Neuralgia  Trigeminal neuralgia is a nerve disorder that causes sudden attacks of severe facial pain. It is caused by damage to the trigeminal nerve, a major nerve in the face. It is more common in women and in the elderly, although it can also happen in younger patients. Attacks last from a few seconds to several minutes and can occur from a couple of times per year to several times per day. Trigeminal neuralgia can be a very distressing and disabling condition. Surgery may be needed in very severe cases if medical treatment does not give relief.  HOME CARE INSTRUCTIONS    If your caregiver prescribed medication to help prevent attacks, take as directed.   To help prevent attacks:   Chew on the unaffected side of the mouth.   Avoid touching your face.   Avoid blasts of hot or cold air.   Men may wish to grow a beard to avoid having to shave.  SEEK IMMEDIATE MEDICAL CARE IF:   Pain is unbearable and your medicine does not help.   You develop new, unexplained symptoms (problems).   You have problems that may be related to a medication you are taking.  Document Released: 06/20/2000 Document Revised: 09/15/2011 Document Reviewed: 04/20/2009  [employer] Patient Information 2015 [employer]. This information is not intended to replace advice given to you by your health care provider. Make sure you discuss any questions you have with your health care provider.

## 2015-01-24 NOTE — Progress Notes (Signed)
Pre visit review using our clinic review tool, if applicable. No additional management support is needed unless otherwise documented below in the visit note. 

## 2015-01-25 ENCOUNTER — Telehealth: Payer: Self-pay | Admitting: Internal Medicine

## 2015-01-25 ENCOUNTER — Encounter: Payer: Self-pay | Admitting: Internal Medicine

## 2015-01-25 ENCOUNTER — Other Ambulatory Visit: Payer: Self-pay | Admitting: Internal Medicine

## 2015-01-25 DIAGNOSIS — R270 Ataxia, unspecified: Secondary | ICD-10-CM

## 2015-01-25 DIAGNOSIS — G500 Trigeminal neuralgia: Secondary | ICD-10-CM

## 2015-01-25 MED ORDER — PREGABALIN 75 MG PO CAPS: 75.0000 mg | ORAL_CAPSULE | Freq: Two times a day (BID) | ORAL | Status: DC

## 2015-01-25 NOTE — Telephone Encounter (Signed)
Pt dtr informed of order for MRI and medication advisement given below.

## 2015-01-25 NOTE — Telephone Encounter (Signed)
MRI of the brain ordered. Continue Norco and carbamazepine for pain. Stop taking Neurontin and start Lyrica for better pain control. This prescription was written and will be on your desk.

## 2015-01-25 NOTE — Telephone Encounter (Signed)
Sandra [name] called to advise that patient is in extreme pain with her condition. She has been given pain meds, but it is 0% effective. She has questions around this.

## 2015-01-25 NOTE — Progress Notes (Signed)
Subjective:  Patient ID: Sandra [name], female    DOB: 1929-01-21  Age: 81 y.o. MRN: [id number]  CC: Facial Pain   HPI Sandra [name] presents for a recurrence of right facial pain for the last 1-2 weeks. She describes a diffuse stabbing sensation around her right eye, her right face, her right jaw and into her right ear. The pain is elicited by palpation and air brushing up against her face. She has had this intermittently for several years. She previously saw a neurologist and was diagnosed with trigeminal neuralgia. She has tried Neurontin without much relief from her symptoms. The last time she had this she took a course of carbamazepine and the pain went away so she stopped taking it.  Outpatient Prescriptions Prior to Visit  Medication Sig Dispense Refill  . ALPRAZolam (XANAX) 0.5 MG tablet TAKE 1 TO 2 TABLETS BY MOUTH 3 TIMES A DAY AS NEEDED FOR ANXIETY 90 tablet 1  . atenolol (TENORMIN) 100 MG tablet Take 1 tablet (100 mg total) by mouth 2 (two) times daily. 180 tablet 3  . Cholecalciferol (VITAMIN D3) 1000 UNITS tablet Take 1,000 Units by mouth daily.      . cloNIDine (CATAPRES) 0.1 MG tablet Take 1 tablet (0.1 mg total) by mouth 2 (two) times daily. For sweating and for the blood pressure. 180 tablet 3  . clopidogrel (PLAVIX) 75 MG tablet Take 1 tablet (75 mg total) by mouth daily. 90 tablet 3  . diphenoxylate-atropine (LOMOTIL) 2.5-0.025 MG per tablet Take 1 tablet by mouth 4 (four) times daily as needed for diarrhea or loose stools. 30 tablet 1  . gabapentin (NEURONTIN) 100 MG capsule TAKE 1 CAPSULE (100 MG TOTAL) BY MOUTH 3 (THREE) TIMES DAILY AS NEEDED. 270 capsule 0  . LORazepam (ATIVAN) 1 MG tablet TAKE 1 TO 2 TABLETS BY MOUTH TWICE A DAY AS NEEDED FOR ANXIETY 100 tablet 5  . losartan (COZAAR) 100 MG tablet Take 1 tablet (100 mg total) by mouth daily. 30 tablet 11  . QUEtiapine (SEROQUEL) 100 MG tablet TAKE 1 OR 2 TABLETS BY MOUTH AT BEDTIME FOR INSOMNIA 60 tablet 5  .  simvastatin (ZOCOR) 40 MG tablet Take 1 tablet (40 mg total) by mouth at bedtime. 90 tablet 3  . temazepam (RESTORIL) 30 MG capsule TAKE 2 CAPSULES BY MOUTH AT BEDTIME AS NEEDED 60 capsule 5  . traMADol (ULTRAM) 50 MG tablet TAKE 1 TO 2 TABLETS BY MOUTH 2 TIMES DAILY AS NEEDED FOR PAIN 60 tablet 2  . triamcinolone ointment (KENALOG) 0.5 % Apply topically 2 (two) times daily. 90 g 1  . vitamin B-12 (CYANOCOBALAMIN) 1000 MCG tablet Take 1,000 mcg by mouth daily.      Marland Kitchen HYDROcodone-acetaminophen (NORCO/VICODIN) 5-325 MG per tablet Take 1 tablet by mouth 2 (two) times daily as needed for moderate pain or severe pain. 60 tablet 0   No facility-administered medications prior to visit.    ROS Review of Systems  Constitutional: Negative.  Negative for fever and chills.  HENT: Positive for ear pain. Negative for drooling, ear discharge, facial swelling, sinus pressure, sore throat, trouble swallowing and voice change.   Eyes: Negative.  Negative for photophobia and visual disturbance.  Respiratory: Negative.  Negative for cough, choking, chest tightness, shortness of breath and stridor.   Cardiovascular: Negative.  Negative for chest pain, palpitations and leg swelling.  Gastrointestinal: Negative.  Negative for nausea, vomiting, abdominal pain, diarrhea, constipation and blood in stool.  Endocrine: Negative.   Genitourinary:  Negative.   Musculoskeletal: Negative.  Negative for myalgias, neck pain and neck stiffness.  Skin: Negative.  Negative for rash.  Allergic/Immunologic: Negative.   Neurological: Negative.  Negative for dizziness, tremors, seizures, syncope, facial asymmetry, speech difficulty, weakness, light-headedness, numbness and headaches.  Hematological: Negative.  Negative for adenopathy. Does not bruise/bleed easily.  Psychiatric/Behavioral: Negative.     Objective:  BP 120/72 mmHg  Pulse 87  Temp(Src) 98.3 F (36.8 C) (Oral)  Ht 5\' 2"  (1.575 m)  Wt 147 lb (66.679 kg)  BMI  26.88 kg/m2  SpO2 92%  BP Readings from Last 3 Encounters:  01/24/15 120/72  06/19/14 160/90  12/15/13 170/88    Wt Readings from Last 3 Encounters:  01/24/15 147 lb (66.679 kg)  06/19/14 150 lb (68.04 kg)  12/15/13 152 lb (68.947 kg)    Physical Exam  Constitutional: She is oriented to person, place, and time. She appears well-developed and well-nourished.  Non-toxic appearance. She does not have a sickly appearance. She does not appear ill. No distress.  HENT:  Head: Normocephalic and atraumatic.  Right Ear: Hearing, tympanic membrane, external ear and ear canal normal.  Left Ear: Hearing, tympanic membrane, external ear and ear canal normal.  Nose: Nose normal. Right sinus exhibits no maxillary sinus tenderness and no frontal sinus tenderness. Left sinus exhibits no maxillary sinus tenderness and no frontal sinus tenderness.  Mouth/Throat: Oropharynx is clear and moist. No oropharyngeal exudate or posterior oropharyngeal erythema.  Right face is tender to palpation around the eye, the malar surface, the right lower jaw, and  in front of the right ear.  Eyes: Conjunctivae are normal. Right eye exhibits no discharge. Left eye exhibits no discharge. No scleral icterus.  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 Neck supple. No JVD present. No tracheal deviation present. No thyromegaly present.  Cardiovascular: Normal rate, regular rhythm, normal heart sounds and intact distal pulses.  Exam reveals no gallop and no friction rub.   No murmur heard. Pulmonary/Chest: Effort normal. No stridor. No respiratory distress. She has no wheezes. She has no rales. She exhibits no tenderness.  Abdominal: Soft. Bowel sounds are normal. She exhibits no distension and no mass. There is no tenderness. There is no rebound and no guarding.  Musculoskeletal: Normal range of motion. She exhibits no edema or tenderness.  Lymphadenopathy:       Head (right side): No  submental, no submandibular, no tonsillar, no preauricular, no posterior auricular and no occipital adenopathy present.       Head (left side): No submental, no submandibular, no tonsillar, no preauricular, no posterior auricular and no occipital adenopathy present.    She has no cervical adenopathy.       Right cervical: No superficial cervical, no deep cervical and no posterior cervical adenopathy present.      Left cervical: No superficial cervical, no deep cervical and no posterior cervical adenopathy present.    She has no axillary adenopathy.  Neurological: She is alert and oriented to person, place, and time. She has normal reflexes. She displays no atrophy, no tremor and normal reflexes. No cranial nerve deficit or sensory deficit. She exhibits normal muscle tone. She displays no seizure activity. Coordination and gait normal. She displays no Babinski's sign on the right side.  Skin: Skin is warm and dry. No rash noted. She is not diaphoretic. No erythema. No pallor.  Psychiatric: She has a normal mood and affect. Her behavior is normal.  Judgment and thought content normal.    Lab Results  Component Value Date   WBC 6.9 12/15/2013   HGB 14.2 12/15/2013   HCT 42.0 12/15/2013   PLT 191.0 12/15/2013   GLUCOSE 101* 12/15/2013   CHOL 159 03/01/2013   TRIG 205.0* 03/01/2013   HDL 44.30 03/01/2013   LDLDIRECT 91.5 03/01/2013   LDLCALC 92 11/06/2008   ALT 14 03/01/2013   AST 14 03/01/2013   NA 142 12/15/2013   K 4.0 12/15/2013   CL 106 12/15/2013   CREATININE 0.9 12/15/2013   BUN 19 12/15/2013   CO2 30 12/15/2013   TSH 2.41 01/06/2011   INR 1.1* 03/01/2013   HGBA1C 6.4 03/01/2013   MICROALBUR 1.7 11/06/2008    Dg Chest 2 View  03/09/2012   *RADIOLOGY REPORT*  Clinical Data: Cough, shortness of breath, chest pain and fever for 2 weeks.  CHEST - 2 VIEW  Comparison: 05/25/2009 and 07/19/2008.  Findings: The heart size and mediastinal contours are stable. There are streaky bibasilar  opacities which appear chronic, although slightly progressive.  There are probable new small bilateral pleural effusions.  No edema or confluent airspace opacity is seen.  The osseous structures appear unchanged.  IMPRESSION: Progressive chronic streaky bibasilar opacities suggesting scarring, possibly from underlying bronchiectasis.  Small bilateral pleural effusions.   Original Report Authenticated By: Vivia Ewing, M.D.    Assessment & Plan:   Charlestine was seen today for facial pain.  Diagnoses and all orders for this visit:  Trigeminal neuralgia of right side of face- will acutely control the pain with Norco. Will restart carbamazepine. She will continue gabapentin if it is beneficial. I have offered her referral to neurology if needed. Orders: -     Ambulatory referral to Neurology -     HYDROcodone-acetaminophen (NORCO/VICODIN) 5-325 MG per tablet; Take 1 tablet by mouth 2 (two) times daily as needed for moderate pain or severe pain. -     carbamazepine (TEGRETOL) 100 MG chewable tablet; Chew 1 tablet (100 mg total) by mouth 2 (two) times daily.   I am having Ms. Karman start on carbamazepine. I am also having her maintain her vitamin B-12, cholecalciferol, losartan, diphenoxylate-atropine, atenolol, clopidogrel, simvastatin, triamcinolone ointment, cloNIDine, LORazepam, ALPRAZolam, QUEtiapine, temazepam, gabapentin, traMADol, and HYDROcodone-acetaminophen.  Meds ordered this encounter  Medications  . HYDROcodone-acetaminophen (NORCO/VICODIN) 5-325 MG per tablet    Sig: Take 1 tablet by mouth 2 (two) times daily as needed for moderate pain or severe pain.    Dispense:  60 tablet    Refill:  0  . carbamazepine (TEGRETOL) 100 MG chewable tablet    Sig: Chew 1 tablet (100 mg total) by mouth 2 (two) times daily.    Dispense:  60 tablet    Refill:  1     Follow-up: Return in about 4 weeks (around 02/21/2015).  Scarlette Calico, MD

## 2015-01-25 NOTE — Telephone Encounter (Signed)
Spoke to [place] and she states that pt is still in extreme pain. She stated that the only new sx that is currently noticed is unsteady gait.  Is there any other advisement that we can give the pt? [name] mentioned/thought that pt may need an MRI or other scan.

## 2015-01-26 ENCOUNTER — Other Ambulatory Visit: Payer: Self-pay

## 2015-01-26 MED ORDER — ALPRAZOLAM 0.5 MG PO TABS: ORAL_TABLET | ORAL | Status: DC

## 2015-01-29 ENCOUNTER — Telehealth: Payer: Self-pay | Admitting: Internal Medicine

## 2015-01-29 NOTE — Telephone Encounter (Signed)
Pt's daughter, Maurene Capes calling about MRI Dr. Ronnald Ramp ordered. I tried to check the status of this request, but notes just say incomplete. I forwarded a message to [facility] who handles orders/referrals.  I advised [name] that she will need to be placed on DPR for [place] to speak to her. She states her mother is refusing to take Lyrica until Dr. Alain Marion advises her to do so. She states her mother's speech has been slurred and she is a little "out of it". I advised her to take pt to ER if needed and we will call pt back with further advisement from PCP.

## 2015-01-30 ENCOUNTER — Ambulatory Visit (INDEPENDENT_AMBULATORY_CARE_PROVIDER_SITE_OTHER): Payer: Medicare Other | Admitting: Internal Medicine

## 2015-01-30 ENCOUNTER — Other Ambulatory Visit (INDEPENDENT_AMBULATORY_CARE_PROVIDER_SITE_OTHER): Payer: Medicare Other

## 2015-01-30 ENCOUNTER — Encounter: Payer: Self-pay | Admitting: Internal Medicine

## 2015-01-30 VITALS — BP 170/90 | HR 65 | Wt 144.0 lb

## 2015-01-30 DIAGNOSIS — E538 Deficiency of other specified B group vitamins: Secondary | ICD-10-CM

## 2015-01-30 DIAGNOSIS — E559 Vitamin D deficiency, unspecified: Secondary | ICD-10-CM

## 2015-01-30 DIAGNOSIS — R51 Headache: Secondary | ICD-10-CM

## 2015-01-30 DIAGNOSIS — G4452 New daily persistent headache (NDPH): Secondary | ICD-10-CM

## 2015-01-30 DIAGNOSIS — F05 Delirium due to known physiological condition: Secondary | ICD-10-CM | POA: Diagnosis not present

## 2015-01-30 DIAGNOSIS — R270 Ataxia, unspecified: Secondary | ICD-10-CM | POA: Diagnosis not present

## 2015-01-30 DIAGNOSIS — R519 Headache, unspecified: Secondary | ICD-10-CM | POA: Insufficient documentation

## 2015-01-30 MED ORDER — INDOMETHACIN 25 MG PO CAPS: 25.0000 mg | ORAL_CAPSULE | Freq: Three times a day (TID) | ORAL | Status: DC

## 2015-01-30 MED ORDER — OXYCODONE HCL 5 MG PO TABS: 5.0000 mg | ORAL_TABLET | Freq: Four times a day (QID) | ORAL | Status: DC | PRN

## 2015-01-30 NOTE — Progress Notes (Signed)
Subjective:  Patient ID: Sandra [name], female    DOB: Aug 22, 1928  Age: 81 y.o. MRN: [id number]  CC: Facial Pain and Gait Problem   HPI Sandra [name] presents for severe B head pain (temples, cheeks, forehead) 9/10 x 2 wks. "I'm feeling "drunk" from the meds". She spoke to her dead husband last night. C/o nausea. Pt  Did not get Lyrica due to cost. Neurol ref and head MRI - pending.  Outpatient Prescriptions Prior to Visit  Medication Sig Dispense Refill  . ALPRAZolam (XANAX) 0.5 MG tablet TAKE 1 TO 2 TABLETS BY MOUTH 3 TIMES A DAY AS NEEDED FOR ANXIETY 90 tablet 1  . atenolol (TENORMIN) 100 MG tablet Take 1 tablet (100 mg total) by mouth 2 (two) times daily. 180 tablet 3  . carbamazepine (TEGRETOL) 100 MG chewable tablet Chew 1 tablet (100 mg total) by mouth 2 (two) times daily. 60 tablet 1  . Cholecalciferol (VITAMIN D3) 1000 UNITS tablet Take 1,000 Units by mouth daily.      . cloNIDine (CATAPRES) 0.1 MG tablet Take 1 tablet (0.1 mg total) by mouth 2 (two) times daily. For sweating and for the blood pressure. 180 tablet 3  . HYDROcodone-acetaminophen (NORCO/VICODIN) 5-325 MG per tablet Take 1 tablet by mouth 2 (two) times daily as needed for moderate pain or severe pain. 60 tablet 0  . LORazepam (ATIVAN) 1 MG tablet TAKE 1 TO 2 TABLETS BY MOUTH TWICE A DAY AS NEEDED FOR ANXIETY 100 tablet 5  . QUEtiapine (SEROQUEL) 100 MG tablet TAKE 1 OR 2 TABLETS BY MOUTH AT BEDTIME FOR INSOMNIA 60 tablet 5  . simvastatin (ZOCOR) 40 MG tablet Take 1 tablet (40 mg total) by mouth at bedtime. 90 tablet 3  . temazepam (RESTORIL) 30 MG capsule TAKE 2 CAPSULES BY MOUTH AT BEDTIME AS NEEDED 60 capsule 5  . traMADol (ULTRAM) 50 MG tablet TAKE 1 TO 2 TABLETS BY MOUTH 2 TIMES DAILY AS NEEDED FOR PAIN 60 tablet 2  . triamcinolone ointment (KENALOG) 0.5 % Apply topically 2 (two) times daily. 90 g 1  . vitamin B-12 (CYANOCOBALAMIN) 1000 MCG tablet Take 1,000 mcg by mouth daily.      . clopidogrel (PLAVIX) 75 MG  tablet Take 1 tablet (75 mg total) by mouth daily. (Patient not taking: Reported on 01/30/2015) 90 tablet 3  . diphenoxylate-atropine (LOMOTIL) 2.5-0.025 MG per tablet Take 1 tablet by mouth 4 (four) times daily as needed for diarrhea or loose stools. (Patient not taking: Reported on 01/30/2015) 30 tablet 1  . losartan (COZAAR) 100 MG tablet Take 1 tablet (100 mg total) by mouth daily. 30 tablet 11  . pregabalin (LYRICA) 75 MG capsule Take 1 capsule (75 mg total) by mouth 2 (two) times daily. (Patient not taking: Reported on 01/30/2015) 90 capsule 2   No facility-administered medications prior to visit.    ROS Review of Systems  Constitutional: Positive for fatigue. Negative for chills, activity change, appetite change and unexpected weight change.  HENT: Positive for sinus pressure. Negative for congestion, facial swelling, mouth sores, nosebleeds and trouble swallowing.   Eyes: Positive for pain. Negative for visual disturbance.  Respiratory: Negative for cough, chest tightness and shortness of breath.   Gastrointestinal: Negative for nausea and abdominal pain.  Genitourinary: Negative for frequency, difficulty urinating and vaginal pain.  Musculoskeletal: Positive for arthralgias. Negative for back pain, gait problem and neck stiffness.  Skin: Negative for pallor and rash.  Neurological: Positive for dizziness, weakness, light-headedness and headaches.  Negative for tremors, seizures, syncope, facial asymmetry and numbness.  Psychiatric/Behavioral: Positive for confusion and decreased concentration. Negative for suicidal ideas, sleep disturbance and dysphoric mood. The patient is not nervous/anxious.     Objective:  BP 170/90 mmHg  Pulse 65  Wt 144 lb (65.318 kg)  SpO2 94%  BP Readings from Last 3 Encounters:  01/30/15 170/90  01/24/15 120/72  06/19/14 160/90    Wt Readings from Last 3 Encounters:  01/30/15 144 lb (65.318 kg)  01/24/15 147 lb (66.679 kg)  06/19/14 150 lb (68.04  kg)    Physical Exam  Constitutional: She appears well-developed. She appears distressed.  HENT:  Head: Normocephalic.  Right Ear: External ear normal.  Left Ear: External ear normal.  Nose: Nose normal.  Mouth/Throat: Oropharynx is clear and moist. No oropharyngeal exudate.  Eyes: Conjunctivae are normal. Pupils are equal, round, and reactive to light. Right eye exhibits no discharge. Left eye exhibits no discharge.  Neck: Normal range of motion. Neck supple. No JVD present. No tracheal deviation present. No thyromegaly present.  Cardiovascular: Normal rate, regular rhythm and normal heart sounds.   Pulmonary/Chest: No stridor. No respiratory distress. She has no wheezes.  Abdominal: Soft. Bowel sounds are normal. She exhibits no distension and no mass. There is no tenderness. There is no rebound and no guarding.  Musculoskeletal: She exhibits tenderness. She exhibits no edema.  Lymphadenopathy:    She has no cervical adenopathy.  Neurological: She displays normal reflexes. No cranial nerve deficit. She exhibits normal muscle tone. Coordination abnormal.  Skin: No rash noted. No erythema.  Psychiatric: She has a normal mood and affect. Her behavior is normal. Judgment and thought content normal.  B temples and cheeks are tender to palpation Pt is "slow" compare to usual herself Ataxic Disoriented to date/weekday, otherwise WNL   Lab Results  Component Value Date   WBC 6.9 12/15/2013   HGB 14.2 12/15/2013   HCT 42.0 12/15/2013   PLT 191.0 12/15/2013   GLUCOSE 101* 12/15/2013   CHOL 159 03/01/2013   TRIG 205.0* 03/01/2013   HDL 44.30 03/01/2013   LDLDIRECT 91.5 03/01/2013   LDLCALC 92 11/06/2008   ALT 14 03/01/2013   AST 14 03/01/2013   NA 142 12/15/2013   K 4.0 12/15/2013   CL 106 12/15/2013   CREATININE 0.9 12/15/2013   BUN 19 12/15/2013   CO2 30 12/15/2013   TSH 2.41 01/06/2011   INR 1.1* 03/01/2013   HGBA1C 6.4 03/01/2013   MICROALBUR 1.7 11/06/2008    Dg  Chest 2 View  03/09/2012   *RADIOLOGY REPORT*  Clinical Data: Cough, shortness of breath, chest pain and fever for 2 weeks.  CHEST - 2 VIEW  Comparison: 05/25/2009 and 07/19/2008.  Findings: The heart size and mediastinal contours are stable. There are streaky bibasilar opacities which appear chronic, although slightly progressive.  There are probable new small bilateral pleural effusions.  No edema or confluent airspace opacity is seen.  The osseous structures appear unchanged.  IMPRESSION: Progressive chronic streaky bibasilar opacities suggesting scarring, possibly from underlying bronchiectasis.  Small bilateral pleural effusions.   Original Report Authenticated By: Vivia Ewing, M.D.    Assessment & Plan:   There are no diagnoses linked to this encounter. I am having Sandra [name] maintain her vitamin B-12, cholecalciferol, losartan, diphenoxylate-atropine, atenolol, clopidogrel, simvastatin, triamcinolone ointment, cloNIDine, LORazepam, QUEtiapine, temazepam, traMADol, HYDROcodone-acetaminophen, carbamazepine, pregabalin, ALPRAZolam, gabapentin, and traZODone.  Meds ordered this encounter  Medications  . gabapentin (NEURONTIN) 100 MG capsule  Sig: Take 1 capsule by mouth 3 (three) times daily as needed.    Refill:  0  . traZODone (DESYREL) 100 MG tablet    Sig: Take 1 tablet by mouth at bedtime.    Refill:  5   A complex case  Follow-up: No Follow-up on file.  Walker Kehr, MD

## 2015-01-30 NOTE — Assessment & Plan Note (Signed)
B12 po Labs

## 2015-01-30 NOTE — Progress Notes (Signed)
Pre visit review using our clinic review tool, if applicable. No additional management support is needed unless otherwise documented below in the visit note. 

## 2015-01-30 NOTE — Assessment & Plan Note (Addendum)
Head MRI pending Neurol ref Reduce meds - - d/c Neurontin,Hydrocodone, Tegretol. Hold Trazodone. Hold Simvastatin. Family informed

## 2015-01-30 NOTE — Assessment & Plan Note (Addendum)
  Intolerant to po steroids Labs ordered MRI and Neurol ref pending  Oxycodone prn  Potential benefits of a short/ long term opioids use as well as potential risks (i.e. addiction risk, apnea etc) and complications (i.e. Somnolence, constipation and others) were explained to the patient and were aknowledged. Indomethacin tid

## 2015-01-30 NOTE — Telephone Encounter (Signed)
Patient has called back regarding the pain she has in her face. I was going to check with [facility] regarding her MRI, but she is really wanting to see Dr. Alain Marion today. I tried to schedule for tomorrow, but she wants to come in today. Can you fit her in? Please call patient at [phone]

## 2015-01-30 NOTE — Telephone Encounter (Signed)
Ok to work in today at [employer] per PCP. Alfredia Client scheduler advised to call pt and schedule.

## 2015-01-30 NOTE — Assessment & Plan Note (Addendum)
7/16 due to meds interaction - mild Labs Stay w/family tonight  Reduce meds - - d/c Neurontin,Hydrocodone, Tegretol. Hold Trazodone. Hold Simvastatin. Family informed To ER if worse

## 2015-01-30 NOTE — Patient Instructions (Signed)
Go to ER if worse 

## 2015-01-31 ENCOUNTER — Ambulatory Visit: Payer: Medicare Other | Admitting: Internal Medicine

## 2015-01-31 ENCOUNTER — Other Ambulatory Visit: Payer: Self-pay | Admitting: Internal Medicine

## 2015-01-31 LAB — CBC WITH DIFFERENTIAL/PLATELET
BASOS ABS: 0 10*3/uL (ref 0.0–0.1)
Basophils Relative: 0.5 % (ref 0.0–3.0)
Eosinophils Absolute: 0.1 10*3/uL (ref 0.0–0.7)
Eosinophils Relative: 1.4 % (ref 0.0–5.0)
HCT: 39.9 % (ref 36.0–46.0)
Hemoglobin: 13.4 g/dL (ref 12.0–15.0)
Lymphocytes Relative: 32.2 % (ref 12.0–46.0)
Lymphs Abs: 1.8 10*3/uL (ref 0.7–4.0)
MCHC: 33.7 g/dL (ref 30.0–36.0)
MCV: 98.2 fl (ref 78.0–100.0)
MONO ABS: 0.4 10*3/uL (ref 0.1–1.0)
Monocytes Relative: 7.8 % (ref 3.0–12.0)
NEUTROS ABS: 3.2 10*3/uL (ref 1.4–7.7)
NEUTROS PCT: 58.1 % (ref 43.0–77.0)
Platelets: 204 10*3/uL (ref 150.0–400.0)
RBC: 4.06 Mil/uL (ref 3.87–5.11)
RDW: 13.4 % (ref 11.5–15.5)
WBC: 5.5 10*3/uL (ref 4.0–10.5)

## 2015-01-31 LAB — HEPATIC FUNCTION PANEL
ALT: 16 U/L (ref 0–35)
AST: 14 U/L (ref 0–37)
Albumin: 4.3 g/dL (ref 3.5–5.2)
Alkaline Phosphatase: 50 U/L (ref 39–117)
Bilirubin, Direct: 0 mg/dL (ref 0.0–0.3)
Total Bilirubin: 0.3 mg/dL (ref 0.2–1.2)
Total Protein: 6.6 g/dL (ref 6.0–8.3)

## 2015-01-31 LAB — VITAMIN D 25 HYDROXY (VIT D DEFICIENCY, FRACTURES): VITD: 23.37 ng/mL — ABNORMAL LOW (ref 30.00–100.00)

## 2015-01-31 LAB — VITAMIN B12: Vitamin B-12: >1500 pg/mL — ABNORMAL HIGH (ref 211–911)

## 2015-01-31 LAB — BASIC METABOLIC PANEL
BUN: 21 mg/dL (ref 6–23)
CO2: 31 mEq/L (ref 19–32)
CREATININE: 0.95 mg/dL (ref 0.40–1.20)
Calcium: 9.2 mg/dL (ref 8.4–10.5)
Chloride: 102 mEq/L (ref 96–112)
GFR: 59.61 mL/min — ABNORMAL LOW (ref >60.00)
Glucose, Bld: 121 mg/dL — ABNORMAL HIGH (ref 70–99)
POTASSIUM: 4.4 meq/L (ref 3.5–5.1)
Sodium: 140 mEq/L (ref 135–145)

## 2015-01-31 LAB — TSH: TSH: 3.51 u[IU]/mL (ref 0.35–4.50)

## 2015-01-31 LAB — SEDIMENTATION RATE: Sed Rate: 8 mm/hr (ref 0–22)

## 2015-01-31 MED ORDER — ERGOCALCIFEROL 1.25 MG (50000 UT) PO CAPS: 50000.0000 [IU] | ORAL_CAPSULE | ORAL | Status: DC

## 2015-02-02 ENCOUNTER — Telehealth: Payer: Self-pay | Admitting: Internal Medicine

## 2015-02-02 ENCOUNTER — Other Ambulatory Visit: Payer: Self-pay | Admitting: *Deleted

## 2015-02-02 ENCOUNTER — Encounter: Payer: Self-pay | Admitting: Neurology

## 2015-02-02 ENCOUNTER — Ambulatory Visit (INDEPENDENT_AMBULATORY_CARE_PROVIDER_SITE_OTHER): Payer: Medicare Other | Admitting: Neurology

## 2015-02-02 VITALS — BP 122/76 | HR 68 | Ht 62.0 in | Wt 143.0 lb

## 2015-02-02 DIAGNOSIS — G629 Polyneuropathy, unspecified: Secondary | ICD-10-CM

## 2015-02-02 DIAGNOSIS — G500 Trigeminal neuralgia: Secondary | ICD-10-CM

## 2015-02-02 MED ORDER — OXCARBAZEPINE 150 MG PO TABS: ORAL_TABLET | ORAL | Status: DC

## 2015-02-02 NOTE — Progress Notes (Signed)
NEUROLOGY CONSULTATION NOTE  Sandra [name] MRN: [id number] DOB: 1929/05/22  Referring provider: Dr. Lew Dawes Primary care provider: Dr. Lew Dawes  Reason for consult:  headaches  Dear Dr Alain Marion:  Thank you for your kind referral of Sandra [name] for consultation of the above symptoms. Although her history is well known to you, please allow me to reiterate it for the purpose of our medical record. The patient was accompanied to the clinic by her daughter who also provides collateral information. Records and images were personally reviewed where available.  HISTORY OF PRESENT ILLNESS: This is an 81 year old right-handed woman with a history of hypertension, hyperlipidemia, anxiety, trigeminal neuralgia diagnosed in 2013, presenting with similar facial pain on the right side of her face that started at the beginning of the month. She had seen her PCP and reported stabbing pain around her right eye, right side of her face, and right ear, with pain elicited by palpation and air brushing against her face. She had tried Neurontin without much relief, and in 2013 took a course of carbamazepine with good effect.  She discontinued the medication when pain resolved. She was restarted on carbamazepine 100mg  BID and prn Norco. She reported continued extreme pain the next day and was instructed to discontinue Neurontin and start Lyrica, which the patient refused to take. Her daughter noticed slurred speech and confusion, where she walked around the house looking for her deceased husband and son. She was instructed to discontinue Neurontin, Norco, and Tegretol. She was also reporting gait instability, which is a chronic condition for her, but worse in the past few weeks. No falls. She was given a prescription for oxycodone 5mg  which she takes every 6 hours, and Indomethacin 25mg  TID. She reports the pain is better than what it was, it is now occurring intermittently instead of constantly.  Yesterday she had no pain until the afternoon, today it is a 5/10. She was initially having sharp pain in her ears, R>L, but now denies any ear pain or hearing change. She was starting to have some pain on the left side of her face. There was initially some nausea, no vomiting. She denies any eye pain or vision changes. Sleep is overall okay.   She has had gait instability since at least 2013, but has noticed her legs are weak this morning. She has chronic back pain, no neck pain. No bowel or bladder incontinence but noticed urinary frequency last night, no dysuria. No further confusion. She denies any recent injections or head injuries. She denies any paresthesias in her extremities, no diplopia, dysarthria, dysphagia. No family history of similar symptoms.  Laboratory Data: Lab Results  Component Value Date   WBC 5.5 01/30/2015   HGB 13.4 01/30/2015   HCT 39.9 01/30/2015   MCV 98.2 01/30/2015   PLT 204.0 01/30/2015     Chemistry      Component Value Date/Time   NA 140 01/30/2015 1737   K 4.4 01/30/2015 1737   CL 102 01/30/2015 1737   CO2 31 01/30/2015 1737   BUN 21 01/30/2015 1737   CREATININE 0.95 01/30/2015 1737      Component Value Date/Time   CALCIUM 9.2 01/30/2015 1737   ALKPHOS 50 01/30/2015 1737   AST 14 01/30/2015 1737   ALT 16 01/30/2015 1737   BILITOT 0.3 01/30/2015 1737     Lab Results  Component Value Date   ESRSEDRATE 8 01/30/2015   Lab Results  Component Value Date   TSH  3.51 01/30/2015   Lab Results  Component Value Date   VITAMINB12 >1500* 01/30/2015   Lab Results  Component Value Date   HGBA1C 6.4 03/01/2013     PAST MEDICAL HISTORY: Past Medical History  Diagnosis Date  . Colon polyps   . Hyperlipidemia   . HTN (hypertension)   . Osteoporosis   . Anxiety   . Vitamin D deficiency   . Strabismus     L eye  . LBP (low back pain) 2011    right  . Peripheral neuropathy   . Vitamin B12 deficiency     PAST SURGICAL HISTORY: Past Surgical  History  Procedure Laterality Date  . Abdominal hysterectomy      MEDICATIONS: Current Outpatient Prescriptions on File Prior to Visit  Medication Sig Dispense Refill  . ALPRAZolam (XANAX) 0.5 MG tablet TAKE 1 TO 2 TABLETS BY MOUTH 3 TIMES A DAY AS NEEDED FOR ANXIETY 90 tablet 1  . atenolol (TENORMIN) 100 MG tablet Take 1 tablet (100 mg total) by mouth 2 (two) times daily. 180 tablet 3  . Cholecalciferol (VITAMIN D3) 1000 UNITS tablet Take 1,000 Units by mouth daily.      . cloNIDine (CATAPRES) 0.1 MG tablet Take 1 tablet (0.1 mg total) by mouth 2 (two) times daily. For sweating and for the blood pressure. 180 tablet 3  . clopidogrel (PLAVIX) 75 MG tablet Take 1 tablet (75 mg total) by mouth daily. 90 tablet 3  . ergocalciferol (VITAMIN D2) 50000 UNITS capsule Take 1 capsule (50,000 Units total) by mouth once a week. 6 capsule 0  . indomethacin (INDOCIN) 25 MG capsule Take 1 capsule (25 mg total) by mouth 3 (three) times daily. 60 capsule 1  . oxyCODONE (OXY IR/ROXICODONE) 5 MG immediate release tablet Take 1 tablet (5 mg total) by mouth every 6 (six) hours as needed for severe pain. 60 tablet 0  . QUEtiapine (SEROQUEL) 100 MG tablet TAKE 1 OR 2 TABLETS BY MOUTH AT BEDTIME FOR INSOMNIA 60 tablet 5  . simvastatin (ZOCOR) 40 MG tablet Take 1 tablet (40 mg total) by mouth at bedtime. 90 tablet 3  . vitamin B-12 (CYANOCOBALAMIN) 1000 MCG tablet Take 1,000 mcg by mouth daily.      Marland Kitchen losartan (COZAAR) 100 MG tablet Take 1 tablet (100 mg total) by mouth daily. 30 tablet 11   No current facility-administered medications on file prior to visit.    ALLERGIES: Allergies  Allergen Reactions  . Prednisone Shortness Of Breath and Other (See Comments)    dyspnea  . Aspirin     Upset stomach  . Azithromycin     REACTION: does not work She can take Biaxin  . Enalapril Maleate     REACTION: cough  . Penicillins   . Trazodone And Nefazodone     Peeing a lot    FAMILY HISTORY: Family History    Problem Relation Age of Onset  . Hypertension    . Heart disease Mother 58    chf  . Early death Father   . Heart disease Father 47    mi  . Diabetes Sister   . Heart disease Sister     chf    SOCIAL HISTORY: History   Social History  . Marital Status: Widowed    Spouse Name: N/A  . Number of Children: N/A  . Years of Education: N/A   Occupational History  . Retired     Therapist, art   Social History Main Topics  .  Smoking status: Never Smoker   . Smokeless tobacco: Not on file  . Alcohol Use: No  . Drug Use: No  . Sexual Activity: No   Other Topics Concern  . Not on file   Social History Narrative   Single widow   Retired   Occupation: Therapist, art    REVIEW OF SYSTEMS: Constitutional: No fevers, chills, or sweats, no generalized fatigue, change in appetite Eyes: No visual changes, double vision, eye pain Ear, nose and throat: No hearing loss, ear pain, nasal congestion, sore throat Cardiovascular: No chest pain, palpitations Respiratory:  No shortness of breath at rest or with exertion, wheezes GastrointestinaI: + nausea,no vomiting, diarrhea, abdominal pain, fecal incontinence Genitourinary:  No dysuria, urinary retention or frequency Musculoskeletal:  No neck pain, +back pain Integumentary: No rash, pruritus, skin lesions Neurological: as above Psychiatric: No depression, insomnia, anxiety Endocrine: No palpitations, fatigue, diaphoresis, mood swings, change in appetite, change in weight, increased thirst Hematologic/Lymphatic:  No anemia, purpura, petechiae. Allergic/Immunologic: no itchy/runny eyes, nasal congestion, recent allergic reactions, rashes  PHYSICAL EXAM: Filed Vitals:   02/02/15 0845  BP: 122/76  Pulse: 68   General: No acute distress Head:  Normocephalic/atraumatic Eyes: Fundoscopic exam shows bilateral sharp discs, no vessel changes, exudates, or hemorrhages Neck: supple, no paraspinal tenderness, full range of  motion Back: No paraspinal tenderness Heart: regular rate and rhythm Lungs: Clear to auscultation bilaterally. Vascular: No carotid bruits. Skin/Extremities: No rash, no edema Neurological Exam: Mental status: alert and oriented to person, place, and time, no dysarthria or aphasia, Fund of knowledge is appropriate.  Recent and remote memory are intact.  Attention and concentration are normal.    Able to name objects and repeat phrases. Cranial nerves: CN I: not tested CN II: pupils equal, round and reactive to light, visual fields intact, fundi unremarkable. CN III, IV, VI:  full range of motion, no nystagmus, no ptosis CN V: hyperesthesia to pin and cold on the right V1-3 distribution CN VII: upper and lower face symmetric CN VIII: hearing intact to finger rub CN IX, X: gag intact, uvula midline CN XI: sternocleidomastoid and trapezius muscles intact CN XII: tongue midline Bulk & Tone: normal, no fasciculations. Motor: 5/5 throughout with no pronator drift. Sensation: decreased pin to both ankles R>L, decreased vibration to ankles bilaterally, otherwise intact to light touch, pin, and joint position sense.  No extinction to double simultaneous stimulation.  Romberg test negative Deep Tendon Reflexes: +2 both UE, +1 bilateral patella and right ankle jerk, absent left ankle jerk, no ankle clonus Plantar responses: downgoing bilaterally Cerebellar: no incoordination on finger to nose testing, no dysdiadochokinesia Gait: slow, cautious, unsteady, unable to tandem walk Tremor: none  IMPRESSION: This is an 81 year old right-handed woman with a history of hypertension, hyperlipidemia, anxiety, trigeminal neuralgia diagnosed in 2013, presenting with similar facial pain on the right side of her face, consistent with trigeminal neuralgia. She is now reporting pain on the left side as well. Neurological exam shows hyperesthesia on the right, as well as length-dependent neuropathy. With new symptoms  on the left and gait instability, agree with MRI brain with and without contrast to assess for focal structural abnormality. She will start Trileptal for trigeminal neuralgia, 150mg  BID x 1 week, then increase to 2 tabs BID. Gait instability likely due to peripheral neuropathy with acute worsening due to medication side effects. She may benefit from balance therapy. She will follow-up in 1 month and knows to call our office for any problems.  Thank you for allowing me to participate in the care of this patient. Please do not hesitate to call for any questions or concerns.   Ellouise Newer, M.D.  CC: Dr. Alain Marion

## 2015-02-02 NOTE — Telephone Encounter (Signed)
Sandra [name] called to ask that we call her directly with anything to do with her upcoming appts.

## 2015-02-02 NOTE — Patient Instructions (Signed)
1. Start Trileptal 150mg : Take 1 tablet twice a day for 1 week, then increase to 2 tablets twice a day 2. Let our office know if MRI has been ordered, otherwise, we will schedule you for MRI brain with and without contrast 3. Follow-up in 1 month, call for any problems

## 2015-02-03 ENCOUNTER — Encounter: Payer: Self-pay | Admitting: Neurology

## 2015-02-03 DIAGNOSIS — G629 Polyneuropathy, unspecified: Secondary | ICD-10-CM | POA: Insufficient documentation

## 2015-02-06 ENCOUNTER — Telehealth: Payer: Self-pay

## 2015-02-06 NOTE — Telephone Encounter (Signed)
Tamela with [facility] called requesting to know if ok to change MRI order to w/wo due being able to get better images that way. Please advise if ok. Thanks

## 2015-02-06 NOTE — Telephone Encounter (Signed)
Yes

## 2015-02-06 NOTE — Telephone Encounter (Signed)
[facility] notified.  

## 2015-02-08 ENCOUNTER — Ambulatory Visit
Admission: RE | Admit: 2015-02-08 | Discharge: 2015-02-08 | Disposition: A | Discharge status: Home / Self Care | Payer: Medicare Other | Source: Ambulatory Visit | Attending: Internal Medicine | Admitting: Internal Medicine

## 2015-02-08 DIAGNOSIS — R270 Ataxia, unspecified: Secondary | ICD-10-CM

## 2015-02-08 DIAGNOSIS — G500 Trigeminal neuralgia: Secondary | ICD-10-CM

## 2015-02-08 MED ORDER — GADOBENATE DIMEGLUMINE 529 MG/ML IV SOLN
14.0000 mL | Freq: Once | INTRAVENOUS | Status: AC | PRN
  Administered 2015-02-08: 14 mL via INTRAVENOUS

## 2015-02-09 ENCOUNTER — Telehealth: Payer: Self-pay

## 2015-02-09 NOTE — Telephone Encounter (Signed)
Spoke with pt daughter. Notified of results

## 2015-02-09 NOTE — Telephone Encounter (Signed)
-----   Message from Janith Lima, MD sent at 02/09/2015  9:17 AM EDT ----- Please let her know that the MRI did not show anything that would be causing her facial pain.

## 2015-02-09 NOTE — Telephone Encounter (Signed)
A user error has taken place.

## 2015-02-12 ENCOUNTER — Other Ambulatory Visit: Payer: Medicare Other

## 2015-02-12 ENCOUNTER — Ambulatory Visit: Payer: Medicare Other | Admitting: Internal Medicine

## 2015-02-14 ENCOUNTER — Ambulatory Visit (INDEPENDENT_AMBULATORY_CARE_PROVIDER_SITE_OTHER): Payer: Medicare Other | Admitting: Internal Medicine

## 2015-02-14 ENCOUNTER — Encounter: Payer: Self-pay | Admitting: Internal Medicine

## 2015-02-14 VITALS — BP 190/94 | HR 90 | Wt 139.0 lb

## 2015-02-14 DIAGNOSIS — F05 Delirium due to known physiological condition: Secondary | ICD-10-CM

## 2015-02-14 DIAGNOSIS — I10 Essential (primary) hypertension: Secondary | ICD-10-CM

## 2015-02-14 DIAGNOSIS — K5900 Constipation, unspecified: Secondary | ICD-10-CM | POA: Insufficient documentation

## 2015-02-14 DIAGNOSIS — E538 Deficiency of other specified B group vitamins: Secondary | ICD-10-CM | POA: Diagnosis not present

## 2015-02-14 DIAGNOSIS — G500 Trigeminal neuralgia: Secondary | ICD-10-CM

## 2015-02-14 DIAGNOSIS — K5901 Slow transit constipation: Secondary | ICD-10-CM

## 2015-02-14 MED ORDER — LUBIPROSTONE 24 MCG PO CAPS: 24.0000 ug | ORAL_CAPSULE | Freq: Two times a day (BID) | ORAL | Status: DC

## 2015-02-14 MED ORDER — INDOMETHACIN 25 MG PO CAPS: 25.0000 mg | ORAL_CAPSULE | Freq: Three times a day (TID) | ORAL | Status: DC | PRN

## 2015-02-14 NOTE — Assessment & Plan Note (Signed)
Better Off opioids

## 2015-02-14 NOTE — Assessment & Plan Note (Signed)
On B12 

## 2015-02-14 NOTE — Assessment & Plan Note (Signed)
Amitiza prn

## 2015-02-14 NOTE — Assessment & Plan Note (Addendum)
Risks associated with treatment noncompliance were discussed. Compliance was encouraged. Re-start Cozaar, Catapress

## 2015-02-14 NOTE — Progress Notes (Signed)
Pre visit review using our clinic review tool, if applicable. No additional management support is needed unless otherwise documented below in the visit note. 

## 2015-02-14 NOTE — Assessment & Plan Note (Signed)
Resolved

## 2015-02-14 NOTE — Progress Notes (Signed)
Subjective:  Patient ID: Sandra [name], female    DOB: [birth year]  Age: 81 y.o. MRN: [id number]  CC: No chief complaint on file.   HPI DALY WHIPKEY presents for a f/u of severe B head pain due to trigeminal neuralgia (temples, cheeks, forehead) 9/10 x 2-3 wks - much better now, no pain. "I was feeling "drunk" from oxycodone". Indocin helped. C/o urges to have BM, constipated x 1 week Pt is grieving her son's death...  Outpatient Prescriptions Prior to Visit  Medication Sig Dispense Refill  . ALPRAZolam (XANAX) 0.5 MG tablet TAKE 1 TO 2 TABLETS BY MOUTH 3 TIMES A DAY AS NEEDED FOR ANXIETY 90 tablet 1  . Ascorbic Acid (VITAMIN C) 100 MG tablet Take 100 mg by mouth daily.    Marland Kitchen atenolol (TENORMIN) 100 MG tablet Take 1 tablet (100 mg total) by mouth 2 (two) times daily. 180 tablet 3  . Cholecalciferol (VITAMIN D3) 1000 UNITS tablet Take 1,000 Units by mouth daily.      . cloNIDine (CATAPRES) 0.1 MG tablet Take 1 tablet (0.1 mg total) by mouth 2 (two) times daily. For sweating and for the blood pressure. 180 tablet 3  . ergocalciferol (VITAMIN D2) 50000 UNITS capsule Take 1 capsule (50,000 Units total) by mouth once a week. 6 capsule 0  . QUEtiapine (SEROQUEL) 100 MG tablet TAKE 1 OR 2 TABLETS BY MOUTH AT BEDTIME FOR INSOMNIA 60 tablet 5  . vitamin B-12 (CYANOCOBALAMIN) 1000 MCG tablet Take 1,000 mcg by mouth daily.      Marland Kitchen oxyCODONE (OXY IR/ROXICODONE) 5 MG immediate release tablet Take 1 tablet (5 mg total) by mouth every 6 (six) hours as needed for severe pain. 60 tablet 0  . clopidogrel (PLAVIX) 75 MG tablet Take 1 tablet (75 mg total) by mouth daily. (Patient not taking: Reported on 02/14/2015) 90 tablet 3  . losartan (COZAAR) 100 MG tablet Take 1 tablet (100 mg total) by mouth daily. 30 tablet 11  . OXcarbazepine (TRILEPTAL) 150 MG tablet Take 1 tablet twice a day for 1 week, then increase to 2 tablets twice a day (Patient not taking: Reported on 02/14/2015) 120 tablet 4  . simvastatin  (ZOCOR) 40 MG tablet Take 1 tablet (40 mg total) by mouth at bedtime. (Patient not taking: Reported on 02/14/2015) 90 tablet 3  . indomethacin (INDOCIN) 25 MG capsule Take 1 capsule (25 mg total) by mouth 3 (three) times daily. (Patient not taking: Reported on 02/14/2015) 60 capsule 1   No facility-administered medications prior to visit.    ROS Review of Systems  Constitutional: Positive for fatigue. Negative for chills, activity change, appetite change and unexpected weight change.  HENT: Positive for sinus pressure. Negative for congestion, facial swelling, mouth sores, nosebleeds and trouble swallowing.   Eyes: Positive for pain. Negative for visual disturbance.  Respiratory: Negative for cough, chest tightness and shortness of breath.   Gastrointestinal: Negative for nausea and abdominal pain.  Genitourinary: Negative for frequency, difficulty urinating and vaginal pain.  Musculoskeletal: Positive for arthralgias. Negative for back pain, gait problem and neck stiffness.  Skin: Negative for pallor and rash.  Neurological: Positive for dizziness, weakness, light-headedness and headaches. Negative for tremors, seizures, syncope, facial asymmetry and numbness.  Psychiatric/Behavioral: Positive for confusion and decreased concentration. Negative for suicidal ideas, sleep disturbance and dysphoric mood. The patient is not nervous/anxious.     Objective:  BP 190/94 mmHg  Pulse 90  Wt 139 lb (63.05 kg)  SpO2 95%  BP Readings  from Last 3 Encounters:  02/14/15 190/94  02/02/15 122/76  01/30/15 170/90    Wt Readings from Last 3 Encounters:  02/14/15 139 lb (63.05 kg)  02/02/15 143 lb (64.864 kg)  01/30/15 144 lb (65.318 kg)    Physical Exam  Constitutional: She appears well-developed. She appears distressed.  HENT:  Head: Normocephalic.  Right Ear: External ear normal.  Left Ear: External ear normal.  Nose: Nose normal.  Mouth/Throat: Oropharynx is clear and moist. No  oropharyngeal exudate.  Eyes: Conjunctivae are normal. Pupils are equal, round, and reactive to light. Right eye exhibits no discharge. Left eye exhibits no discharge.  Neck: Normal range of motion. Neck supple. No JVD present. No tracheal deviation present. No thyromegaly present.  Cardiovascular: Normal rate, regular rhythm and normal heart sounds.   Pulmonary/Chest: No stridor. No respiratory distress. She has no wheezes.  Abdominal: Soft. Bowel sounds are normal. She exhibits no distension and no mass. There is no tenderness. There is no rebound and no guarding.  Musculoskeletal: She exhibits tenderness. She exhibits no edema.  Lymphadenopathy:    She has no cervical adenopathy.  Neurological: She displays normal reflexes. No cranial nerve deficit. She exhibits normal muscle tone. Coordination abnormal.  Skin: No rash noted. No erythema.  Psychiatric: She has a normal mood and affect. Her behavior is normal. Judgment and thought content normal.  B temples and cheeks are not tender to palpation Pt is usual herself now    Lab Results  Component Value Date   WBC 5.5 01/30/2015   HGB 13.4 01/30/2015   HCT 39.9 01/30/2015   PLT 204.0 01/30/2015   GLUCOSE 121* 01/30/2015   CHOL 159 03/01/2013   TRIG 205.0* 03/01/2013   HDL 44.30 03/01/2013   LDLDIRECT 91.5 03/01/2013   LDLCALC 92 11/06/2008   ALT 16 01/30/2015   AST 14 01/30/2015   NA 140 01/30/2015   K 4.4 01/30/2015   CL 102 01/30/2015   CREATININE 0.95 01/30/2015   BUN 21 01/30/2015   CO2 31 01/30/2015   TSH 3.51 01/30/2015   INR 1.1* 03/01/2013   HGBA1C 6.4 03/01/2013   MICROALBUR 1.7 11/06/2008    Dg Chest 2 View  03/09/2012   *RADIOLOGY REPORT*  Clinical Data: Cough, shortness of breath, chest pain and fever for 2 weeks.  CHEST - 2 VIEW  Comparison: 05/25/2009 and 07/19/2008.  Findings: The heart size and mediastinal contours are stable. There are streaky bibasilar opacities which appear chronic, although slightly  progressive.  There are probable new small bilateral pleural effusions.  No edema or confluent airspace opacity is seen.  The osseous structures appear unchanged.  IMPRESSION: Progressive chronic streaky bibasilar opacities suggesting scarring, possibly from underlying bronchiectasis.  Small bilateral pleural effusions.   Original Report Authenticated By: Vivia Ewing, M.D.    Assessment & Plan:   Diagnoses and all orders for this visit:  B12 deficiency  Essential hypertension  Trigeminal neuralgia of right side of face  Acute confusional state  Slow transit constipation  Other orders -     indomethacin (INDOCIN) 25 MG capsule; Take 1 capsule (25 mg total) by mouth 3 (three) times daily as needed. -     lubiprostone (AMITIZA) 24 MCG capsule; Take 1 capsule (24 mcg total) by mouth 2 (two) times daily with a meal.  I have discontinued Ms. Tischer's oxyCODONE. I have also changed her indomethacin. Additionally, I am having her start on lubiprostone. Lastly, I am having her maintain her vitamin B-12, cholecalciferol, losartan,  atenolol, clopidogrel, simvastatin, cloNIDine, QUEtiapine, ALPRAZolam, ergocalciferol, vitamin C, and OXcarbazepine.  Meds ordered this encounter  Medications  . indomethacin (INDOCIN) 25 MG capsule    Sig: Take 1 capsule (25 mg total) by mouth 3 (three) times daily as needed.    Dispense:  60 capsule    Refill:  1  . lubiprostone (AMITIZA) 24 MCG capsule    Sig: Take 1 capsule (24 mcg total) by mouth 2 (two) times daily with a meal.    Dispense:  60 capsule    Refill:  5   A complex case  Follow-up: No Follow-up on file.  Walker Kehr, MD

## 2015-02-16 ENCOUNTER — Telehealth: Payer: Self-pay | Admitting: Internal Medicine

## 2015-02-16 NOTE — Telephone Encounter (Signed)
Patient called in to advise that the   lubiprostone (AMITIZA) 24 MCG capsule   Given for her constipation has not helped at all after 2 days. Please give her a call.

## 2015-02-16 NOTE — Telephone Encounter (Signed)
Pt called in again, she states she is extremely Constipated and needs some relieve. It there anything else that can be called in to help her

## 2015-02-16 NOTE — Telephone Encounter (Signed)
Pt informed Add MOM, Cont Amitiza bid per pcp.

## 2015-03-01 ENCOUNTER — Other Ambulatory Visit: Payer: Self-pay | Admitting: Internal Medicine

## 2015-03-06 ENCOUNTER — Ambulatory Visit: Payer: Medicare Other | Admitting: Neurology

## 2015-03-14 ENCOUNTER — Other Ambulatory Visit: Payer: Self-pay | Admitting: Internal Medicine

## 2015-03-16 ENCOUNTER — Other Ambulatory Visit: Payer: Self-pay | Admitting: Internal Medicine

## 2015-03-16 NOTE — Telephone Encounter (Signed)
Called refills into [facility] spoke with allison/pharmacist gave md approval.../lmb

## 2015-03-19 ENCOUNTER — Encounter (HOSPITAL_COMMUNITY): Payer: Self-pay | Admitting: *Deleted

## 2015-03-19 ENCOUNTER — Inpatient Hospital Stay (HOSPITAL_COMMUNITY)
Admission: EM | Admit: 2015-03-19 | Discharge: 2015-03-24 | DRG: 684 | Disposition: A | Discharge status: Skilled Nursing Facility | Payer: Medicare Other | Attending: Internal Medicine | Admitting: Internal Medicine

## 2015-03-19 ENCOUNTER — Inpatient Hospital Stay (HOSPITAL_COMMUNITY): Payer: Medicare Other

## 2015-03-19 ENCOUNTER — Emergency Department (HOSPITAL_COMMUNITY): Payer: Medicare Other

## 2015-03-19 DIAGNOSIS — S0083XA Contusion of other part of head, initial encounter: Secondary | ICD-10-CM | POA: Diagnosis present

## 2015-03-19 DIAGNOSIS — K5900 Constipation, unspecified: Secondary | ICD-10-CM | POA: Diagnosis present

## 2015-03-19 DIAGNOSIS — Z79899 Other long term (current) drug therapy: Secondary | ICD-10-CM | POA: Diagnosis not present

## 2015-03-19 DIAGNOSIS — E538 Deficiency of other specified B group vitamins: Secondary | ICD-10-CM | POA: Diagnosis present

## 2015-03-19 DIAGNOSIS — Z79891 Long term (current) use of opiate analgesic: Secondary | ICD-10-CM

## 2015-03-19 DIAGNOSIS — Z881 Allergy status to other antibiotic agents status: Secondary | ICD-10-CM

## 2015-03-19 DIAGNOSIS — Z8249 Family history of ischemic heart disease and other diseases of the circulatory system: Secondary | ICD-10-CM | POA: Diagnosis not present

## 2015-03-19 DIAGNOSIS — E559 Vitamin D deficiency, unspecified: Secondary | ICD-10-CM | POA: Diagnosis present

## 2015-03-19 DIAGNOSIS — E041 Nontoxic single thyroid nodule: Secondary | ICD-10-CM | POA: Diagnosis present

## 2015-03-19 DIAGNOSIS — R270 Ataxia, unspecified: Secondary | ICD-10-CM | POA: Diagnosis present

## 2015-03-19 DIAGNOSIS — Z9181 History of falling: Secondary | ICD-10-CM | POA: Diagnosis not present

## 2015-03-19 DIAGNOSIS — Z880 Allergy status to penicillin: Secondary | ICD-10-CM

## 2015-03-19 DIAGNOSIS — Z886 Allergy status to analgesic agent status: Secondary | ICD-10-CM | POA: Diagnosis not present

## 2015-03-19 DIAGNOSIS — I10 Essential (primary) hypertension: Secondary | ICD-10-CM | POA: Diagnosis present

## 2015-03-19 DIAGNOSIS — E785 Hyperlipidemia, unspecified: Secondary | ICD-10-CM | POA: Diagnosis present

## 2015-03-19 DIAGNOSIS — H540 Blindness, both eyes: Secondary | ICD-10-CM | POA: Diagnosis present

## 2015-03-19 DIAGNOSIS — M810 Age-related osteoporosis without current pathological fracture: Secondary | ICD-10-CM | POA: Diagnosis present

## 2015-03-19 DIAGNOSIS — R112 Nausea with vomiting, unspecified: Secondary | ICD-10-CM | POA: Diagnosis present

## 2015-03-19 DIAGNOSIS — Z888 Allergy status to other drugs, medicaments and biological substances status: Secondary | ICD-10-CM | POA: Diagnosis not present

## 2015-03-19 DIAGNOSIS — Z86010 Personal history of colonic polyps: Secondary | ICD-10-CM | POA: Diagnosis not present

## 2015-03-19 DIAGNOSIS — E860 Dehydration: Secondary | ICD-10-CM | POA: Diagnosis not present

## 2015-03-19 DIAGNOSIS — R0602 Shortness of breath: Secondary | ICD-10-CM

## 2015-03-19 DIAGNOSIS — S40022A Contusion of left upper arm, initial encounter: Secondary | ICD-10-CM | POA: Diagnosis present

## 2015-03-19 DIAGNOSIS — W19XXXA Unspecified fall, initial encounter: Secondary | ICD-10-CM | POA: Diagnosis present

## 2015-03-19 DIAGNOSIS — S0993XA Unspecified injury of face, initial encounter: Secondary | ICD-10-CM | POA: Diagnosis not present

## 2015-03-19 DIAGNOSIS — I6781 Acute cerebrovascular insufficiency: Secondary | ICD-10-CM

## 2015-03-19 DIAGNOSIS — Z7902 Long term (current) use of antithrombotics/antiplatelets: Secondary | ICD-10-CM

## 2015-03-19 DIAGNOSIS — Y92009 Unspecified place in unspecified non-institutional (private) residence as the place of occurrence of the external cause: Secondary | ICD-10-CM

## 2015-03-19 DIAGNOSIS — F419 Anxiety disorder, unspecified: Secondary | ICD-10-CM | POA: Diagnosis present

## 2015-03-19 DIAGNOSIS — S40021A Contusion of right upper arm, initial encounter: Secondary | ICD-10-CM | POA: Diagnosis present

## 2015-03-19 DIAGNOSIS — E876 Hypokalemia: Secondary | ICD-10-CM | POA: Diagnosis present

## 2015-03-19 DIAGNOSIS — G629 Polyneuropathy, unspecified: Secondary | ICD-10-CM

## 2015-03-19 DIAGNOSIS — N179 Acute kidney failure, unspecified: Principal | ICD-10-CM | POA: Diagnosis present

## 2015-03-19 DIAGNOSIS — K2980 Duodenitis without bleeding: Secondary | ICD-10-CM | POA: Diagnosis present

## 2015-03-19 DIAGNOSIS — Z90710 Acquired absence of both cervix and uterus: Secondary | ICD-10-CM | POA: Diagnosis not present

## 2015-03-19 DIAGNOSIS — R197 Diarrhea, unspecified: Secondary | ICD-10-CM | POA: Diagnosis present

## 2015-03-19 DIAGNOSIS — Z833 Family history of diabetes mellitus: Secondary | ICD-10-CM

## 2015-03-19 DIAGNOSIS — R109 Unspecified abdominal pain: Secondary | ICD-10-CM

## 2015-03-19 DIAGNOSIS — M25519 Pain in unspecified shoulder: Secondary | ICD-10-CM

## 2015-03-19 DIAGNOSIS — S0993XD Unspecified injury of face, subsequent encounter: Secondary | ICD-10-CM | POA: Diagnosis not present

## 2015-03-19 LAB — CBC WITH DIFFERENTIAL/PLATELET
Basophils Absolute: 0 10*3/uL (ref 0.0–0.1)
Basophils Relative: 0 % (ref 0–1)
Eosinophils Absolute: 0 10*3/uL (ref 0.0–0.7)
Eosinophils Relative: 0 % (ref 0–5)
HCT: 40.7 % (ref 36.0–46.0)
Hemoglobin: 14.2 g/dL (ref 12.0–15.0)
Lymphocytes Relative: 8 % — ABNORMAL LOW (ref 12–46)
Lymphs Abs: 1.4 10*3/uL (ref 0.7–4.0)
MCH: 33.2 pg (ref 26.0–34.0)
MCHC: 34.9 g/dL (ref 30.0–36.0)
MCV: 95.1 fL (ref 78.0–100.0)
Monocytes Absolute: 1.7 10*3/uL — ABNORMAL HIGH (ref 0.1–1.0)
Monocytes Relative: 10 % (ref 3–12)
Neutro Abs: 13.6 10*3/uL — ABNORMAL HIGH (ref 1.7–7.7)
Neutrophils Relative %: 82 % — ABNORMAL HIGH (ref 43–77)
Platelets: 283 10*3/uL (ref 150–400)
RBC: 4.28 MIL/uL (ref 3.87–5.11)
RDW: 12.5 % (ref 11.5–15.5)
WBC: 16.7 10*3/uL — ABNORMAL HIGH (ref 4.0–10.5)

## 2015-03-19 LAB — BASIC METABOLIC PANEL
Anion gap: 16 — ABNORMAL HIGH (ref 5–15)
BUN: 49 mg/dL — ABNORMAL HIGH (ref 6–20)
CO2: 27 mmol/L (ref 22–32)
Calcium: 9.2 mg/dL (ref 8.9–10.3)
Chloride: 95 mmol/L — ABNORMAL LOW (ref 101–111)
Creatinine, Ser: 4.3 mg/dL — ABNORMAL HIGH (ref 0.44–1.00)
GFR calc Af Amer: 10 mL/min — ABNORMAL LOW (ref >60)
GFR calc non Af Amer: 9 mL/min — ABNORMAL LOW (ref >60)
Glucose, Bld: 170 mg/dL — ABNORMAL HIGH (ref 65–99)
Potassium: 3.7 mmol/L (ref 3.5–5.1)
Sodium: 138 mmol/L (ref 135–145)

## 2015-03-19 LAB — LACTIC ACID, PLASMA: Lactic Acid, Venous: 1.6 mmol/L (ref 0.5–2.0)

## 2015-03-19 LAB — URINALYSIS, ROUTINE W REFLEX MICROSCOPIC
Glucose, UA: NEGATIVE mg/dL
Ketones, ur: NEGATIVE mg/dL
Leukocytes, UA: NEGATIVE
Nitrite: NEGATIVE
Protein, ur: 30 mg/dL — AB
Specific Gravity, Urine: 1.02 (ref 1.005–1.030)
Urobilinogen, UA: 0.2 mg/dL (ref 0.0–1.0)
pH: 5 (ref 5.0–8.0)

## 2015-03-19 LAB — CK: Total CK: 127 U/L (ref 38–234)

## 2015-03-19 LAB — URINE MICROSCOPIC-ADD ON

## 2015-03-19 LAB — TROPONIN I

## 2015-03-19 MED ORDER — AMLODIPINE BESYLATE 5 MG PO TABS
5.0000 mg | ORAL_TABLET | Freq: Every day | ORAL | Status: DC
  Administered 2015-03-19 – 2015-03-20 (×2): 5 mg via ORAL
  Filled 2015-03-19 (×2): qty 1

## 2015-03-19 MED ORDER — SODIUM CHLORIDE 0.9 % IV SOLN
INTRAVENOUS | Status: DC
  Administered 2015-03-19: 18:00:00 via INTRAVENOUS

## 2015-03-19 MED ORDER — ACETAMINOPHEN 325 MG PO TABS: 650.0000 mg | ORAL_TABLET | Freq: Four times a day (QID) | ORAL | Status: DC | PRN

## 2015-03-19 MED ORDER — SODIUM CHLORIDE 0.9 % IV SOLN
INTRAVENOUS | Status: AC
Start: 2015-03-19 — End: 2015-03-20
  Administered 2015-03-20 (×2): via INTRAVENOUS

## 2015-03-19 MED ORDER — SODIUM CHLORIDE 0.9 % IV BOLUS (SEPSIS)
1000.0000 mL | Freq: Once | INTRAVENOUS | Status: AC
  Administered 2015-03-19: 1000 mL via INTRAVENOUS

## 2015-03-19 MED ORDER — PROMETHAZINE HCL 25 MG PO TABS
12.5000 mg | ORAL_TABLET | Freq: Four times a day (QID) | ORAL | Status: DC | PRN
  Administered 2015-03-21 – 2015-03-23 (×4): 12.5 mg via ORAL
  Filled 2015-03-19 (×4): qty 1

## 2015-03-19 MED ORDER — ACETAMINOPHEN 650 MG RE SUPP: 650.0000 mg | Freq: Four times a day (QID) | RECTAL | Status: DC | PRN

## 2015-03-19 MED ORDER — LORAZEPAM 1 MG PO TABS: 1.0000 mg | ORAL_TABLET | Freq: Two times a day (BID) | ORAL | Status: DC | PRN

## 2015-03-19 MED ORDER — ALBUTEROL SULFATE (2.5 MG/3ML) 0.083% IN NEBU: 2.5000 mg | INHALATION_SOLUTION | RESPIRATORY_TRACT | Status: DC | PRN

## 2015-03-19 MED ORDER — SODIUM CHLORIDE 0.9 % IJ SOLN
3.0000 mL | Freq: Two times a day (BID) | INTRAMUSCULAR | Status: DC
  Administered 2015-03-21 – 2015-03-22 (×2): 3 mL via INTRAVENOUS

## 2015-03-19 MED ORDER — HYDRALAZINE HCL 20 MG/ML IJ SOLN
5.0000 mg | Freq: Four times a day (QID) | INTRAMUSCULAR | Status: DC | PRN
  Administered 2015-03-19 – 2015-03-22 (×4): 5 mg via INTRAVENOUS
  Filled 2015-03-19 (×4): qty 1

## 2015-03-19 NOTE — ED Notes (Signed)
Patient lives at home alone and fell some time over the past 2 days. Patient's family had been trying to call with no answer. When they arrived from out of town the patient was in bed with the right side of her face badly swollen and bruised. Blood was found all over the bathroom. Patient had fallen gotten into bed and was asleep when her family arrived. There was vomit found in the patient's bed. Patient is alert and oriented at this time, but has some loss of memory about the fall.

## 2015-03-19 NOTE — ED Notes (Signed)
Patient will have shoulder XR completed prior to transporting to inpatient bed.

## 2015-03-19 NOTE — H&P (Signed)
History and Physical  Sandra [name] QBH:[id number] DOB: 1928/08/03 DOA: 03/19/2015  Referring physician: Dr. Lacretia Leigh, EDP PCP: Walker Kehr, MD  Outpatient Specialists:  1. None  Chief Complaint: Presumed fall, facial trauma, nausea, vomiting & diarrhea  HPI: Sandra [name] is a 81 y.o. 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 Patient is a poor historian. Data is gathered from her and her family at bedside. Family last spoke with patient on Friday night. Her daughter and son-in-law at bedside were out of town. Daughter tried to reach patient via phone yesterday without success. At known today when they went to look for her, she would not open the door. Using an alternate daily that they had, they were able to get in and found her lying in her bed with significant right facial trauma. There were dried blood in her bedroom and a small amount of fresh blood in the bathroom. It also looked like she had some diarrhea. Patient states that she had 3 episodes of vomiting today. Patient knows that she fell yesterday but cannot recollect the circumstances surrounding the fall. She cannot say if she had any dizziness, lightheadedness, chest pain, palpitations or if she lost consciousness. Family also noticed some blood drops in the kitchen. She currently denies any complaints but seems to be slightly confused. As per family, she has sustained prior falls earlier this year and is unsteady on her gait. In the ED, evaluation revealed mildly elevated blood pressures, low-grade temperature, creatinine 4.3, WBC 16.7, negative troponin and CK 1 and extensive imaging with chest x-ray, CT head, CT cervical spine, x-ray left shoulder, CT maxillofacial only significant for mild anterior  subluxation of right TMJ. Hospitalist admission was requested.   Review of Systems: All systems reviewed and apart from history of presenting illness, are negative.  Past Medical History  Diagnosis Date  . Colon polyps   . Hyperlipidemia   . HTN (hypertension)   . Osteoporosis   . Anxiety   . Vitamin D deficiency   . Strabismus     L eye  . LBP (low back pain) 2011    right  . Peripheral neuropathy   . Vitamin B12 deficiency    Past Surgical History  Procedure Laterality Date  . Abdominal hysterectomy     Social History:  reports that she has never smoked. She does not have any smokeless tobacco history on file. She reports that she does not drink alcohol or use illicit drugs.   Allergies  Allergen Reactions  . Prednisone Shortness Of Breath and Other (See Comments)    dyspnea  . Aspirin     Upset stomach  . Azithromycin     REACTION: does not work She can take Biaxin  . Enalapril Maleate     REACTION: cough  . Penicillins     Per Family there was some swelling of throat, reaction occurred more than 10 years ago :Did PCNreaction causing immediate rash, facial/tongue/throat swelling, SOB or lightheadedness with hypotension: Yes: Did PCN reaction causing severe rash involving mucus membranes or skin necrosis:  Did a PCN reaction that required hospitalization  Did a PCN reaction occurring within the last 10 years: No..Marland KitchenMarland KitchenIf all of the above answers are "NO", then may proceed with Cephalosporin  . Trazodone And  Nefazodone     Peeing a lot    Family History  Problem Relation Age of Onset  . Hypertension    . Heart disease Mother 35    chf  . Early death Father   . Heart disease Father 30    mi  . Diabetes Sister   . Heart disease Sister     chf    Prior to Admission medications   Medication Sig Start Date End Date Taking? Authorizing Provider  ALPRAZolam (XANAX) 0.5 MG tablet TAKE 1 TO 2 TABLETS BY MOUTH 3 TIMES A DAY AS NEEDED FOR ANXIETY 01/26/15  Yes Aleksei  Plotnikov V, MD  atenolol (TENORMIN) 100 MG tablet Take 1 tablet (100 mg total) by mouth 2 (two) times daily. 06/19/14  Yes Aleksei Plotnikov V, MD  carbamazepine (TEGRETOL) 100 MG chewable tablet Chew 100 mg by mouth 2 (two) times daily.   Yes Historical Provider, MD  Cholecalciferol (VITAMIN D3) 1000 UNITS tablet Take 1,000 Units by mouth daily.     Yes Historical Provider, MD  cloNIDine (CATAPRES) 0.1 MG tablet Take 1 tablet (0.1 mg total) by mouth 2 (two) times daily. For sweating and for the blood pressure. 06/19/14  Yes Aleksei Plotnikov V, MD  clopidogrel (PLAVIX) 75 MG tablet Take 1 tablet (75 mg total) by mouth daily. 06/19/14 06/19/15 Yes Aleksei Plotnikov V, MD  diphenoxylate-atropine (LOMOTIL) 2.5-0.025 MG per tablet Take 1 tablet by mouth 4 (four) times daily as needed for diarrhea or loose stools.   Yes Historical Provider, MD  gabapentin (NEURONTIN) 100 MG capsule TAKE 1 CAPSULE (100 MG TOTAL) BY MOUTH 3 (THREE) TIMES DAILY AS NEEDED. 03/16/15  Yes Aleksei Plotnikov V, MD  HYDROcodone-acetaminophen (NORCO/VICODIN) 5-325 MG per tablet Take 1 tablet by mouth 2 (two) times daily as needed for moderate pain.   Yes Historical Provider, MD  LORazepam (ATIVAN) 1 MG tablet TAKE 1-2 TABLET BY MOUTH TWICE DAILY AS NEEDED FOR ANXIETY 03/16/15  Yes Aleksei Plotnikov V, MD  losartan (COZAAR) 100 MG tablet Take 1 tablet (100 mg total) by mouth daily. 12/29/11 03/19/15 Yes Aleksei Plotnikov V, MD  QUEtiapine (SEROQUEL) 100 MG tablet TAKE 1 OR 2 TABLETS BY MOUTH AT BEDTIME FOR INSOMNIA 03/06/15  Yes Aleksei Plotnikov V, MD  simvastatin (ZOCOR) 40 MG tablet Take 1 tablet (40 mg total) by mouth at bedtime. 06/19/14  Yes Aleksei Plotnikov V, MD  temazepam (RESTORIL) 30 MG capsule Take 60 mg by mouth at bedtime as needed for sleep.   Yes Historical Provider, MD  traMADol (ULTRAM) 50 MG tablet TAKE 1 OR 2 TABLETS BY MOUTH TWICE A DAY AS NEEDED FOR PAIN 03/16/15  Yes Aleksei Plotnikov V, MD  triamcinolone ointment  (KENALOG) 0.5 % Apply 1 application topically 2 (two) times daily.   Yes Historical Provider, MD  vitamin B-12 (CYANOCOBALAMIN) 1000 MCG tablet Take 1,000 mcg by mouth daily.     Yes Historical Provider, MD  Ascorbic Acid (VITAMIN C) 100 MG tablet Take 100 mg by mouth daily.    Historical Provider, MD  ergocalciferol (VITAMIN D2) 50000 UNITS capsule Take 1 capsule (50,000 Units total) by mouth once a week. 01/31/15   Aleksei Plotnikov V, MD  indomethacin (INDOCIN) 25 MG capsule Take 1 capsule (25 mg total) by mouth 3 (three) times daily as needed. 02/14/15   Aleksei Plotnikov V, MD  lubiprostone (AMITIZA) 24 MCG capsule Take 1 capsule (24 mcg total) by mouth 2 (two) times daily with a meal. 02/14/15   Aleksei Plotnikov  V, MD   Physical Exam: Filed Vitals:   03/19/15 1443 03/19/15 1545 03/19/15 1730 03/19/15 1747  BP: 176/77 180/79 184/73 170/60  Pulse: 63 63  66  Temp: 99.1 F (37.3 C) 99.1 F (37.3 C)    TempSrc: Oral Rectal    Resp: 18 13 17 12   SpO2: 91% 94%  95%          General exam: Moderately built and nourished pleasant elderly female patient, lying comfortably propped up on the gurney in no obvious distress.  Head, eyes and ENT: Extensive bruising of right side of the face and especially in the periorbital area where the right eye is partially shut, mild right temporal subconjunctival hemorrhage but right pupil round 3 mm and reacting to light. Right eye visual acuity intact. Right eye extraocular muscle movements intact. Right inferior orbital hematoma. Left eye blind and has pterygium.  Neck: Supple. No JVD, carotid bruit or thyromegaly.  Lymphatics: No lymphadenopathy.  Respiratory system: Clear to auscultation. No increased work of breathing.  Cardiovascular system: S1 and S2 heard, RRR. No JVD, murmurs, gallops, clicks or pedal edema.  Gastrointestinal system: Abdomen is nondistended, soft and nontender. Normal bowel sounds heard. No organomegaly or masses  appreciated. Mild superficial bruising over right lower quadrant skin.  Central nervous system: Alert and oriented to person and place. No focal neurological deficits.  Extremities: Symmetric 5 x 5 power. Peripheral pulses symmetrically felt.   Skin: Multiple bruises over the body including over right shoulder, right elbow, right scapular region, both knees, left iliac crest and a fairly large skin tear in the right popliteal fossa. No active bleeding anywhere.  Musculoskeletal system: Negative exam.  Psychiatry: Pleasant and cooperative.   Labs on Admission:  Basic Metabolic Panel:  Recent Labs Lab 03/19/15 1523  NA 138  K 3.7  CL 95*  CO2 27  GLUCOSE 170*  BUN 49*  CREATININE 4.30*  CALCIUM 9.2   Liver Function Tests: No results for input(s): AST, ALT, ALKPHOS, BILITOT, PROT, ALBUMIN in the last 168 hours. No results for input(s): LIPASE, AMYLASE in the last 168 hours. No results for input(s): AMMONIA in the last 168 hours. CBC:  Recent Labs Lab 03/19/15 1523  WBC 16.7*  NEUTROABS 13.6*  HGB 14.2  HCT 40.7  MCV 95.1  PLT 283   Cardiac Enzymes:  Recent Labs Lab 03/19/15 1523  CKTOTAL 127  TROPONINI <0.03    BNP (last 3 results) No results for input(s): PROBNP in the last 8760 hours. CBG: No results for input(s): GLUCAP in the last 168 hours.  Radiological Exams on Admission: Dg Chest 2 View  03/19/2015   CLINICAL DATA:  Fall sometime in the last 2 days. Left shoulder pain. Patient found with swelling of the face. Vomiting.  EXAM: CHEST  2 VIEW  COMPARISON:  03/09/2012  FINDINGS: Atherosclerotic aortic arch. Heart size within normal limits. Stable prominence of the epicardial adipose tissue. No pneumothorax or pleural effusion.  Mild thoracic spondylosis.  IMPRESSION: 1. No acute thoracic findings. 2. Atherosclerotic aortic arch.   Electronically Signed   By: Van Clines M.D.   On: 03/19/2015 16:30   Ct Head Wo Contrast  03/19/2015   CLINICAL  DATA:  Patient lives at home alone and fell some time over the past 2 days. Patient's family had been trying to call with no answer. When they arrived from out of town the patient was in bed with the right side of her face badly swollen and bruised. Blood  was found all over the bathroom. Patient had fallen gotten into bed and was asleep when her family arrived. There was vomit found in the patient's bed. Patient is alert and oriented at this time, but has some loss of memory about the fall. Pt has hypertension.  EXAM: CT HEAD WITHOUT CONTRAST  CT MAXILLOFACIAL WITHOUT CONTRAST  CT CERVICAL SPINE WITHOUT CONTRAST  TECHNIQUE: Multidetector CT imaging of the head, cervical spine, and maxillofacial structures were performed using the standard protocol without intravenous contrast. Multiplanar CT image reconstructions of the cervical spine and maxillofacial structures were also generated.  COMPARISON:  MRI and 02/08/2015  FINDINGS: CT HEAD FINDINGS  There is central and cortical atrophy. Periventricular white matter changes are consistent with small vessel disease. There is no intra or extra-axial fluid collection or mass lesion. The basilar cisterns and ventricles have a normal appearance. There is no CT evidence for acute infarction or hemorrhage. Bone windows show soft tissue edema superficial to the right zygomatic arch and right orbit. No calvarial fracture identified.  CT MAXILLOFACIAL FINDINGS  There is moderate preseptal edema of the right orbit. The right globe is intact. There is significant soft tissue swelling superficial to the right zygomatic arch. A hematoma is seen superficial to the lateral wall of the maxilla which measures 3.4 x 1.6 cm. Soft tissue swelling extends inferiorly to level of the mandible. There is mild anterior subluxation of the temporomandibular joint without frank subluxation or fracture.  There is chronic asymmetry of the left globe without evidence for acute injury.  The nasal bones,  bony nasal septum, zygomatic arches, mandible and pterygoid plates are intact. No evidence for acute fracture of the sinus walls or orbits.  CT CERVICAL SPINE FINDINGS  There is significant degenerative disc disease of the cervical spine, with moderate narrowing and posterior osteophyte formation at C3-4, C5-6. There is no acute fracture or subluxation. Note is made of atherosclerotic calcification of the carotid arteries bilaterally. Incidental note is made of a right thyroid nodule, measuring 1.4 cm.  IMPRESSION: 1. Atrophy and small vessel disease. 2.  No evidence for acute intracranial abnormality. 3. Significant soft tissue swelling involving the right side of the face extending from the right orbit inferiorly to the right aspect of the mandible. There is a moderate hematoma along the lateral wall of the right maxilla which measures 3.4 cm. Soft tissue swelling involves the preseptal soft tissues of the right orbit. The globe is intact. 4. Mild anterior subluxation of the right temporomandibular joint. No dislocation or fracture. 5. Chronic asymmetry of the left globe without acute injury. 6. Degenerative changes in the cervical spine. No evidence for acute cervical spine abnormality. 7. Incidental note of right thyroid nodule with benign features. No further evaluation is felt to be necessary based on consensus criteria. 8. Carotid calcifications.   Electronically Signed   By: Nolon Nations M.D.   On: 03/19/2015 16:36   Ct Cervical Spine Wo Contrast  03/19/2015   CLINICAL DATA:  Patient lives at home alone and fell some time over the past 2 days. Patient's family had been trying to call with no answer. When they arrived from out of town the patient was in bed with the right side of her face badly swollen and bruised. Blood was found all over the bathroom. Patient had fallen gotten into bed and was asleep when her family arrived. There was vomit found in the patient's bed. Patient is alert and oriented at  this time, but has some  loss of memory about the fall. Pt has hypertension.  EXAM: CT HEAD WITHOUT CONTRAST  CT MAXILLOFACIAL WITHOUT CONTRAST  CT CERVICAL SPINE WITHOUT CONTRAST  TECHNIQUE: Multidetector CT imaging of the head, cervical spine, and maxillofacial structures were performed using the standard protocol without intravenous contrast. Multiplanar CT image reconstructions of the cervical spine and maxillofacial structures were also generated.  COMPARISON:  MRI and 02/08/2015  FINDINGS: CT HEAD FINDINGS  There is central and cortical atrophy. Periventricular white matter changes are consistent with small vessel disease. There is no intra or extra-axial fluid collection or mass lesion. The basilar cisterns and ventricles have a normal appearance. There is no CT evidence for acute infarction or hemorrhage. Bone windows show soft tissue edema superficial to the right zygomatic arch and right orbit. No calvarial fracture identified.  CT MAXILLOFACIAL FINDINGS  There is moderate preseptal edema of the right orbit. The right globe is intact. There is significant soft tissue swelling superficial to the right zygomatic arch. A hematoma is seen superficial to the lateral wall of the maxilla which measures 3.4 x 1.6 cm. Soft tissue swelling extends inferiorly to level of the mandible. There is mild anterior subluxation of the temporomandibular joint without frank subluxation or fracture.  There is chronic asymmetry of the left globe without evidence for acute injury.  The nasal bones, bony nasal septum, zygomatic arches, mandible and pterygoid plates are intact. No evidence for acute fracture of the sinus walls or orbits.  CT CERVICAL SPINE FINDINGS  There is significant degenerative disc disease of the cervical spine, with moderate narrowing and posterior osteophyte formation at C3-4, C5-6. There is no acute fracture or subluxation. Note is made of atherosclerotic calcification of the carotid arteries bilaterally.  Incidental note is made of a right thyroid nodule, measuring 1.4 cm.  IMPRESSION: 1. Atrophy and small vessel disease. 2.  No evidence for acute intracranial abnormality. 3. Significant soft tissue swelling involving the right side of the face extending from the right orbit inferiorly to the right aspect of the mandible. There is a moderate hematoma along the lateral wall of the right maxilla which measures 3.4 cm. Soft tissue swelling involves the preseptal soft tissues of the right orbit. The globe is intact. 4. Mild anterior subluxation of the right temporomandibular joint. No dislocation or fracture. 5. Chronic asymmetry of the left globe without acute injury. 6. Degenerative changes in the cervical spine. No evidence for acute cervical spine abnormality. 7. Incidental note of right thyroid nodule with benign features. No further evaluation is felt to be necessary based on consensus criteria. 8. Carotid calcifications.   Electronically Signed   By: Nolon Nations M.D.   On: 03/19/2015 16:36   Dg Shoulder Left  03/19/2015   CLINICAL DATA:  Fall recently with left shoulder pain. Initial encounter.  EXAM: LEFT SHOULDER - 2+ VIEW  COMPARISON:  None.  FINDINGS: No acute fracture or dislocation is identified. There are moderate degenerative changes involving the glenohumeral joint and AC joint. Cystic changes are seen in the lateral humeral head. No bony destruction identified. Soft tissues are unremarkable.  IMPRESSION: No acute fracture. Moderate degenerative changes present involving the glenohumeral joint and AC joint.   Electronically Signed   By: Aletta Edouard M.D.   On: 03/19/2015 16:34   Ct Maxillofacial Wo Cm  03/19/2015   CLINICAL DATA:  Patient lives at home alone and fell some time over the past 2 days. Patient's family had been trying to call with no answer. When  they arrived from out of town the patient was in bed with the right side of her face badly swollen and bruised. Blood was found all  over the bathroom. Patient had fallen gotten into bed and was asleep when her family arrived. There was vomit found in the patient's bed. Patient is alert and oriented at this time, but has some loss of memory about the fall. Pt has hypertension.  EXAM: CT HEAD WITHOUT CONTRAST  CT MAXILLOFACIAL WITHOUT CONTRAST  CT CERVICAL SPINE WITHOUT CONTRAST  TECHNIQUE: Multidetector CT imaging of the head, cervical spine, and maxillofacial structures were performed using the standard protocol without intravenous contrast. Multiplanar CT image reconstructions of the cervical spine and maxillofacial structures were also generated.  COMPARISON:  MRI and 02/08/2015  FINDINGS: CT HEAD FINDINGS  There is central and cortical atrophy. Periventricular white matter changes are consistent with small vessel disease. There is no intra or extra-axial fluid collection or mass lesion. The basilar cisterns and ventricles have a normal appearance. There is no CT evidence for acute infarction or hemorrhage. Bone windows show soft tissue edema superficial to the right zygomatic arch and right orbit. No calvarial fracture identified.  CT MAXILLOFACIAL FINDINGS  There is moderate preseptal edema of the right orbit. The right globe is intact. There is significant soft tissue swelling superficial to the right zygomatic arch. A hematoma is seen superficial to the lateral wall of the maxilla which measures 3.4 x 1.6 cm. Soft tissue swelling extends inferiorly to level of the mandible. There is mild anterior subluxation of the temporomandibular joint without frank subluxation or fracture.  There is chronic asymmetry of the left globe without evidence for acute injury.  The nasal bones, bony nasal septum, zygomatic arches, mandible and pterygoid plates are intact. No evidence for acute fracture of the sinus walls or orbits.  CT CERVICAL SPINE FINDINGS  There is significant degenerative disc disease of the cervical spine, with moderate narrowing and  posterior osteophyte formation at C3-4, C5-6. There is no acute fracture or subluxation. Note is made of atherosclerotic calcification of the carotid arteries bilaterally. Incidental note is made of a right thyroid nodule, measuring 1.4 cm.  IMPRESSION: 1. Atrophy and small vessel disease. 2.  No evidence for acute intracranial abnormality. 3. Significant soft tissue swelling involving the right side of the face extending from the right orbit inferiorly to the right aspect of the mandible. There is a moderate hematoma along the lateral wall of the right maxilla which measures 3.4 cm. Soft tissue swelling involves the preseptal soft tissues of the right orbit. The globe is intact. 4. Mild anterior subluxation of the right temporomandibular joint. No dislocation or fracture. 5. Chronic asymmetry of the left globe without acute injury. 6. Degenerative changes in the cervical spine. No evidence for acute cervical spine abnormality. 7. Incidental note of right thyroid nodule with benign features. No further evaluation is felt to be necessary based on consensus criteria. 8. Carotid calcifications.   Electronically Signed   By: Nolon Nations M.D.   On: 03/19/2015 16:36    EKG: Independently reviewed.Baseline artifacts, sinus rhythm, normal axis and no acute changes. QTC 461 ms.  Assessment/Plan Principal Problem:   AKI (acute kidney injury) Active Problems:   Dyslipidemia   Essential hypertension   Ataxia   Peripheral neuropathy   Fall at home   Facial trauma-right side   Nausea vomiting and diarrhea   Dehydration   Acute kidney injury   Acute kidney injury - Creatinine normal at  0.95 on 01/30/15. Admitted with creatinine of 4.3 - Multifactorial secondary to dehydration from GI losses, poor oral intake and meds (ARB +/- NSAIDs) - Hold culprit medications. Check urine sodium and creatinine to calculate Fena - IV normal saline hydration, strict intake and output charting and follow daily BMP. - If  renal functions do not improve, then consider CT abdomen.  Fall and right facial trauma and other superficial trauma/ataxia/peripheral neuropathy - History of gait ataxia and falls at home - No significant fractures or dislocation on imaging studies - We will get right shoulder x-ray to rule out fractures or dislocation - Symptomatic treatment - PT and OT evaluation. May need SNF at discharge - Her falls may be multifactorial related to decreased proprioception (blind left eye, peripheral neuropathy), advanced age and weakness  Nausea, vomiting and diarrhea - DD acute viral GE versus rule out C. Difficile - Check C. difficile PCR - Supportive treatment for now and hold off on antibiotics  Dehydration - IV fluids  Essential hypertension - Patient on several medications at home and it is not entirely clear which one of those she actually takes. Listed are atenolol, ARB and clonidine. - For now will start amlodipine 5 MG daily and when necessary IV hydralazine - We will need to check with family or patient later regarding her home medication regimen.  HLD - Possibly on statins at home.  Mild anterior subluxation of right TMJ - Asymptomatic at this time. Monitor  Right thyroid nodule - Seen on CT and reported with benign features and radiologist recommends no further evaluation based on consensus criteria    DVT prophylaxis: SCDs  Code Status: Full  Family Communication:Discussed with daughter and son-in-law at bedside. Disposition Plan: Possibly SNF when medically stable, may need 3-4 days in the hospital.    Time spent: 61 minutes  Evamae Rowen, MD, FACP, FHM. [facility] Pager [phone]  If 7PM-7AM, please contact night-coverage [email or web address] Password [facility] 03/19/2015, 6:27 PM

## 2015-03-19 NOTE — ED Notes (Signed)
Consulting MD at bedside

## 2015-03-19 NOTE — ED Notes (Signed)
Patient pulled out IV in left hand.  Pressure dressing placed.

## 2015-03-19 NOTE — ED Provider Notes (Signed)
CSN: [id number]     Arrival date & time 03/19/15  1431 History   First MD Initiated Contact with Patient 03/19/15 1506     Chief Complaint  Patient presents with  . Fall     (Consider location/radiation/quality/duration/timing/severity/associated sxs/prior Treatment) HPI Comments: Pt here after syncopal event yesterday--no prodromal sx--denied chest pain, sob, palpitations Has had diarrhea and nausea/vomiting--unsure if that started after she passed out No fever Now notes pain to right face and bilateral ribs w/o dyspnea Denies neck pain, no urinary sx Pt eventually able to walk to her bed and found by her family this am No recent changes to meds, no tx used pta  Patient is a 81 y.o. female presenting with fall. The history is provided by the patient and a relative.  Fall This is a new problem. The current episode started yesterday. The problem occurs rarely. The problem has not changed since onset.Nothing aggravates the symptoms. Nothing relieves the symptoms. She has tried nothing for the symptoms.    Past Medical History  Diagnosis Date  . Colon polyps   . Hyperlipidemia   . HTN (hypertension)   . Osteoporosis   . Anxiety   . Vitamin D deficiency   . Strabismus     L eye  . LBP (low back pain) 2011    right  . Peripheral neuropathy   . Vitamin B12 deficiency    Past Surgical History  Procedure Laterality Date  . Abdominal hysterectomy     Family History  Problem Relation Age of Onset  . Hypertension    . Heart disease Mother 79    chf  . Early death Father   . Heart disease Father 53    mi  . Diabetes Sister   . Heart disease Sister     chf   Social History  Substance Use Topics  . Smoking status: Never Smoker   . Smokeless tobacco: None  . Alcohol Use: No   OB History    No data available     Review of Systems  All other systems reviewed and are negative.     Allergies  Prednisone; Aspirin; Azithromycin; Enalapril maleate; Penicillins; and  Trazodone and nefazodone  Home Medications   Prior to Admission medications   Medication Sig Start Date End Date Taking? Authorizing Provider  ALPRAZolam (XANAX) 0.5 MG tablet TAKE 1 TO 2 TABLETS BY MOUTH 3 TIMES A DAY AS NEEDED FOR ANXIETY 01/26/15  Yes Aleksei Plotnikov V, MD  atenolol (TENORMIN) 100 MG tablet Take 1 tablet (100 mg total) by mouth 2 (two) times daily. 06/19/14  Yes Aleksei Plotnikov V, MD  carbamazepine (TEGRETOL) 100 MG chewable tablet Chew 100 mg by mouth 2 (two) times daily.   Yes Historical Provider, MD  Cholecalciferol (VITAMIN D3) 1000 UNITS tablet Take 1,000 Units by mouth daily.     Yes Historical Provider, MD  cloNIDine (CATAPRES) 0.1 MG tablet Take 1 tablet (0.1 mg total) by mouth 2 (two) times daily. For sweating and for the blood pressure. 06/19/14  Yes Aleksei Plotnikov V, MD  clopidogrel (PLAVIX) 75 MG tablet Take 1 tablet (75 mg total) by mouth daily. 06/19/14 06/19/15 Yes Aleksei Plotnikov V, MD  diphenoxylate-atropine (LOMOTIL) 2.5-0.025 MG per tablet Take 1 tablet by mouth 4 (four) times daily as needed for diarrhea or loose stools.   Yes Historical Provider, MD  gabapentin (NEURONTIN) 100 MG capsule TAKE 1 CAPSULE (100 MG TOTAL) BY MOUTH 3 (THREE) TIMES DAILY AS NEEDED. 03/16/15  Yes Tyrone Apple  Plotnikov V, MD  HYDROcodone-acetaminophen (NORCO/VICODIN) 5-325 MG per tablet Take 1 tablet by mouth 2 (two) times daily as needed for moderate pain.   Yes Historical Provider, MD  LORazepam (ATIVAN) 1 MG tablet TAKE 1-2 TABLET BY MOUTH TWICE DAILY AS NEEDED FOR ANXIETY 03/16/15  Yes Aleksei Plotnikov V, MD  losartan (COZAAR) 100 MG tablet Take 1 tablet (100 mg total) by mouth daily. 12/29/11 03/19/15 Yes Aleksei Plotnikov V, MD  QUEtiapine (SEROQUEL) 100 MG tablet TAKE 1 OR 2 TABLETS BY MOUTH AT BEDTIME FOR INSOMNIA 03/06/15  Yes Aleksei Plotnikov V, MD  simvastatin (ZOCOR) 40 MG tablet Take 1 tablet (40 mg total) by mouth at bedtime. 06/19/14  Yes Aleksei Plotnikov V, MD    temazepam (RESTORIL) 30 MG capsule Take 60 mg by mouth at bedtime as needed for sleep.   Yes Historical Provider, MD  traMADol (ULTRAM) 50 MG tablet TAKE 1 OR 2 TABLETS BY MOUTH TWICE A DAY AS NEEDED FOR PAIN 03/16/15  Yes Aleksei Plotnikov V, MD  triamcinolone ointment (KENALOG) 0.5 % Apply 1 application topically 2 (two) times daily.   Yes Historical Provider, MD  vitamin B-12 (CYANOCOBALAMIN) 1000 MCG tablet Take 1,000 mcg by mouth daily.     Yes Historical Provider, MD  Ascorbic Acid (VITAMIN C) 100 MG tablet Take 100 mg by mouth daily.    Historical Provider, MD  ergocalciferol (VITAMIN D2) 50000 UNITS capsule Take 1 capsule (50,000 Units total) by mouth once a week. 01/31/15   Aleksei Plotnikov V, MD  indomethacin (INDOCIN) 25 MG capsule Take 1 capsule (25 mg total) by mouth 3 (three) times daily as needed. 02/14/15   Aleksei Plotnikov V, MD  lubiprostone (AMITIZA) 24 MCG capsule Take 1 capsule (24 mcg total) by mouth 2 (two) times daily with a meal. 02/14/15   Aleksei Plotnikov V, MD  OXcarbazepine (TRILEPTAL) 150 MG tablet Take 1 tablet twice a day for 1 week, then increase to 2 tablets twice a day Patient not taking: Reported on 02/14/2015 02/02/15   Cameron Sprang, MD  QUEtiapine (SEROQUEL) 100 MG tablet TAKE 1 OR 2 TABLETS BY MOUTH AT BEDTIME FOR INSOMNIA Patient not taking: Reported on 03/19/2015 03/16/15   Aleksei Plotnikov V, MD   BP 176/77 mmHg  Pulse 63  Temp(Src) 99.1 F (37.3 C) (Oral)  Resp 18  SpO2 91% Physical Exam  Constitutional: She is oriented to person, place, and time. She appears well-developed and well-nourished.  Non-toxic appearance. No distress.  HENT:  Head: Normocephalic and atraumatic.    Eyes: Conjunctivae, EOM and lids are normal. Pupils are equal, round, and reactive to light.  Neck: Normal range of motion. Neck supple. No spinous process tenderness and no muscular tenderness present. No tracheal deviation present. No thyroid mass present.  Cardiovascular:  Normal rate, regular rhythm and normal heart sounds.  Exam reveals no gallop.   No murmur heard. Pulmonary/Chest: Effort normal and breath sounds normal. No stridor. No respiratory distress. She has no decreased breath sounds. She has no wheezes. She has no rhonchi. She has no rales. She exhibits no crepitus and no retraction.  Abdominal: Soft. Normal appearance and bowel sounds are normal. She exhibits no distension. There is no tenderness. There is no rebound and no CVA tenderness.    Musculoskeletal: Normal range of motion. She exhibits no edema or tenderness.       Arms: Neurological: She is alert and oriented to person, place, and time. She has normal strength. No cranial nerve deficit or  sensory deficit. GCS eye subscore is 4. GCS verbal subscore is 5. GCS motor subscore is 6.  Skin: Skin is warm and dry. No abrasion and no rash noted.  Psychiatric: She has a normal mood and affect. Her speech is normal and behavior is normal.  Nursing note and vitals reviewed.   ED Course  Procedures (including critical care time) Labs Review Labs Reviewed  URINE CULTURE  URINALYSIS, ROUTINE W REFLEX MICROSCOPIC (NOT AT [facility])  CBC WITH DIFFERENTIAL/PLATELET  BASIC METABOLIC PANEL  CK  LACTIC ACID, PLASMA  TROPONIN I    Imaging Review No results found. I have personally reviewed and evaluated these images and lab results as part of my medical decision-making.   EKG Interpretation   Date/Time:  Monday March 19 2015 15:08:33 EDT Ventricular Rate:  66 PR Interval:  129 QRS Duration: 84 QT Interval:  440 QTC Calculation: 461 R Axis:   57 Text Interpretation:  Sinus rhythm Nonspecific repol abnormality, diffuse  leads Artifact in lead(s) I III aVR aVL Confirmed by Retter  MD, Lanay Zinda  ([id number]) on 03/19/2015 4:23:55 PM      MDM   Final diagnoses:  SOB (shortness of breath)   patient given IV fluids here. She has evidence of acute kidney injury. No evidence of acute facial  fractures. Will be admitted for further workup    Lacretia Leigh, MD 03/19/15 1754

## 2015-03-19 NOTE — ED Provider Notes (Signed)
MSE was initiated and I personally evaluated the patient and placed orders (if any) at  2:52 PM on March 19, 2015.  The patient appears stable so that the remainder of the MSE may be completed by another provider.  85yF with presumed fall at home. Not sure when last seen normal. Family out of town and went to check on her today after she was not answering the phone. Apparently found laying in bed with obvious facial trauma and vomit on her. Blood found in bathroom. Pt able to tell me she fell but not specifics beyond this. Family not at bedside currently.   Virgel Manifold, MD 03/19/15 [phone]

## 2015-03-19 NOTE — ED Notes (Signed)
Spoke with Janett Billow, Agricultural consultant. Patient can be transported @ 1845.

## 2015-03-19 NOTE — ED Notes (Signed)
Bed: GI71 Expected date:  Expected time:  Means of arrival:  Comments: EMS 15 FALL

## 2015-03-19 NOTE — ED Notes (Signed)
Patient transported to CT/XR ?

## 2015-03-19 NOTE — Progress Notes (Signed)
EDCM spoke to patient and her family at bedside.  Patient presents to ED post fall at home. Patient's daughter [name] [phone].  Patient lives at home alone.  Patient reports she has a walker, cane and shower bench at home.  Patient reports she is able to perform her ADL's without difficulty.  Patient does not have home health services at this time and never has.  Patient's daughter reports patient's pcp is Dr. Alain Marion with [facility].  Admitting doctor appeared at bedside.  EDCM provided patient with list of home health agencies in [facility].  Informed patient and family if patient required any further equipment or home health services it can be arranged.  Patient and family thankful for services.  No further EDCM needs at this time.

## 2015-03-19 NOTE — ED Notes (Signed)
XR at bedside

## 2015-03-20 ENCOUNTER — Inpatient Hospital Stay (HOSPITAL_COMMUNITY): Payer: Medicare Other

## 2015-03-20 ENCOUNTER — Other Ambulatory Visit: Payer: Self-pay | Admitting: General Practice

## 2015-03-20 DIAGNOSIS — S0993XD Unspecified injury of face, subsequent encounter: Secondary | ICD-10-CM

## 2015-03-20 DIAGNOSIS — I10 Essential (primary) hypertension: Secondary | ICD-10-CM

## 2015-03-20 LAB — URINE CULTURE: Culture: NO GROWTH

## 2015-03-20 LAB — CBC
HEMATOCRIT: 38.7 % (ref 36.0–46.0)
HEMOGLOBIN: 13.1 g/dL (ref 12.0–15.0)
MCH: 32.8 pg (ref 26.0–34.0)
MCHC: 33.9 g/dL (ref 30.0–36.0)
MCV: 96.8 fL (ref 78.0–100.0)
Platelets: 265 10*3/uL (ref 150–400)
RBC: 4 MIL/uL (ref 3.87–5.11)
RDW: 12.6 % (ref 11.5–15.5)
WBC: 15 10*3/uL — ABNORMAL HIGH (ref 4.0–10.5)

## 2015-03-20 LAB — COMPREHENSIVE METABOLIC PANEL
ALBUMIN: 3.5 g/dL (ref 3.5–5.0)
ALT: 13 U/L — ABNORMAL LOW (ref 14–54)
ANION GAP: 11 (ref 5–15)
AST: 22 U/L (ref 15–41)
Alkaline Phosphatase: 46 U/L (ref 38–126)
BILIRUBIN TOTAL: 0.5 mg/dL (ref 0.3–1.2)
BUN: 55 mg/dL — ABNORMAL HIGH (ref 6–20)
CHLORIDE: 105 mmol/L (ref 101–111)
CO2: 21 mmol/L — ABNORMAL LOW (ref 22–32)
Calcium: 8.2 mg/dL — ABNORMAL LOW (ref 8.9–10.3)
Creatinine, Ser: 4.16 mg/dL — ABNORMAL HIGH (ref 0.44–1.00)
GFR calc Af Amer: 10 mL/min — ABNORMAL LOW (ref >60)
GFR calc non Af Amer: 9 mL/min — ABNORMAL LOW (ref >60)
GLUCOSE: 151 mg/dL — AB (ref 65–99)
POTASSIUM: 3.5 mmol/L (ref 3.5–5.1)
SODIUM: 137 mmol/L (ref 135–145)
TOTAL PROTEIN: 5.7 g/dL — AB (ref 6.5–8.1)

## 2015-03-20 LAB — C DIFFICILE QUICK SCREEN W PCR REFLEX
C DIFFICILE (CDIFF) INTERP: NEGATIVE
C DIFFICILE (CDIFF) TOXIN: NEGATIVE
C Diff antigen: NEGATIVE

## 2015-03-20 MED ORDER — LUBIPROSTONE 24 MCG PO CAPS
24.0000 ug | ORAL_CAPSULE | Freq: Two times a day (BID) | ORAL | Status: DC
Start: 2015-03-20 — End: 2015-03-24
  Administered 2015-03-20 – 2015-03-22 (×5): 24 ug via ORAL
  Filled 2015-03-20 (×11): qty 1

## 2015-03-20 MED ORDER — ALPRAZOLAM 0.5 MG PO TABS
0.5000 mg | ORAL_TABLET | Freq: Three times a day (TID) | ORAL | Status: DC | PRN
  Administered 2015-03-20 – 2015-03-24 (×4): 0.5 mg via ORAL
  Filled 2015-03-20 (×4): qty 1

## 2015-03-20 MED ORDER — AMLODIPINE BESYLATE 10 MG PO TABS
10.0000 mg | ORAL_TABLET | Freq: Every day | ORAL | Status: DC
  Administered 2015-03-21 – 2015-03-24 (×4): 10 mg via ORAL
  Filled 2015-03-20 (×4): qty 1

## 2015-03-20 MED ORDER — SODIUM CHLORIDE 0.9 % IV SOLN
INTRAVENOUS | Status: AC
  Administered 2015-03-21: 02:00:00 via INTRAVENOUS

## 2015-03-20 NOTE — Evaluation (Signed)
Occupational Therapy Evaluation Patient Details Name: Sandra [name] MRN: [id number] DOB: [birth year] Today's Date: 03/20/2015    History of Present Illness  This 81 y.o. Female admitted after sustaining unwitnessed fall at home.  She was found to have AKI, Rt facial trauma.  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    Clinical Impression   Pt admitted with above. She demonstrates the below listed deficits and will benefit from continued OT to maximize safety and independence with BADLs.  Pt presents to OT with generalized weakness, impaired balance, and impaired cognition.  She requires min A for ADLs.  She will need 24 hour assist at discharge, and lives alone.  Recommend SNF level rehab with possible transition to ALF.       Follow Up Recommendations  SNF;Supervision/Assistance - 24 hour    Equipment Recommendations  None recommended by OT;3 in 1 bedside comode;Tub/shower bench    Recommendations for Other Services       Precautions / Restrictions Precautions Precautions: Fall      Mobility Bed Mobility Overal bed mobility: Needs Assistance Bed Mobility: Sit to Supine       Sit to supine: Min guard      Transfers Overall transfer level: Needs assistance Equipment used: Rolling walker (2 wheeled) Transfers: Sit to/from [facility] Sit to Stand: Min guard Stand pivot transfers: Min assist       General transfer comment: min A for balance.  cues for hand placement     Balance Overall balance assessment: Needs assistance Sitting-balance support: Feet supported Sitting balance-Leahy Scale: Good     Standing balance support: During functional activity;Single extremity supported Standing balance-Leahy Scale: Poor Standing balance comment: requires UE support                             ADL Overall ADL's : Needs assistance/impaired Eating/Feeding: Independent    Grooming: Wash/dry hands;Min guard;Standing   Upper Body Bathing: Minimal assitance;Sitting   Lower Body Bathing: Minimal assistance;Sit to/from stand   Upper Body Dressing : Set up;Sitting   Lower Body Dressing: Minimal assistance;Sit to/from stand   Toilet Transfer: Minimal assistance;Ambulation;Comfort height toilet;RW   Toileting- Clothing Manipulation and Hygiene: Minimal assistance;Sit to/from stand Toileting - Clothing Manipulation Details (indicate cue type and reason): Pt with decreased thoroughness when cleaning peri area      Functional mobility during ADLs: Minimal assistance;Rolling walker General ADL Comments: Pt requires min A for balance and due to decreased endurance      Vision Additional Comments: 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   She reports she wears glasses all of the time, but due to severity of swelling over Lt eyelid, and face, she is unable to wear glasses.  She reports vision is blurry - unable to compare to her baseline, and she is unable to participate in formal assessment due to swelling    Perception     Praxis      Pertinent Vitals/Pain Pain Assessment: Faces Faces Pain Scale: Hurts little more Pain Location: bil. shoulders  Pain Descriptors / Indicators: Aching Pain Intervention(s): Monitored during session     Hand Dominance Right   Extremity/Trunk Assessment Upper Extremity Assessment Upper Extremity Assessment: Generalized weakness   Lower Extremity Assessment Lower Extremity Assessment: Defer to PT evaluation       Communication Communication Communication: No difficulties   Cognition Arousal/Alertness: Awake/alert Behavior During  Therapy: Flat affect Overall Cognitive Status: Impaired/Different from baseline Area of Impairment: Attention;Safety/judgement;Problem solving   Current Attention Level: Selective     Safety/Judgement: Decreased awareness of safety;Decreased awareness of deficits    Problem Solving: Slow processing;Difficulty sequencing     General Comments       Exercises       Shoulder Instructions      Home Living Family/patient expects to be discharged to:: Private residence Living Arrangements: Alone Available Help at Discharge: Family;Neighbor;Available PRN/intermittently Type of Home: House Home Access: Stairs to enter [employer] of Steps: 10   Home Layout: One level     Bathroom Shower/Tub: Tub/shower unit Shower/tub characteristics: Curtain Biochemist, clinical: Standard     Home Equipment: Environmental consultant - 2 wheels          Prior Functioning/Environment Level of Independence: Independent        Comments: Pt reports she ambulates without AD.  She drives and does all grocery shopping.  She denies falls (except the one which prompted admission), family states she has multiple bruises on legs and think she has had several falls     OT Diagnosis: Generalized weakness;Acute pain;Disturbance of vision   OT Problem List: Decreased strength;Decreased activity tolerance;Impaired balance (sitting and/or standing);Impaired vision/perception;Decreased safety awareness;Decreased knowledge of use of DME or AE;Pain   OT Treatment/Interventions: Self-care/ADL training;DME and/or AE instruction;Therapeutic activities;Visual/perceptual remediation/compensation;Cognitive remediation/compensation;Balance training;Patient/family education    OT Goals(Current goals can be found in the care plan section) Acute Rehab OT Goals Patient Stated Goal: did not state  OT Goal Formulation: With patient Time For Goal Achievement: 04/03/15 Potential to Achieve Goals: Good ADL Goals Pt Will Perform Grooming: with supervision;standing Pt Will Perform Upper Body Bathing: with set-up;sitting Pt Will Perform Lower Body Bathing: with supervision;sit to/from stand Pt Will Perform Upper Body Dressing: with set-up;sitting Pt Will Perform Lower Body Dressing: with  supervision;sit to/from stand Pt Will Transfer to Toilet: with supervision;ambulating;regular height toilet;bedside commode;grab bars Pt Will Perform Toileting - Clothing Manipulation and hygiene: with supervision;sit to/from stand  OT Frequency: Min 2X/week   Barriers to D/C: Decreased caregiver support          Co-evaluation              End of Session Equipment Utilized During Treatment: Rolling walker Nurse Communication: Mobility status  Activity Tolerance: Patient limited by fatigue Patient left: in chair;with call bell/phone within reach;with chair alarm set   Time: 7672-0947 OT Time Calculation (min): 23 min Charges:  OT General Charges $OT Visit: 1 Procedure OT Evaluation $Initial OT Evaluation Tier I: 1 Procedure OT Treatments $Self Care/Home Management : 8-22 mins G-Codes:    Cornellius Kropp M 03/31/15, 11:47 AM

## 2015-03-20 NOTE — Evaluation (Signed)
Physical Therapy Evaluation Patient Details Name: Sandra [name] MRN: [id number] DOB: Sep 20, 1928 Today's Date: 03/20/2015   History of Present Illness  This 81 y.o. Female admitted after sustaining unwitnessed fall at home.  She was found to have AKI, Rt facial trauma.  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   Clinical Impression  Patient  Did ambulate in room. Patient will benefit from PT to address problems listed in note below.    Follow Up Recommendations SNF;Supervision/Assistance - 24 hour    Equipment Recommendations  None recommended by PT    Recommendations for Other Services       Precautions / Restrictions Precautions Precautions: Fall      Mobility  Bed Mobility Overal bed mobility: Needs Assistance Bed Mobility: Sit to Supine       Sit to supine: Min assist   General bed mobility comments: assist with legs onto bed, reposition  Transfers Overall transfer level: Needs assistance Equipment used: Rolling walker (2 wheeled) Transfers: Sit to/from Stand Sit to Stand: Min assist Stand pivot transfers: Min assist       General transfer comment: min assist from toilet, low, minguard, cues for hand placement from Recliner.  Ambulation/Gait Ambulation/Gait assistance: Min assist Ambulation Distance (Feet): 20 Feet (x 2) Assistive device: Rolling walker (2 wheeled) Gait Pattern/deviations: Step-to pattern;Shuffle     General Gait Details: cues for safety  Stairs            Wheelchair Mobility    Modified Rankin (Stroke Patients Only)       Balance Overall balance assessment: Needs assistance Sitting-balance support: Feet supported Sitting balance-Leahy Scale: Good     Standing balance support: During functional activity;No upper extremity supported Standing balance-Leahy Scale: Poor Standing balance comment: requires UE support                               Pertinent Vitals/Pain Pain Assessment: Faces Faces Pain Scale: Hurts little more Pain Location: legs Pain Descriptors / Indicators: Aching;Tender Pain Intervention(s): Monitored during session    Home Living Family/patient expects to be discharged to:: Private residence Living Arrangements: Alone Available Help at Discharge: Family;Neighbor;Available PRN/intermittently Type of Home: House Home Access: Stairs to enter   [employer] of Steps: 10 Home Layout: One level Home Equipment: Walker - 2 wheels      Prior Function Level of Independence: Independent         Comments: Pt reports she ambulates without AD.  She drives and does all grocery shopping.  She denies falls (except the one which prompted admission), family states she has multiple bruises on legs and think she has had several falls      Hand Dominance   Dominant Hand: Right    Extremity/Trunk Assessment   Upper Extremity Assessment: Generalized weakness           Lower Extremity Assessment: Generalized weakness      Cervical / Trunk Assessment: Kyphotic  Communication   Communication: No difficulties  Cognition Arousal/Alertness: Awake/alert Behavior During Therapy: Flat affect Overall Cognitive Status: Impaired/Different from baseline Area of Impairment: Attention;Safety/judgement;Problem solving   Current Attention Level: Selective     Safety/Judgement: Decreased awareness of safety;Decreased awareness of deficits   Problem Solving: Slow processing;Difficulty sequencing      General Comments General comments (skin integrity, edema, etc.): family present durng eval     Exercises  Assessment/Plan    PT Assessment Patient needs continued PT services  PT Diagnosis Difficulty walking;Acute pain   PT Problem List Decreased strength;Decreased activity tolerance;Decreased balance;Decreased mobility;Decreased cognition;Decreased knowledge of precautions;Decreased  safety awareness;Decreased knowledge of use of DME;Pain  PT Treatment Interventions DME instruction;Gait training;Functional mobility training;Therapeutic activities;Therapeutic exercise;Patient/family education   PT Goals (Current goals can be found in the Care Plan section) Acute Rehab PT Goals Patient Stated Goal: to get back to walking PT Goal Formulation: With patient/family Time For Goal Achievement: 04/03/15 Potential to Achieve Goals: Good    Frequency Min 3X/week   Barriers to discharge Decreased caregiver support      Co-evaluation               End of Session Equipment Utilized During Treatment: Gait belt Activity Tolerance: Patient tolerated treatment well Patient left: in bed;with call bell/phone within reach;with bed alarm set;with family/visitor present Nurse Communication: Mobility status         Time: 3086-5784 PT Time Calculation (min) (ACUTE ONLY): 10 min   Charges:   PT Evaluation $Initial PT Evaluation Tier I: 1 Procedure     PT G CodesClaretha Cooper 03/20/2015, 1:33 PM Tresa Endo PT [phone]

## 2015-03-20 NOTE — Progress Notes (Signed)
PROGRESS NOTE    Sandra MEAS OEU:[id number] DOB: July 07, 1929 DOA: 03/19/2015 PCP: Walker Kehr, MD  HPI/Brief narrative 81 y.o. 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 In the ED, evaluation revealed mildly elevated blood pressures, low-grade temperature, creatinine 4.3, WBC 16.7, negative troponin and CK 1 and extensive imaging with chest x-ray, CT head, CT cervical spine, x-ray left shoulder, CT maxillofacial only significant for mild anterior subluxation of right TMJ. Hospitalist admission was requested.   Assessment/Plan:  Acute kidney injury - Creatinine normal at 0.95 on 01/30/15. Admitted with creatinine of 4.3 - Multifactorial secondary to dehydration from GI losses, poor oral intake and meds (ARB +/- NSAIDs) - Hold culprit medications. Check urine sodium and creatinine to calculate Fena (not sent despite order) - IV normal saline hydration, strict intake and output charting and follow daily BMP. - As per nursing, voids urine same time as stools and hence difficult to maintain strict intake and output but appears non-oliguric - Renal ultrasound without hydronephrosis - Despite hydration overnight, no significant change in renal function. Nephrology consulted 9/13.  Fall and right facial trauma and other superficial trauma/ataxia/peripheral neuropathy - History of gait ataxia for a few months (sees outpatient [facility]) and falls at home - No significant fractures or dislocation on imaging studies - Symptomatic treatment - PT and OT evaluation. Will SNF at discharge - Her falls may be multifactorial related to decreased proprioception (blind left eye, peripheral neuropathy), advanced age and  weakness  Nausea, vomiting and diarrhea - DD acute viral GE versus rule out C. Difficile - Check C. difficile PCR: Negative - Supportive treatment for now and hold off on antibiotics - Seems to have resolved. As per daughter, may have chronic intermittent symptoms related to abdominal discomfort/dyspepsia and outpatient GI workup/colonoscopy being contemplated.  Dehydration - IV fluids  Essential hypertension - Patient on several medications at home and it is not entirely clear which one of those she actually takes. Listed are atenolol, ARB and clonidine. - For now will started amlodipine 5 MG daily and when necessary IV hydralazine - Increase amlodipine to 10 MG daily  HLD - Possibly on statins at home.  Mild anterior subluxation of right TMJ - Asymptomatic at this time. Monitor  Right thyroid nodule - Seen on CT and reported with benign features and radiologist recommends no further evaluation based on consensus criteria   DVT prophylaxis: SCDs  Code Status: Full  Family Communication:Discussed with daughter at bedside. Disposition Plan: Possibly SNF when medically stable, may need 3-4 days in the hospital.    Consultants:  Nephrology  Procedures:  None  Antibiotics:  None  Subjective: Denies complaints. No acute issues reported by nursing.  Objective: Filed Vitals:   03/19/15 2100 03/20/15 0500 03/20/15 0518 03/20/15 1416  BP: 161/77   157/63  Pulse:    68  Temp:   98.7 F (37.1 C) 98.2 F (36.8 C)  TempSrc:   Oral Oral  Resp:   18 18  Height:      Weight:  60.3 kg (132 lb 15 oz)    SpO2:   95% 94%    Intake/Output Summary (Last 24 hours) at 03/20/15 1606 Last data filed at 03/20/15 1455  Gross per 24 hour  Intake  1407.5 ml  Output      4 ml  Net 1403.5 ml   Filed Weights   03/19/15 2000 03/20/15 0500  Weight: 60.3 kg (132 lb 15 oz) 60.3 kg (132 lb 15 oz)     Exam:  General exam: Pleasant elderly female lying comfortably in bed.  Extensive right facial bruising and infraorbital hematoma. (Picture in H&P) Respiratory system: Clear. No increased work of breathing. Cardiovascular system: S1 & S2 heard, RRR. No JVD, murmurs, gallops, clicks or pedal edema. Gastrointestinal system: Abdomen is nondistended, soft and nontender. Normal bowel sounds heard. Central nervous system: Alert and oriented. No focal neurological deficits. Extremities: Symmetric 5 x 5 power.   Data Reviewed: Basic Metabolic Panel:  Recent Labs Lab 03/19/15 1523 03/20/15 0436  NA 138 137  K 3.7 3.5  CL 95* 105  CO2 27 21*  GLUCOSE 170* 151*  BUN 49* 55*  CREATININE 4.30* 4.16*  CALCIUM 9.2 8.2*   Liver Function Tests:  Recent Labs Lab 03/20/15 0436  AST 22  ALT 13*  ALKPHOS 46  BILITOT 0.5  PROT 5.7*  ALBUMIN 3.5   No results for input(s): LIPASE, AMYLASE in the last 168 hours. No results for input(s): AMMONIA in the last 168 hours. CBC:  Recent Labs Lab 03/19/15 1523 03/20/15 0436  WBC 16.7* 15.0*  NEUTROABS 13.6*  --   HGB 14.2 13.1  HCT 40.7 38.7  MCV 95.1 96.8  PLT 283 265   Cardiac Enzymes:  Recent Labs Lab 03/19/15 1523  CKTOTAL 127  TROPONINI <0.03   BNP (last 3 results) No results for input(s): PROBNP in the last 8760 hours. CBG: No results for input(s): GLUCAP in the last 168 hours.  Recent Results (from the past 240 hour(s))  Urine culture     Status: None   Collection Time: 03/19/15  4:00 PM  Result Value Ref Range Status   Specimen Description URINE, CATHETERIZED  Final   Special Requests NONE  Final   Culture   Final    NO GROWTH 1 DAY Performed at [facility]    Report Status 03/20/2015 FINAL  Final  C difficile quick scan w PCR reflex     Status: None   Collection Time: 03/20/15  2:04 AM  Result Value Ref Range Status   C Diff antigen NEGATIVE NEGATIVE Final   C Diff toxin NEGATIVE NEGATIVE Final   C Diff interpretation Negative for toxigenic C. difficile  Final            Studies: Dg Chest 2 View  03/19/2015   CLINICAL DATA:  Fall sometime in the last 2 days. Left shoulder pain. Patient found with swelling of the face. Vomiting.  EXAM: CHEST  2 VIEW  COMPARISON:  03/09/2012  FINDINGS: Atherosclerotic aortic arch. Heart size within normal limits. Stable prominence of the epicardial adipose tissue. No pneumothorax or pleural effusion.  Mild thoracic spondylosis.  IMPRESSION: 1. No acute thoracic findings. 2. Atherosclerotic aortic arch.   Electronically Signed   By: Van Clines M.D.   On: 03/19/2015 16:30   Dg Shoulder Right  03/19/2015   CLINICAL DATA:  Patient lives at home alone and fell some time over the past 2 days. Patient's family had been trying to call with no answer. When they arrived from out of town the patient was in bed with the right side of her face badly swollen and bruised. Blood was found all over the bathroom. Patient had fallen gotten into bed and was  asleep when her family arrived. There was vomit found in the patient's bed. Patient is alert and oriented at this time, but has some loss of memory about the fall.  EXAM: RIGHT SHOULDER - 2+ VIEW  COMPARISON:  None.  FINDINGS: No fracture or dislocation. Mild AC joint osteoarthritis. Glenohumeral joint is relatively well maintained. Bones are demineralized. Soft tissues are unremarkable.  IMPRESSION: No fracture or dislocation.   Electronically Signed   By: Lajean Manes M.D.   On: 03/19/2015 19:47   Ct Head Wo Contrast  03/19/2015   CLINICAL DATA:  Patient lives at home alone and fell some time over the past 2 days. Patient's family had been trying to call with no answer. When they arrived from out of town the patient was in bed with the right side of her face badly swollen and bruised. Blood was found all over the bathroom. Patient had fallen gotten into bed and was asleep when her family arrived. There was vomit found in the patient's bed. Patient is alert and oriented at this time,  but has some loss of memory about the fall. Pt has hypertension.  EXAM: CT HEAD WITHOUT CONTRAST  CT MAXILLOFACIAL WITHOUT CONTRAST  CT CERVICAL SPINE WITHOUT CONTRAST  TECHNIQUE: Multidetector CT imaging of the head, cervical spine, and maxillofacial structures were performed using the standard protocol without intravenous contrast. Multiplanar CT image reconstructions of the cervical spine and maxillofacial structures were also generated.  COMPARISON:  MRI and 02/08/2015  FINDINGS: CT HEAD FINDINGS  There is central and cortical atrophy. Periventricular white matter changes are consistent with small vessel disease. There is no intra or extra-axial fluid collection or mass lesion. The basilar cisterns and ventricles have a normal appearance. There is no CT evidence for acute infarction or hemorrhage. Bone windows show soft tissue edema superficial to the right zygomatic arch and right orbit. No calvarial fracture identified.  CT MAXILLOFACIAL FINDINGS  There is moderate preseptal edema of the right orbit. The right globe is intact. There is significant soft tissue swelling superficial to the right zygomatic arch. A hematoma is seen superficial to the lateral wall of the maxilla which measures 3.4 x 1.6 cm. Soft tissue swelling extends inferiorly to level of the mandible. There is mild anterior subluxation of the temporomandibular joint without frank subluxation or fracture.  There is chronic asymmetry of the left globe without evidence for acute injury.  The nasal bones, bony nasal septum, zygomatic arches, mandible and pterygoid plates are intact. No evidence for acute fracture of the sinus walls or orbits.  CT CERVICAL SPINE FINDINGS  There is significant degenerative disc disease of the cervical spine, with moderate narrowing and posterior osteophyte formation at C3-4, C5-6. There is no acute fracture or subluxation. Note is made of atherosclerotic calcification of the carotid arteries bilaterally. Incidental  note is made of a right thyroid nodule, measuring 1.4 cm.  IMPRESSION: 1. Atrophy and small vessel disease. 2.  No evidence for acute intracranial abnormality. 3. Significant soft tissue swelling involving the right side of the face extending from the right orbit inferiorly to the right aspect of the mandible. There is a moderate hematoma along the lateral wall of the right maxilla which measures 3.4 cm. Soft tissue swelling involves the preseptal soft tissues of the right orbit. The globe is intact. 4. Mild anterior subluxation of the right temporomandibular joint. No dislocation or fracture. 5. Chronic asymmetry of the left globe without acute injury. 6. Degenerative changes in the cervical spine. No  evidence for acute cervical spine abnormality. 7. Incidental note of right thyroid nodule with benign features. No further evaluation is felt to be necessary based on consensus criteria. 8. Carotid calcifications.   Electronically Signed   By: Nolon Nations M.D.   On: 03/19/2015 16:36   Ct Cervical Spine Wo Contrast  03/19/2015   CLINICAL DATA:  Patient lives at home alone and fell some time over the past 2 days. Patient's family had been trying to call with no answer. When they arrived from out of town the patient was in bed with the right side of her face badly swollen and bruised. Blood was found all over the bathroom. Patient had fallen gotten into bed and was asleep when her family arrived. There was vomit found in the patient's bed. Patient is alert and oriented at this time, but has some loss of memory about the fall. Pt has hypertension.  EXAM: CT HEAD WITHOUT CONTRAST  CT MAXILLOFACIAL WITHOUT CONTRAST  CT CERVICAL SPINE WITHOUT CONTRAST  TECHNIQUE: Multidetector CT imaging of the head, cervical spine, and maxillofacial structures were performed using the standard protocol without intravenous contrast. Multiplanar CT image reconstructions of the cervical spine and maxillofacial structures were also  generated.  COMPARISON:  MRI and 02/08/2015  FINDINGS: CT HEAD FINDINGS  There is central and cortical atrophy. Periventricular white matter changes are consistent with small vessel disease. There is no intra or extra-axial fluid collection or mass lesion. The basilar cisterns and ventricles have a normal appearance. There is no CT evidence for acute infarction or hemorrhage. Bone windows show soft tissue edema superficial to the right zygomatic arch and right orbit. No calvarial fracture identified.  CT MAXILLOFACIAL FINDINGS  There is moderate preseptal edema of the right orbit. The right globe is intact. There is significant soft tissue swelling superficial to the right zygomatic arch. A hematoma is seen superficial to the lateral wall of the maxilla which measures 3.4 x 1.6 cm. Soft tissue swelling extends inferiorly to level of the mandible. There is mild anterior subluxation of the temporomandibular joint without frank subluxation or fracture.  There is chronic asymmetry of the left globe without evidence for acute injury.  The nasal bones, bony nasal septum, zygomatic arches, mandible and pterygoid plates are intact. No evidence for acute fracture of the sinus walls or orbits.  CT CERVICAL SPINE FINDINGS  There is significant degenerative disc disease of the cervical spine, with moderate narrowing and posterior osteophyte formation at C3-4, C5-6. There is no acute fracture or subluxation. Note is made of atherosclerotic calcification of the carotid arteries bilaterally. Incidental note is made of a right thyroid nodule, measuring 1.4 cm.  IMPRESSION: 1. Atrophy and small vessel disease. 2.  No evidence for acute intracranial abnormality. 3. Significant soft tissue swelling involving the right side of the face extending from the right orbit inferiorly to the right aspect of the mandible. There is a moderate hematoma along the lateral wall of the right maxilla which measures 3.4 cm. Soft tissue swelling  involves the preseptal soft tissues of the right orbit. The globe is intact. 4. Mild anterior subluxation of the right temporomandibular joint. No dislocation or fracture. 5. Chronic asymmetry of the left globe without acute injury. 6. Degenerative changes in the cervical spine. No evidence for acute cervical spine abnormality. 7. Incidental note of right thyroid nodule with benign features. No further evaluation is felt to be necessary based on consensus criteria. 8. Carotid calcifications.   Electronically Signed   By:  Nolon Nations M.D.   On: 03/19/2015 16:36   US Renal  03/20/2015   CLINICAL DATA:  Acute kidney insufficiency.  EXAM: RENAL / URINARY TRACT ULTRASOUND COMPLETE  COMPARISON:  None.  FINDINGS: Right Kidney:  Length: 11.5 cm. Echogenicity within normal limits. No mass or hydronephrosis visualized.  Left Kidney:  Length: 11.4 cm. Echogenicity within normal limits. In the lower pole left kidney, there is a 1.3 x 1.1 x 1.4 cm cyst with peripheral calcifications. No hydronephrosis visualized.  Bladder:  Appears normal for degree of bladder distention.  IMPRESSION: No acute abnormality.  1.4 cm cyst in lower pole left kidney with peripheral calcifications.   Electronically Signed   By: Abelardo Diesel M.D.   On: 03/20/2015 10:34   Dg Shoulder Left  03/19/2015   CLINICAL DATA:  Fall recently with left shoulder pain. Initial encounter.  EXAM: LEFT SHOULDER - 2+ VIEW  COMPARISON:  None.  FINDINGS: No acute fracture or dislocation is identified. There are moderate degenerative changes involving the glenohumeral joint and AC joint. Cystic changes are seen in the lateral humeral head. No bony destruction identified. Soft tissues are unremarkable.  IMPRESSION: No acute fracture. Moderate degenerative changes present involving the glenohumeral joint and AC joint.   Electronically Signed   By: Aletta Edouard M.D.   On: 03/19/2015 16:34   Ct Maxillofacial Wo Cm  03/19/2015   CLINICAL DATA:  Patient lives  at home alone and fell some time over the past 2 days. Patient's family had been trying to call with no answer. When they arrived from out of town the patient was in bed with the right side of her face badly swollen and bruised. Blood was found all over the bathroom. Patient had fallen gotten into bed and was asleep when her family arrived. There was vomit found in the patient's bed. Patient is alert and oriented at this time, but has some loss of memory about the fall. Pt has hypertension.  EXAM: CT HEAD WITHOUT CONTRAST  CT MAXILLOFACIAL WITHOUT CONTRAST  CT CERVICAL SPINE WITHOUT CONTRAST  TECHNIQUE: Multidetector CT imaging of the head, cervical spine, and maxillofacial structures were performed using the standard protocol without intravenous contrast. Multiplanar CT image reconstructions of the cervical spine and maxillofacial structures were also generated.  COMPARISON:  MRI and 02/08/2015  FINDINGS: CT HEAD FINDINGS  There is central and cortical atrophy. Periventricular white matter changes are consistent with small vessel disease. There is no intra or extra-axial fluid collection or mass lesion. The basilar cisterns and ventricles have a normal appearance. There is no CT evidence for acute infarction or hemorrhage. Bone windows show soft tissue edema superficial to the right zygomatic arch and right orbit. No calvarial fracture identified.  CT MAXILLOFACIAL FINDINGS  There is moderate preseptal edema of the right orbit. The right globe is intact. There is significant soft tissue swelling superficial to the right zygomatic arch. A hematoma is seen superficial to the lateral wall of the maxilla which measures 3.4 x 1.6 cm. Soft tissue swelling extends inferiorly to level of the mandible. There is mild anterior subluxation of the temporomandibular joint without frank subluxation or fracture.  There is chronic asymmetry of the left globe without evidence for acute injury.  The nasal bones, bony nasal septum,  zygomatic arches, mandible and pterygoid plates are intact. No evidence for acute fracture of the sinus walls or orbits.  CT CERVICAL SPINE FINDINGS  There is significant degenerative disc disease of the cervical spine, with moderate  narrowing and posterior osteophyte formation at C3-4, C5-6. There is no acute fracture or subluxation. Note is made of atherosclerotic calcification of the carotid arteries bilaterally. Incidental note is made of a right thyroid nodule, measuring 1.4 cm.  IMPRESSION: 1. Atrophy and small vessel disease. 2.  No evidence for acute intracranial abnormality. 3. Significant soft tissue swelling involving the right side of the face extending from the right orbit inferiorly to the right aspect of the mandible. There is a moderate hematoma along the lateral wall of the right maxilla which measures 3.4 cm. Soft tissue swelling involves the preseptal soft tissues of the right orbit. The globe is intact. 4. Mild anterior subluxation of the right temporomandibular joint. No dislocation or fracture. 5. Chronic asymmetry of the left globe without acute injury. 6. Degenerative changes in the cervical spine. No evidence for acute cervical spine abnormality. 7. Incidental note of right thyroid nodule with benign features. No further evaluation is felt to be necessary based on consensus criteria. 8. Carotid calcifications.   Electronically Signed   By: Nolon Nations M.D.   On: 03/19/2015 16:36        Scheduled Meds: . amLODipine  5 mg Oral Daily  . sodium chloride  3 mL Intravenous Q12H   Continuous Infusions:    Principal Problem:   AKI (acute kidney injury) Active Problems:   Dyslipidemia   Essential hypertension   Ataxia   Peripheral neuropathy   Fall at home   Facial trauma-right side   Nausea vomiting and diarrhea   Dehydration   Acute kidney injury    Time spent: 43 minutes    Meztli Llanas, MD, FACP, FHM. [facility] Pager [phone]  If 7PM-7AM,  please contact night-coverage [email or web address] Password [email or web address] 03/20/2015, 4:06 PM    LOS: 1 day

## 2015-03-20 NOTE — Consult Note (Signed)
Sandra [name] is a 81 y.o. 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  Pt apparently had a couple of days of vomiting and diarrhea preceding the event. Her abdomen has been hurting.  She has indomethacin listed as a current medication but daughter does not think she was taking it. She was taking losartan.  She does not recall the fall, but was found at home in the bed.  She denied dizziness or lightheadedness. In the ED, evaluation revealed mildly elevated blood pressures, low-grade temperature, creatinine 4.3 and BUN 49, compared with a creatinine of 0.95 on Sept 12. UA was neg except 30 mg/dl protein and renal ultrasound today was negative for hydronephrosis.  Creatinine today was 4.16.  She does not have a foley.  UOP has been difficult to record. Other workup in the ED included a WBC 16.7, negative troponin and CK 127 and extensive imaging with chest x-ray, noncontrasted CT head, CT cervical spine, x-ray left shoulder, CT maxillofacial only significant for mild anterior subluxation of right TMJ.  Renal is asked to assist in management.  Past Medical History  Diagnosis Date  . Colon polyps   . Hyperlipidemia   . HTN (hypertension)   . Osteoporosis   . Anxiety   . Vitamin D deficiency   . Strabismus     L eye  . LBP (low back pain) 2011    right  . Peripheral neuropathy   . Vitamin B12 deficiency    Past Surgical History  Procedure Laterality Date  . Abdominal hysterectomy     Social History:  reports that she has never smoked. She does not have any smokeless tobacco history on file. She reports that she does not drink alcohol or use illicit drugs. Allergies:  Allergies  Allergen Reactions   . Prednisone Shortness Of Breath and Other (See Comments)    dyspnea  . Aspirin     Upset stomach  . Azithromycin     REACTION: does not work She can take Biaxin  . Enalapril Maleate     REACTION: cough  . Penicillins    Family History  Problem Relation Age of Onset  . Hypertension    . Heart disease Mother 78    chf  . Early death Father   . Heart disease Father 35    mi  . Diabetes Sister   . Heart disease Sister     chf    Medications:  Prior to Admission:  Prescriptions prior to admission  Medication Sig Dispense Refill Last Dose  . ALPRAZolam (XANAX) 0.5 MG tablet TAKE 1 TO 2 TABLETS BY MOUTH 3 TIMES A DAY AS NEEDED FOR ANXIETY (Patient taking differently: Take 0.5-1 mg by mouth 3 (three) times daily as needed for anxiety. ) 90 tablet 1 Taking  . Ascorbic Acid (VITAMIN C) 100 MG tablet Take 100 mg by mouth daily.     Marland Kitchen atenolol (TENORMIN) 100 MG tablet Take 1 tablet (100 mg total) by mouth 2 (two) times daily. 180 tablet 3 Taking  . Cholecalciferol (VITAMIN D3) 1000 UNITS tablet Take 1,000 Units by mouth daily.     Taking  . cloNIDine (CATAPRES) 0.1 MG tablet Take 1 tablet (0.1 mg  total) by mouth 2 (two) times daily. For sweating and for the blood pressure. 180 tablet 3 Taking  . ergocalciferol (VITAMIN D2) 50000 UNITS capsule Take 1 capsule (50,000 Units total) by mouth once a week. 6 capsule 0 Taking  . indomethacin (INDOCIN) 25 MG capsule Take 1 capsule (25 mg total) by mouth 3 (three) times daily as needed. (Patient taking differently: Take 25 mg by mouth 3 (three) times daily as needed for mild pain or moderate pain. ) 60 capsule 1   . lubiprostone (AMITIZA) 24 MCG capsule Take 1 capsule (24 mcg total) by mouth 2 (two) times daily with a meal. 60 capsule 5   . QUEtiapine (SEROQUEL) 100 MG tablet Take 100-200 mg by mouth at bedtime as needed (insomnia).     . simvastatin (ZOCOR) 40 MG tablet Take 1 tablet (40 mg total) by mouth at bedtime. 90 tablet 3 Not Taking  .  vitamin B-12 (CYANOCOBALAMIN) 1000 MCG tablet Take 1,000 mcg by mouth daily.     Taking   Scheduled: . [START ON 03/21/2015] amLODipine  10 mg Oral Daily  . lubiprostone  24 mcg Oral BID WC  . sodium chloride  3 mL Intravenous Q12H     ROS: diff to obtain  Blood pressure 157/63, pulse 68, temperature 98.2 F (36.8 C), temperature source Oral, resp. rate 18, height _0  (1.6 m), weight 60.3 kg (132 lb 15 oz), SpO2 94 %.  General appearance: alert and cooperative Head: large bruise right face extending from orbit down cheek, eccyhmotic Eyes: swollen closed OD but can open, blind OS Nose: Nares normal. Septum midline. Mucosa normal. No drainage or sinus tenderness. Resp: clear to auscultation bilaterally Chest wall: no tenderness Cardio: regular rate and rhythm, S1, S2 normal, no murmur, click, rub or gallop GI: soft, non-tender; bowel sounds normal; no masses,  no organomegaly Extremities: extremities normal, atraumatic, no cyanosis or edema Skin: Skin color, texture, turgor normal. No rashes or lesions Neurologic: disinterested participant, appears nonfocal Results for orders placed or performed during the hospital encounter of 03/19/15 (from the past 48 hour(s))  CBC with Differential     Status: Abnormal   Collection Time: 03/19/15  3:23 PM  Result Value Ref Range   WBC 16.7 (H) 4.0 - 10.5 K/uL   RBC 4.28 3.87 - 5.11 MIL/uL   Hemoglobin 14.2 12.0 - 15.0 g/dL   HCT 40.7 36.0 - 46.0 %   MCV 95.1 78.0 - 100.0 fL   MCH 33.2 26.0 - 34.0 pg   MCHC 34.9 30.0 - 36.0 g/dL   RDW 12.5 11.5 - 15.5 %   Platelets 283 150 - 400 K/uL   Neutrophils Relative % 82 (H) 43 - 77 %   Neutro Abs 13.6 (H) 1.7 - 7.7 K/uL   Lymphocytes Relative 8 (L) 12 - 46 %   Lymphs Abs 1.4 0.7 - 4.0 K/uL   Monocytes Relative 10 3 - 12 %   Monocytes Absolute 1.7 (H) 0.1 - 1.0 K/uL   Eosinophils Relative 0 0 - 5 %   Eosinophils Absolute 0.0 0.0 - 0.7 K/uL   Basophils Relative 0 0 - 1 %   Basophils Absolute 0.0  0.0 - 0.1 K/uL  Basic metabolic panel     Status: Abnormal   Collection Time: 03/19/15  3:23 PM  Result Value Ref Range   Sodium 138 135 - 145 mmol/L   Potassium 3.7 3.5 - 5.1 mmol/L   Chloride 95 (L) 101 - 111 mmol/L  CO2 27 22 - 32 mmol/L   Glucose, Bld 170 (H) 65 - 99 mg/dL   BUN 49 (H) 6 - 20 mg/dL   Creatinine, Ser 4.30 (H) 0.44 - 1.00 mg/dL   Calcium 9.2 8.9 - 10.3 mg/dL   GFR calc non Af Amer 9 (L) >60 mL/min   GFR calc Af Amer 10 (L) >60 mL/min    Comment: (NOTE) The eGFR has been calculated using the CKD EPI equation. This calculation has not been validated in all clinical situations. eGFR's persistently <60 mL/min signify possible Chronic Kidney Disease.    Anion gap 16 (H) 5 - 15  CK     Status: None   Collection Time: 03/19/15  3:23 PM  Result Value Ref Range   Total CK 127 38 - 234 U/L  Lactic acid, plasma     Status: None   Collection Time: 03/19/15  3:23 PM  Result Value Ref Range   Lactic Acid, Venous 1.6 0.5 - 2.0 mmol/L  Troponin I     Status: None   Collection Time: 03/19/15  3:23 PM  Result Value Ref Range   Troponin I <0.03 <0.031 ng/mL    Comment:        NO INDICATION OF MYOCARDIAL INJURY.   Urinalysis, Routine w reflex microscopic (not at [facility])     Status: Abnormal   Collection Time: 03/19/15  4:00 PM  Result Value Ref Range   Color, Urine YELLOW YELLOW   APPearance CLOUDY (A) CLEAR   Specific Gravity, Urine 1.020 1.005 - 1.030   pH 5.0 5.0 - 8.0   Glucose, UA NEGATIVE NEGATIVE mg/dL   Hgb urine dipstick TRACE (A) NEGATIVE   Bilirubin Urine SMALL (A) NEGATIVE   Ketones, ur NEGATIVE NEGATIVE mg/dL   Protein, ur 30 (A) NEGATIVE mg/dL   Urobilinogen, UA 0.2 0.0 - 1.0 mg/dL   Nitrite NEGATIVE NEGATIVE   Leukocytes, UA NEGATIVE NEGATIVE  Urine culture     Status: None   Collection Time: 03/19/15  4:00 PM  Result Value Ref Range   Specimen Description URINE, CATHETERIZED    Special Requests NONE    Culture      NO GROWTH 1 DAY Performed at  [facility]    Report Status 03/20/2015 FINAL   Urine microscopic-add on     Status: Abnormal   Collection Time: 03/19/15  4:00 PM  Result Value Ref Range   RBC / HPF 0-2 <3 RBC/hpf   Casts GRANULAR CAST (A) NEGATIVE   Urine-Other AMORPHOUS URATES/PHOSPHATES   C difficile quick scan w PCR reflex     Status: None   Collection Time: 03/20/15  2:04 AM  Result Value Ref Range   C Diff antigen NEGATIVE NEGATIVE   C Diff toxin NEGATIVE NEGATIVE   C Diff interpretation Negative for toxigenic C. difficile   Comprehensive metabolic panel     Status: Abnormal   Collection Time: 03/20/15  4:36 AM  Result Value Ref Range   Sodium 137 135 - 145 mmol/L   Potassium 3.5 3.5 - 5.1 mmol/L   Chloride 105 101 - 111 mmol/L   CO2 21 (L) 22 - 32 mmol/L   Glucose, Bld 151 (H) 65 - 99 mg/dL   BUN 55 (H) 6 - 20 mg/dL   Creatinine, Ser 4.16 (H) 0.44 - 1.00 mg/dL   Calcium 8.2 (L) 8.9 - 10.3 mg/dL   Total Protein 5.7 (L) 6.5 - 8.1 g/dL   Albumin 3.5 3.5 - 5.0 g/dL  AST 22 15 - 41 U/L   ALT 13 (L) 14 - 54 U/L   Alkaline Phosphatase 46 38 - 126 U/L   Total Bilirubin 0.5 0.3 - 1.2 mg/dL   GFR calc non Af Amer 9 (L) >60 mL/min   GFR calc Af Amer 10 (L) >60 mL/min    Comment: (NOTE) The eGFR has been calculated using the CKD EPI equation. This calculation has not been validated in all clinical situations. eGFR's persistently <60 mL/min signify possible Chronic Kidney Disease.    Anion gap 11 5 - 15  CBC     Status: Abnormal   Collection Time: 03/20/15  4:36 AM  Result Value Ref Range   WBC 15.0 (H) 4.0 - 10.5 K/uL   RBC 4.00 3.87 - 5.11 MIL/uL   Hemoglobin 13.1 12.0 - 15.0 g/dL   HCT 38.7 36.0 - 46.0 %   MCV 96.8 78.0 - 100.0 fL   MCH 32.8 26.0 - 34.0 pg   MCHC 33.9 30.0 - 36.0 g/dL   RDW 12.6 11.5 - 15.5 %   Platelets 265 150 - 400 K/uL   Dg Chest 2 View  03/19/2015   CLINICAL DATA:  Fall sometime in the last 2 days. Left shoulder pain. Patient found with swelling of the face.  Vomiting.  EXAM: CHEST  2 VIEW  COMPARISON:  03/09/2012  FINDINGS: Atherosclerotic aortic arch. Heart size within normal limits. Stable prominence of the epicardial adipose tissue. No pneumothorax or pleural effusion.  Mild thoracic spondylosis.  IMPRESSION: 1. No acute thoracic findings. 2. Atherosclerotic aortic arch.   Electronically Signed   By: Van Clines M.D.   On: 03/19/2015 16:30   Dg Shoulder Right  03/19/2015   CLINICAL DATA:  Patient lives at home alone and fell some time over the past 2 days. Patient's family had been trying to call with no answer. When they arrived from out of town the patient was in bed with the right side of her face badly swollen and bruised. Blood was found all over the bathroom. Patient had fallen gotten into bed and was asleep when her family arrived. There was vomit found in the patient's bed. Patient is alert and oriented at this time, but has some loss of memory about the fall.  EXAM: RIGHT SHOULDER - 2+ VIEW  COMPARISON:  None.  FINDINGS: No fracture or dislocation. Mild AC joint osteoarthritis. Glenohumeral joint is relatively well maintained. Bones are demineralized. Soft tissues are unremarkable.  IMPRESSION: No fracture or dislocation.   Electronically Signed   By: Lajean Manes M.D.   On: 03/19/2015 19:47   Ct Head Wo Contrast  03/19/2015   CLINICAL DATA:  Patient lives at home alone and fell some time over the past 2 days. Patient's family had been trying to call with no answer. When they arrived from out of town the patient was in bed with the right side of her face badly swollen and bruised. Blood was found all over the bathroom. Patient had fallen gotten into bed and was asleep when her family arrived. There was vomit found in the patient's bed. Patient is alert and oriented at this time, but has some loss of memory about the fall. Pt has hypertension.  EXAM: CT HEAD WITHOUT CONTRAST  CT MAXILLOFACIAL WITHOUT CONTRAST  CT CERVICAL SPINE WITHOUT CONTRAST   TECHNIQUE: Multidetector CT imaging of the head, cervical spine, and maxillofacial structures were performed using the standard protocol without intravenous contrast. Multiplanar CT image reconstructions of the  cervical spine and maxillofacial structures were also generated.  COMPARISON:  MRI and 02/08/2015  FINDINGS: CT HEAD FINDINGS  There is central and cortical atrophy. Periventricular white matter changes are consistent with small vessel disease. There is no intra or extra-axial fluid collection or mass lesion. The basilar cisterns and ventricles have a normal appearance. There is no CT evidence for acute infarction or hemorrhage. Bone windows show soft tissue edema superficial to the right zygomatic arch and right orbit. No calvarial fracture identified.  CT MAXILLOFACIAL FINDINGS  There is moderate preseptal edema of the right orbit. The right globe is intact. There is significant soft tissue swelling superficial to the right zygomatic arch. A hematoma is seen superficial to the lateral wall of the maxilla which measures 3.4 x 1.6 cm. Soft tissue swelling extends inferiorly to level of the mandible. There is mild anterior subluxation of the temporomandibular joint without frank subluxation or fracture.  There is chronic asymmetry of the left globe without evidence for acute injury.  The nasal bones, bony nasal septum, zygomatic arches, mandible and pterygoid plates are intact. No evidence for acute fracture of the sinus walls or orbits.  CT CERVICAL SPINE FINDINGS  There is significant degenerative disc disease of the cervical spine, with moderate narrowing and posterior osteophyte formation at C3-4, C5-6. There is no acute fracture or subluxation. Note is made of atherosclerotic calcification of the carotid arteries bilaterally. Incidental note is made of a right thyroid nodule, measuring 1.4 cm.  IMPRESSION: 1. Atrophy and small vessel disease. 2.  No evidence for acute intracranial abnormality. 3.  Significant soft tissue swelling involving the right side of the face extending from the right orbit inferiorly to the right aspect of the mandible. There is a moderate hematoma along the lateral wall of the right maxilla which measures 3.4 cm. Soft tissue swelling involves the preseptal soft tissues of the right orbit. The globe is intact. 4. Mild anterior subluxation of the right temporomandibular joint. No dislocation or fracture. 5. Chronic asymmetry of the left globe without acute injury. 6. Degenerative changes in the cervical spine. No evidence for acute cervical spine abnormality. 7. Incidental note of right thyroid nodule with benign features. No further evaluation is felt to be necessary based on consensus criteria. 8. Carotid calcifications.   Electronically Signed   By: Nolon Nations M.D.   On: 03/19/2015 16:36   Ct Cervical Spine Wo Contrast  03/19/2015   CLINICAL DATA:  Patient lives at home alone and fell some time over the past 2 days. Patient's family had been trying to call with no answer. When they arrived from out of town the patient was in bed with the right side of her face badly swollen and bruised. Blood was found all over the bathroom. Patient had fallen gotten into bed and was asleep when her family arrived. There was vomit found in the patient's bed. Patient is alert and oriented at this time, but has some loss of memory about the fall. Pt has hypertension.  EXAM: CT HEAD WITHOUT CONTRAST  CT MAXILLOFACIAL WITHOUT CONTRAST  CT CERVICAL SPINE WITHOUT CONTRAST  TECHNIQUE: Multidetector CT imaging of the head, cervical spine, and maxillofacial structures were performed using the standard protocol without intravenous contrast. Multiplanar CT image reconstructions of the cervical spine and maxillofacial structures were also generated.  COMPARISON:  MRI and 02/08/2015  FINDINGS: CT HEAD FINDINGS  There is central and cortical atrophy. Periventricular white matter changes are consistent  with small vessel disease. There is  no intra or extra-axial fluid collection or mass lesion. The basilar cisterns and ventricles have a normal appearance. There is no CT evidence for acute infarction or hemorrhage. Bone windows show soft tissue edema superficial to the right zygomatic arch and right orbit. No calvarial fracture identified.  CT MAXILLOFACIAL FINDINGS  There is moderate preseptal edema of the right orbit. The right globe is intact. There is significant soft tissue swelling superficial to the right zygomatic arch. A hematoma is seen superficial to the lateral wall of the maxilla which measures 3.4 x 1.6 cm. Soft tissue swelling extends inferiorly to level of the mandible. There is mild anterior subluxation of the temporomandibular joint without frank subluxation or fracture.  There is chronic asymmetry of the left globe without evidence for acute injury.  The nasal bones, bony nasal septum, zygomatic arches, mandible and pterygoid plates are intact. No evidence for acute fracture of the sinus walls or orbits.  CT CERVICAL SPINE FINDINGS  There is significant degenerative disc disease of the cervical spine, with moderate narrowing and posterior osteophyte formation at C3-4, C5-6. There is no acute fracture or subluxation. Note is made of atherosclerotic calcification of the carotid arteries bilaterally. Incidental note is made of a right thyroid nodule, measuring 1.4 cm.  IMPRESSION: 1. Atrophy and small vessel disease. 2.  No evidence for acute intracranial abnormality. 3. Significant soft tissue swelling involving the right side of the face extending from the right orbit inferiorly to the right aspect of the mandible. There is a moderate hematoma along the lateral wall of the right maxilla which measures 3.4 cm. Soft tissue swelling involves the preseptal soft tissues of the right orbit. The globe is intact. 4. Mild anterior subluxation of the right temporomandibular joint. No dislocation or fracture.  5. Chronic asymmetry of the left globe without acute injury. 6. Degenerative changes in the cervical spine. No evidence for acute cervical spine abnormality. 7. Incidental note of right thyroid nodule with benign features. No further evaluation is felt to be necessary based on consensus criteria. 8. Carotid calcifications.   Electronically Signed   By: Nolon Nations M.D.   On: 03/19/2015 16:36   US Renal  03/20/2015   CLINICAL DATA:  Acute kidney insufficiency.  EXAM: RENAL / URINARY TRACT ULTRASOUND COMPLETE  COMPARISON:  None.  FINDINGS: Right Kidney:  Length: 11.5 cm. Echogenicity within normal limits. No mass or hydronephrosis visualized.  Left Kidney:  Length: 11.4 cm. Echogenicity within normal limits. In the lower pole left kidney, there is a 1.3 x 1.1 x 1.4 cm cyst with peripheral calcifications. No hydronephrosis visualized.  Bladder:  Appears normal for degree of bladder distention.  IMPRESSION: No acute abnormality.  1.4 cm cyst in lower pole left kidney with peripheral calcifications.   Electronically Signed   By: Abelardo Diesel M.D.   On: 03/20/2015 10:34   Dg Shoulder Left  03/19/2015   CLINICAL DATA:  Fall recently with left shoulder pain. Initial encounter.  EXAM: LEFT SHOULDER - 2+ VIEW  COMPARISON:  None.  FINDINGS: No acute fracture or dislocation is identified. There are moderate degenerative changes involving the glenohumeral joint and AC joint. Cystic changes are seen in the lateral humeral head. No bony destruction identified. Soft tissues are unremarkable.  IMPRESSION: No acute fracture. Moderate degenerative changes present involving the glenohumeral joint and AC joint.   Electronically Signed   By: Aletta Edouard M.D.   On: 03/19/2015 16:34   Ct Maxillofacial Wo Cm  03/19/2015   CLINICAL  DATA:  Patient lives at home alone and fell some time over the past 2 days. Patient's family had been trying to call with no answer. When they arrived from out of town the patient was in bed  with the right side of her face badly swollen and bruised. Blood was found all over the bathroom. Patient had fallen gotten into bed and was asleep when her family arrived. There was vomit found in the patient's bed. Patient is alert and oriented at this time, but has some loss of memory about the fall. Pt has hypertension.  EXAM: CT HEAD WITHOUT CONTRAST  CT MAXILLOFACIAL WITHOUT CONTRAST  CT CERVICAL SPINE WITHOUT CONTRAST  TECHNIQUE: Multidetector CT imaging of the head, cervical spine, and maxillofacial structures were performed using the standard protocol without intravenous contrast. Multiplanar CT image reconstructions of the cervical spine and maxillofacial structures were also generated.  COMPARISON:  MRI and 02/08/2015  FINDINGS: CT HEAD FINDINGS  There is central and cortical atrophy. Periventricular white matter changes are consistent with small vessel disease. There is no intra or extra-axial fluid collection or mass lesion. The basilar cisterns and ventricles have a normal appearance. There is no CT evidence for acute infarction or hemorrhage. Bone windows show soft tissue edema superficial to the right zygomatic arch and right orbit. No calvarial fracture identified.  CT MAXILLOFACIAL FINDINGS  There is moderate preseptal edema of the right orbit. The right globe is intact. There is significant soft tissue swelling superficial to the right zygomatic arch. A hematoma is seen superficial to the lateral wall of the maxilla which measures 3.4 x 1.6 cm. Soft tissue swelling extends inferiorly to level of the mandible. There is mild anterior subluxation of the temporomandibular joint without frank subluxation or fracture.  There is chronic asymmetry of the left globe without evidence for acute injury.  The nasal bones, bony nasal septum, zygomatic arches, mandible and pterygoid plates are intact. No evidence for acute fracture of the sinus walls or orbits.  CT CERVICAL SPINE FINDINGS  There is significant  degenerative disc disease of the cervical spine, with moderate narrowing and posterior osteophyte formation at C3-4, C5-6. There is no acute fracture or subluxation. Note is made of atherosclerotic calcification of the carotid arteries bilaterally. Incidental note is made of a right thyroid nodule, measuring 1.4 cm.  IMPRESSION: 1. Atrophy and small vessel disease. 2.  No evidence for acute intracranial abnormality. 3. Significant soft tissue swelling involving the right side of the face extending from the right orbit inferiorly to the right aspect of the mandible. There is a moderate hematoma along the lateral wall of the right maxilla which measures 3.4 cm. Soft tissue swelling involves the preseptal soft tissues of the right orbit. The globe is intact. 4. Mild anterior subluxation of the right temporomandibular joint. No dislocation or fracture. 5. Chronic asymmetry of the left globe without acute injury. 6. Degenerative changes in the cervical spine. No evidence for acute cervical spine abnormality. 7. Incidental note of right thyroid nodule with benign features. No further evaluation is felt to be necessary based on consensus criteria. 8. Carotid calcifications.   Electronically Signed   By: Nolon Nations M.D.   On: 03/19/2015 16:36    Assessment:  1 AKI, suspect it was hemodynamically induced due to GI losses(likely), exacerbated by ARB and poss NSAID  Plan: 1 Supportive care 2 If renal function is not better in AM would place foley  Burnie Therien C 03/20/2015, 4:39 PM

## 2015-03-21 ENCOUNTER — Inpatient Hospital Stay (HOSPITAL_COMMUNITY): Payer: Medicare Other

## 2015-03-21 LAB — CREATININE, URINE, RANDOM: CREATININE, URINE: 41.27 mg/dL

## 2015-03-21 LAB — BASIC METABOLIC PANEL
ANION GAP: 11 (ref 5–15)
BUN: 54 mg/dL — ABNORMAL HIGH (ref 6–20)
CALCIUM: 8.1 mg/dL — AB (ref 8.9–10.3)
CO2: 20 mmol/L — ABNORMAL LOW (ref 22–32)
Chloride: 106 mmol/L (ref 101–111)
Creatinine, Ser: 2.81 mg/dL — ABNORMAL HIGH (ref 0.44–1.00)
GFR, EST AFRICAN AMERICAN: 17 mL/min — AB (ref >60)
GFR, EST NON AFRICAN AMERICAN: 15 mL/min — AB (ref >60)
Glucose, Bld: 151 mg/dL — ABNORMAL HIGH (ref 65–99)
Potassium: 2.9 mmol/L — ABNORMAL LOW (ref 3.5–5.1)
Sodium: 137 mmol/L (ref 135–145)

## 2015-03-21 LAB — CBC
HEMATOCRIT: 38.2 % (ref 36.0–46.0)
Hemoglobin: 13 g/dL (ref 12.0–15.0)
MCH: 32.8 pg (ref 26.0–34.0)
MCHC: 34 g/dL (ref 30.0–36.0)
MCV: 96.5 fL (ref 78.0–100.0)
PLATELETS: 245 10*3/uL (ref 150–400)
RBC: 3.96 MIL/uL (ref 3.87–5.11)
RDW: 12.6 % (ref 11.5–15.5)
WBC: 15.2 10*3/uL — AB (ref 4.0–10.5)

## 2015-03-21 LAB — MAGNESIUM: MAGNESIUM: 1.7 mg/dL (ref 1.7–2.4)

## 2015-03-21 LAB — SODIUM, URINE, RANDOM: Sodium, Ur: 114 mmol/L

## 2015-03-21 MED ORDER — DOCUSATE SODIUM 100 MG PO CAPS: 100.0000 mg | ORAL_CAPSULE | Freq: Every day | ORAL | Status: DC

## 2015-03-21 MED ORDER — ATENOLOL 50 MG PO TABS
50.0000 mg | ORAL_TABLET | Freq: Two times a day (BID) | ORAL | Status: DC
  Administered 2015-03-21 – 2015-03-22 (×3): 50 mg via ORAL
  Filled 2015-03-21 (×3): qty 1

## 2015-03-21 MED ORDER — POTASSIUM CHLORIDE CRYS ER 20 MEQ PO TBCR
40.0000 meq | EXTENDED_RELEASE_TABLET | Freq: Three times a day (TID) | ORAL | Status: AC
  Administered 2015-03-21 – 2015-03-22 (×5): 40 meq via ORAL
  Filled 2015-03-21 (×5): qty 2

## 2015-03-21 MED ORDER — SODIUM CHLORIDE 0.9 % IV BOLUS (SEPSIS)
500.0000 mL | Freq: Once | INTRAVENOUS | Status: AC
  Administered 2015-03-21: 500 mL via INTRAVENOUS

## 2015-03-21 MED ORDER — POTASSIUM CHLORIDE IN NACL 40-0.9 MEQ/L-% IV SOLN
INTRAVENOUS | Status: DC
  Administered 2015-03-21 – 2015-03-22 (×3): 75 mL/h via INTRAVENOUS
  Filled 2015-03-21 (×3): qty 1000

## 2015-03-21 NOTE — Clinical Social Work Placement (Signed)
Patient has a bed at [facility] when stable for discharge.     Raynaldo Opitz, [facility] Clinical Social Worker cell #: [phone]    CLINICAL SOCIAL WORK PLACEMENT  NOTE  Date:  03/21/2015  Patient Details  Name: JUEL RIPLEY MRN: [id number] Date of Birth: [birth year]  Clinical Social Work is seeking post-discharge placement for this patient at the [place] level of care (*CSW will initial, date and re-position this form in  chart as items are completed):  Yes   Patient/family provided with [facility] Work Department's list of facilities offering this level of care within the geographic area requested by the patient (or if unable, by the patient's family).  Yes   Patient/family informed of their freedom to choose among providers that offer the needed level of care, that participate in Medicare, Medicaid or managed care program needed by the patient, have an available bed and are willing to accept the patient.  Yes   Patient/family informed of [facility]'s ownership interest in [facility] and [facility], as well as of the fact that they are under no obligation to receive care at these facilities.  PASRR submitted to EDS on 03/21/15     PASRR number received on 03/21/15     Existing PASRR number confirmed on       FL2 transmitted to all facilities in geographic area requested by pt/family on 03/21/15     FL2 transmitted to all facilities within larger geographic area on       Patient informed that his/her managed care company has contracts with or will negotiate with certain facilities, including the following:        Yes   Patient/family informed of bed offers received.  Patient chooses bed at [place], [place]     Physician recommends and patient chooses bed at      Patient to be transferred to [facility] [place], [place] on  .  Patient to be transferred to facility by        Patient family notified on   of transfer.  Name of family member notified:        PHYSICIAN       Additional Comment:    _______________________________________________ Standley Brooking, LCSW 03/21/2015, 11:59 AM

## 2015-03-21 NOTE — Clinical Social Work Note (Signed)
Clinical Social Work Assessment  Patient Details  Name: Sandra [name] MRN: [id number] Date of Birth: 1928/08/03  Date of referral:  03/21/15               Reason for consult:  Facility Placement                Permission sought to share information with:  Chartered certified accountant granted to share information::  Yes, Verbal Permission Granted  Name::        Agency::     Relationship::     Contact Information:     Housing/Transportation Living arrangements for the past 2 months:  Single Family Home Source of Information:  Patient, Adult Children Patient Interpreter Needed:  None Criminal Activity/Legal Involvement Pertinent to Current Situation/Hospitalization:  No - Comment as needed Significant Relationships:  Adult Children Lives with:  Self Do you feel safe going back to the place where you live?  No Need for family participation in patient care:  Yes (Comment)  Care giving concerns:  CSW reviewed PT evaluation recommending SNF at discharge.    Social Worker assessment / plan:  CSW spoke with patient & daughter, Sandra [name] via phone re: discharge planning - they are agreeable with plan for SNF.   Employment status:  Retired Nurse, adult PT Recommendations:  [place] / Referral to community resources:  [place]  Patient/Family's Response to care:  Patient's daughter was pleased to hear that [facility] would be able to take patient at discharge.   Patient/Family's Understanding of and Emotional Response to Diagnosis, Current Treatment, and Prognosis:  Patient's daughter was asking about her kidney function, per MD notes - awaiting nephrologist visit.   Emotional Assessment Appearance:  Appears stated age Attitude/Demeanor/Rapport:    Affect (typically observed):  Quiet, Pleasant, Calm, Accepting Orientation:  Oriented to Self, Oriented to Place, Oriented to  Time Alcohol /  Substance use:    Psych involvement (Current and /or in the community):     Discharge Needs  Concerns to be addressed:    Readmission within the last 30 days:    Current discharge risk:    Barriers to Discharge:      Standley Brooking, LCSW 03/21/2015, 11:57 AM

## 2015-03-21 NOTE — Progress Notes (Signed)
PT Cancellation Note  Patient Details Name: Sandra [name] MRN: [id number] DOB: 1929-03-12   Cancelled Treatment:     C/M per RN, BP is high and pt has nausea.  Will check back as schedule permits.   Nathanial Rancher 03/21/2015, 2:33 PM

## 2015-03-21 NOTE — Progress Notes (Signed)
Assessment:  1 AKI, suspect it was hemodynamically induced due to GI losses(likely), exacerbated by ARB and poss NSAID-improving 2 Hypokalemia Plan:  K replacement in progress by Hospitalist and supportive care.  We will sign off.  No new issues Objective: Vital signs in last 24 hours: Temp:  [98.2 F (36.8 C)-99.1 F (37.3 C)] 99.1 F (37.3 C) (09/14 0415) Pulse Rate:  [63-68] 63 (09/14 0415) Resp:  [14-18] 16 (09/14 0415) BP: (155-201)/(57-72) 155/57 mmHg (09/14 0628) SpO2:  [94 %] 94 % (09/14 0415) Weight:  [61.553 kg (135 lb 11.2 oz)] 61.553 kg (135 lb 11.2 oz) (09/14 0415) Weight change: 1.253 kg (2 lb 12.2 oz)  Intake/Output from previous day: 09/13 0701 - 09/14 0700 In: 3170 [P.O.:220; I.V.:2950] Out: 357 [Urine:354; Stool:3] Intake/Output this shift:   General appearance: alert and cooperative Head: large bruise right face extending from orbit down cheek, eccyhmotic Eyes: swollen closed OD but can open, blind OS Resp: clear to auscultation bilaterally Chest wall: no tenderness Cardio: regular rate and rhythm, S1, S2 normal, no murmur, click, rub or gallop GI: soft, non-tender; bowel sounds normal; no masses, no organomegaly Extremities: extremities normal, atraumatic, no cyanosis or edema  Lab Results:  Recent Labs  03/20/15 0436 03/21/15 0515  WBC 15.0* 15.2*  HGB 13.1 13.0  HCT 38.7 38.2  PLT 265 245   BMET:  Recent Labs  03/20/15 0436 03/21/15 0515  NA 137 137  K 3.5 2.9*  CL 105 106  CO2 21* 20*  GLUCOSE 151* 151*  BUN 55* 54*  CREATININE 4.16* 2.81*  CALCIUM 8.2* 8.1*   No results for input(s): PTH in the last 72 hours. Iron Studies: No results for input(s): IRON, TIBC, TRANSFERRIN, FERRITIN in the last 72 hours. Studies/Results: Dg Chest 2 View  03/19/2015   CLINICAL DATA:  Fall sometime in the last 2 days. Left shoulder pain. Patient found with swelling of the face. Vomiting.  EXAM: CHEST  2 VIEW  COMPARISON:  03/09/2012  FINDINGS:  Atherosclerotic aortic arch. Heart size within normal limits. Stable prominence of the epicardial adipose tissue. No pneumothorax or pleural effusion.  Mild thoracic spondylosis.  IMPRESSION: 1. No acute thoracic findings. 2. Atherosclerotic aortic arch.   Electronically Signed   By: Van Clines M.D.   On: 03/19/2015 16:30   Dg Shoulder Right  03/19/2015   CLINICAL DATA:  Patient lives at home alone and fell some time over the past 2 days. Patient's family had been trying to call with no answer. When they arrived from out of town the patient was in bed with the right side of her face badly swollen and bruised. Blood was found all over the bathroom. Patient had fallen gotten into bed and was asleep when her family arrived. There was vomit found in the patient's bed. Patient is alert and oriented at this time, but has some loss of memory about the fall.  EXAM: RIGHT SHOULDER - 2+ VIEW  COMPARISON:  None.  FINDINGS: No fracture or dislocation. Mild AC joint osteoarthritis. Glenohumeral joint is relatively well maintained. Bones are demineralized. Soft tissues are unremarkable.  IMPRESSION: No fracture or dislocation.   Electronically Signed   By: Lajean Manes M.D.   On: 03/19/2015 19:47   Ct Head Wo Contrast  03/19/2015   CLINICAL DATA:  Patient lives at home alone and fell some time over the past 2 days. Patient's family had been trying to call with no answer. When they arrived from out of town the patient  was in bed with the right side of her face badly swollen and bruised. Blood was found all over the bathroom. Patient had fallen gotten into bed and was asleep when her family arrived. There was vomit found in the patient's bed. Patient is alert and oriented at this time, but has some loss of memory about the fall. Pt has hypertension.  EXAM: CT HEAD WITHOUT CONTRAST  CT MAXILLOFACIAL WITHOUT CONTRAST  CT CERVICAL SPINE WITHOUT CONTRAST  TECHNIQUE: Multidetector CT imaging of the head, cervical spine,  and maxillofacial structures were performed using the standard protocol without intravenous contrast. Multiplanar CT image reconstructions of the cervical spine and maxillofacial structures were also generated.  COMPARISON:  MRI and 02/08/2015  FINDINGS: CT HEAD FINDINGS  There is central and cortical atrophy. Periventricular white matter changes are consistent with small vessel disease. There is no intra or extra-axial fluid collection or mass lesion. The basilar cisterns and ventricles have a normal appearance. There is no CT evidence for acute infarction or hemorrhage. Bone windows show soft tissue edema superficial to the right zygomatic arch and right orbit. No calvarial fracture identified.  CT MAXILLOFACIAL FINDINGS  There is moderate preseptal edema of the right orbit. The right globe is intact. There is significant soft tissue swelling superficial to the right zygomatic arch. A hematoma is seen superficial to the lateral wall of the maxilla which measures 3.4 x 1.6 cm. Soft tissue swelling extends inferiorly to level of the mandible. There is mild anterior subluxation of the temporomandibular joint without frank subluxation or fracture.  There is chronic asymmetry of the left globe without evidence for acute injury.  The nasal bones, bony nasal septum, zygomatic arches, mandible and pterygoid plates are intact. No evidence for acute fracture of the sinus walls or orbits.  CT CERVICAL SPINE FINDINGS  There is significant degenerative disc disease of the cervical spine, with moderate narrowing and posterior osteophyte formation at C3-4, C5-6. There is no acute fracture or subluxation. Note is made of atherosclerotic calcification of the carotid arteries bilaterally. Incidental note is made of a right thyroid nodule, measuring 1.4 cm.  IMPRESSION: 1. Atrophy and small vessel disease. 2.  No evidence for acute intracranial abnormality. 3. Significant soft tissue swelling involving the right side of the face  extending from the right orbit inferiorly to the right aspect of the mandible. There is a moderate hematoma along the lateral wall of the right maxilla which measures 3.4 cm. Soft tissue swelling involves the preseptal soft tissues of the right orbit. The globe is intact. 4. Mild anterior subluxation of the right temporomandibular joint. No dislocation or fracture. 5. Chronic asymmetry of the left globe without acute injury. 6. Degenerative changes in the cervical spine. No evidence for acute cervical spine abnormality. 7. Incidental note of right thyroid nodule with benign features. No further evaluation is felt to be necessary based on consensus criteria. 8. Carotid calcifications.   Electronically Signed   By: Nolon Nations M.D.   On: 03/19/2015 16:36   Ct Cervical Spine Wo Contrast  03/19/2015   CLINICAL DATA:  Patient lives at home alone and fell some time over the past 2 days. Patient's family had been trying to call with no answer. When they arrived from out of town the patient was in bed with the right side of her face badly swollen and bruised. Blood was found all over the bathroom. Patient had fallen gotten into bed and was asleep when her family arrived. There was vomit found  in the patient's bed. Patient is alert and oriented at this time, but has some loss of memory about the fall. Pt has hypertension.  EXAM: CT HEAD WITHOUT CONTRAST  CT MAXILLOFACIAL WITHOUT CONTRAST  CT CERVICAL SPINE WITHOUT CONTRAST  TECHNIQUE: Multidetector CT imaging of the head, cervical spine, and maxillofacial structures were performed using the standard protocol without intravenous contrast. Multiplanar CT image reconstructions of the cervical spine and maxillofacial structures were also generated.  COMPARISON:  MRI and 02/08/2015  FINDINGS: CT HEAD FINDINGS  There is central and cortical atrophy. Periventricular white matter changes are consistent with small vessel disease. There is no intra or extra-axial fluid  collection or mass lesion. The basilar cisterns and ventricles have a normal appearance. There is no CT evidence for acute infarction or hemorrhage. Bone windows show soft tissue edema superficial to the right zygomatic arch and right orbit. No calvarial fracture identified.  CT MAXILLOFACIAL FINDINGS  There is moderate preseptal edema of the right orbit. The right globe is intact. There is significant soft tissue swelling superficial to the right zygomatic arch. A hematoma is seen superficial to the lateral wall of the maxilla which measures 3.4 x 1.6 cm. Soft tissue swelling extends inferiorly to level of the mandible. There is mild anterior subluxation of the temporomandibular joint without frank subluxation or fracture.  There is chronic asymmetry of the left globe without evidence for acute injury.  The nasal bones, bony nasal septum, zygomatic arches, mandible and pterygoid plates are intact. No evidence for acute fracture of the sinus walls or orbits.  CT CERVICAL SPINE FINDINGS  There is significant degenerative disc disease of the cervical spine, with moderate narrowing and posterior osteophyte formation at C3-4, C5-6. There is no acute fracture or subluxation. Note is made of atherosclerotic calcification of the carotid arteries bilaterally. Incidental note is made of a right thyroid nodule, measuring 1.4 cm.  IMPRESSION: 1. Atrophy and small vessel disease. 2.  No evidence for acute intracranial abnormality. 3. Significant soft tissue swelling involving the right side of the face extending from the right orbit inferiorly to the right aspect of the mandible. There is a moderate hematoma along the lateral wall of the right maxilla which measures 3.4 cm. Soft tissue swelling involves the preseptal soft tissues of the right orbit. The globe is intact. 4. Mild anterior subluxation of the right temporomandibular joint. No dislocation or fracture. 5. Chronic asymmetry of the left globe without acute injury. 6.  Degenerative changes in the cervical spine. No evidence for acute cervical spine abnormality. 7. Incidental note of right thyroid nodule with benign features. No further evaluation is felt to be necessary based on consensus criteria. 8. Carotid calcifications.   Electronically Signed   By: Nolon Nations M.D.   On: 03/19/2015 16:36   US Renal  03/20/2015   CLINICAL DATA:  Acute kidney insufficiency.  EXAM: RENAL / URINARY TRACT ULTRASOUND COMPLETE  COMPARISON:  None.  FINDINGS: Right Kidney:  Length: 11.5 cm. Echogenicity within normal limits. No mass or hydronephrosis visualized.  Left Kidney:  Length: 11.4 cm. Echogenicity within normal limits. In the lower pole left kidney, there is a 1.3 x 1.1 x 1.4 cm cyst with peripheral calcifications. No hydronephrosis visualized.  Bladder:  Appears normal for degree of bladder distention.  IMPRESSION: No acute abnormality.  1.4 cm cyst in lower pole left kidney with peripheral calcifications.   Electronically Signed   By: Abelardo Diesel M.D.   On: 03/20/2015 10:34   Dg Chest  Port 1 View  03/21/2015   CLINICAL DATA:  Fall.  Possible fever.  EXAM: PORTABLE CHEST - 1 VIEW  COMPARISON:  None.  FINDINGS: Mediastinum and hilar structures normal. Mild left base subsegmental atelectasis. No pleural effusion or pneumothorax. Heart size normal. No acute bony abnormality .  IMPRESSION: Mild left base subsegmental atelectasis.  Otherwise negative chest.   Electronically Signed   By: Marcello Moores  Register   On: 03/21/2015 11:56   Dg Shoulder Left  03/19/2015   CLINICAL DATA:  Fall recently with left shoulder pain. Initial encounter.  EXAM: LEFT SHOULDER - 2+ VIEW  COMPARISON:  None.  FINDINGS: No acute fracture or dislocation is identified. There are moderate degenerative changes involving the glenohumeral joint and AC joint. Cystic changes are seen in the lateral humeral head. No bony destruction identified. Soft tissues are unremarkable.  IMPRESSION: No acute fracture. Moderate  degenerative changes present involving the glenohumeral joint and AC joint.   Electronically Signed   By: Aletta Edouard M.D.   On: 03/19/2015 16:34   Ct Maxillofacial Wo Cm  03/19/2015   CLINICAL DATA:  Patient lives at home alone and fell some time over the past 2 days. Patient's family had been trying to call with no answer. When they arrived from out of town the patient was in bed with the right side of her face badly swollen and bruised. Blood was found all over the bathroom. Patient had fallen gotten into bed and was asleep when her family arrived. There was vomit found in the patient's bed. Patient is alert and oriented at this time, but has some loss of memory about the fall. Pt has hypertension.  EXAM: CT HEAD WITHOUT CONTRAST  CT MAXILLOFACIAL WITHOUT CONTRAST  CT CERVICAL SPINE WITHOUT CONTRAST  TECHNIQUE: Multidetector CT imaging of the head, cervical spine, and maxillofacial structures were performed using the standard protocol without intravenous contrast. Multiplanar CT image reconstructions of the cervical spine and maxillofacial structures were also generated.  COMPARISON:  MRI and 02/08/2015  FINDINGS: CT HEAD FINDINGS  There is central and cortical atrophy. Periventricular white matter changes are consistent with small vessel disease. There is no intra or extra-axial fluid collection or mass lesion. The basilar cisterns and ventricles have a normal appearance. There is no CT evidence for acute infarction or hemorrhage. Bone windows show soft tissue edema superficial to the right zygomatic arch and right orbit. No calvarial fracture identified.  CT MAXILLOFACIAL FINDINGS  There is moderate preseptal edema of the right orbit. The right globe is intact. There is significant soft tissue swelling superficial to the right zygomatic arch. A hematoma is seen superficial to the lateral wall of the maxilla which measures 3.4 x 1.6 cm. Soft tissue swelling extends inferiorly to level of the mandible.  There is mild anterior subluxation of the temporomandibular joint without frank subluxation or fracture.  There is chronic asymmetry of the left globe without evidence for acute injury.  The nasal bones, bony nasal septum, zygomatic arches, mandible and pterygoid plates are intact. No evidence for acute fracture of the sinus walls or orbits.  CT CERVICAL SPINE FINDINGS  There is significant degenerative disc disease of the cervical spine, with moderate narrowing and posterior osteophyte formation at C3-4, C5-6. There is no acute fracture or subluxation. Note is made of atherosclerotic calcification of the carotid arteries bilaterally. Incidental note is made of a right thyroid nodule, measuring 1.4 cm.  IMPRESSION: 1. Atrophy and small vessel disease. 2.  No evidence for acute  intracranial abnormality. 3. Significant soft tissue swelling involving the right side of the face extending from the right orbit inferiorly to the right aspect of the mandible. There is a moderate hematoma along the lateral wall of the right maxilla which measures 3.4 cm. Soft tissue swelling involves the preseptal soft tissues of the right orbit. The globe is intact. 4. Mild anterior subluxation of the right temporomandibular joint. No dislocation or fracture. 5. Chronic asymmetry of the left globe without acute injury. 6. Degenerative changes in the cervical spine. No evidence for acute cervical spine abnormality. 7. Incidental note of right thyroid nodule with benign features. No further evaluation is felt to be necessary based on consensus criteria. 8. Carotid calcifications.   Electronically Signed   By: Nolon Nations M.D.   On: 03/19/2015 16:36    Scheduled: . amLODipine  10 mg Oral Daily  . atenolol  50 mg Oral BID  . lubiprostone  24 mcg Oral BID WC  . potassium chloride  40 mEq Oral TID  . sodium chloride  3 mL Intravenous Q12H     LOS: 2 days   Sandra [name] C 03/21/2015,1:03 PM

## 2015-03-21 NOTE — Care Management Important Message (Signed)
Important Message  Patient Details  Name: Sandra [name] MRN: [id number] Date of Birth: 1928-11-29   Medicare Important Message Given:  Yes-second notification given    Camillo Flaming 03/21/2015, 11:39 AMImportant Message  Patient Details  Name: Sandra [name] MRN: [id number] Date of Birth: [birth year]   Medicare Important Message Given:  Yes-second notification given    Camillo Flaming 03/21/2015, 11:39 AM

## 2015-03-21 NOTE — Progress Notes (Addendum)
PROGRESS NOTE    Sandra ALAIMO INO:[id number] DOB: Mar 13, 1929 DOA: 03/19/2015 PCP: Walker Kehr, MD  HPI/Brief narrative 81 y.o. 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 In the ED, evaluation revealed mildly elevated blood pressures, low-grade temperature, creatinine 4.3, WBC 16.7, negative troponin and CK 1 and extensive imaging with chest x-ray, CT head, CT cervical spine, x-ray left shoulder, CT maxillofacial only significant for mild anterior subluxation of right TMJ. Hospitalist admission was requested.   Assessment/Plan:  Acute kidney injury - Creatinine normal at 0.95 on 01/30/15. Admitted with creatinine of 4.3, now 2.81, improving - Multifactorial secondary to dehydration from GI losses, poor oral intake and meds (ARB +/- NSAIDs) - Hold culprit medications , continue IV normal saline hydration, strict intake and output charting and follow daily BMP. - Renal ultrasound without hydronephrosis Nephrology consulted 9/13.  Hypokalemia-replete   Fall and right facial trauma and other superficial trauma/ataxia/peripheral neuropathy - History of gait ataxia for a few months (sees outpatient [place] neurology) and falls at home - No significant fractures or dislocation on imaging studies - Symptomatic treatment - PT and OT evaluation. Will SNF at discharge - Her falls may be multifactorial related to decreased proprioception (blind left eye, peripheral neuropathy), advanced age and weakness  Nausea, vomiting and diarrhea - DD acute viral GE versus rule out C. Difficile C. difficile PCR: Negative, patient complains of constipation now - Supportive treatment for now and hold off on antibiotics - Seems to have  resolved. As per daughter, may have chronic intermittent symptoms related to abdominal discomfort/dyspepsia and outpatient GI workup/colonoscopy being contemplated.  Dehydration - Continue IV fluids  Essential hypertension - Patient on several medications at home and it is not entirely clear which one of those she actually takes. Listed are atenolol, ARB and clonidine. -Increase Norvasc to 10 MG daily and when necessary IV hydralazine Restart atenolol  HLD - Possibly on statins at home.  Mild anterior subluxation of right TMJ - Asymptomatic at this time. Monitor  Right thyroid nodule - Seen on CT and reported with benign features and radiologist recommends no further evaluation based on consensus criteria   DVT prophylaxis: SCDs  Code Status: Full  Family Communication:Discussed with daughter at bedside. Disposition Plan: Possibly SNF anticipate discharge in 2-3 days.    Consultants:  Nephrology  Procedures:  None  Antibiotics:  None  Subjective: Sleepy and would like to sleep and not be disturbed., Patient complaining of some nausea, constipated  Objective: Filed Vitals:   03/20/15 1416 03/20/15 2043 03/21/15 0415 03/21/15 0628  BP: 157/63 175/63 201/72 155/57  Pulse: 68 68 63   Temp: 98.2 F (36.8 C) 98.4 F (36.9 C) 99.1 F (37.3 C)   TempSrc: Oral Oral Oral   Resp: 18 14 16    Height:      Weight:   61.553 kg (135 lb 11.2 oz)   SpO2: 94% 94% 94%     Intake/Output Summary (Last 24 hours) at 03/21/15 1138 Last data filed at 03/21/15 0962  Gross per 24 hour  Intake   3170 ml  Output    355 ml  Net   2815 ml   Filed Weights   03/19/15 2000 03/20/15 0500 03/21/15 0415  Weight: 60.3 kg (132 lb  15 oz) 60.3 kg (132 lb 15 oz) 61.553 kg (135 lb 11.2 oz)     Exam:  General exam: Pleasant elderly female lying comfortably in bed. Extensive right facial bruising and infraorbital hematoma. (Picture in H&P) Respiratory system: Clear. No increased work  of breathing. Cardiovascular system: S1 & S2 heard, RRR. No JVD, murmurs, gallops, clicks or pedal edema. Gastrointestinal system: Abdomen is nondistended, soft and nontender. Normal bowel sounds heard. Central nervous system: Alert and oriented. No focal neurological deficits. Extremities: Symmetric 5 x 5 power.   Data Reviewed: Basic Metabolic Panel:  Recent Labs Lab 03/19/15 1523 03/20/15 0436 03/21/15 0515  NA 138 137 137  K 3.7 3.5 2.9*  CL 95* 105 106  CO2 27 21* 20*  GLUCOSE 170* 151* 151*  BUN 49* 55* 54*  CREATININE 4.30* 4.16* 2.81*  CALCIUM 9.2 8.2* 8.1*  MG  --   --  1.7   Liver Function Tests:  Recent Labs Lab 03/20/15 0436  AST 22  ALT 13*  ALKPHOS 46  BILITOT 0.5  PROT 5.7*  ALBUMIN 3.5   No results for input(s): LIPASE, AMYLASE in the last 168 hours. No results for input(s): AMMONIA in the last 168 hours. CBC:  Recent Labs Lab 03/19/15 1523 03/20/15 0436 03/21/15 0515  WBC 16.7* 15.0* 15.2*  NEUTROABS 13.6*  --   --   HGB 14.2 13.1 13.0  HCT 40.7 38.7 38.2  MCV 95.1 96.8 96.5  PLT 283 265 245   Cardiac Enzymes:  Recent Labs Lab 03/19/15 1523  CKTOTAL 127  TROPONINI <0.03   BNP (last 3 results) No results for input(s): PROBNP in the last 8760 hours. CBG: No results for input(s): GLUCAP in the last 168 hours.  Recent Results (from the past 240 hour(s))  Urine culture     Status: None   Collection Time: 03/19/15  4:00 PM  Result Value Ref Range Status   Specimen Description URINE, CATHETERIZED  Final   Special Requests NONE  Final   Culture   Final    NO GROWTH 1 DAY Performed at [facility]    Report Status 03/20/2015 FINAL  Final  C difficile quick scan w PCR reflex     Status: None   Collection Time: 03/20/15  2:04 AM  Result Value Ref Range Status   C Diff antigen NEGATIVE NEGATIVE Final   C Diff toxin NEGATIVE NEGATIVE Final   C Diff interpretation Negative for toxigenic C. difficile  Final            Studies: Dg Chest 2 View  03/19/2015   CLINICAL DATA:  Fall sometime in the last 2 days. Left shoulder pain. Patient found with swelling of the face. Vomiting.  EXAM: CHEST  2 VIEW  COMPARISON:  03/09/2012  FINDINGS: Atherosclerotic aortic arch. Heart size within normal limits. Stable prominence of the epicardial adipose tissue. No pneumothorax or pleural effusion.  Mild thoracic spondylosis.  IMPRESSION: 1. No acute thoracic findings. 2. Atherosclerotic aortic arch.   Electronically Signed   By: Van Clines M.D.   On: 03/19/2015 16:30   Dg Shoulder Right  03/19/2015   CLINICAL DATA:  Patient lives at home alone and fell some time over the past 2 days. Patient's family had been trying to call with no answer. When they arrived from out of town the patient was in bed with the right side of her face badly swollen and bruised. Blood was found all over the bathroom. Patient had fallen gotten  into bed and was asleep when her family arrived. There was vomit found in the patient's bed. Patient is alert and oriented at this time, but has some loss of memory about the fall.  EXAM: RIGHT SHOULDER - 2+ VIEW  COMPARISON:  None.  FINDINGS: No fracture or dislocation. Mild AC joint osteoarthritis. Glenohumeral joint is relatively well maintained. Bones are demineralized. Soft tissues are unremarkable.  IMPRESSION: No fracture or dislocation.   Electronically Signed   By: Lajean Manes M.D.   On: 03/19/2015 19:47   Ct Head Wo Contrast  03/19/2015   CLINICAL DATA:  Patient lives at home alone and fell some time over the past 2 days. Patient's family had been trying to call with no answer. When they arrived from out of town the patient was in bed with the right side of her face badly swollen and bruised. Blood was found all over the bathroom. Patient had fallen gotten into bed and was asleep when her family arrived. There was vomit found in the patient's bed. Patient is alert and oriented at this time,  but has some loss of memory about the fall. Pt has hypertension.  EXAM: CT HEAD WITHOUT CONTRAST  CT MAXILLOFACIAL WITHOUT CONTRAST  CT CERVICAL SPINE WITHOUT CONTRAST  TECHNIQUE: Multidetector CT imaging of the head, cervical spine, and maxillofacial structures were performed using the standard protocol without intravenous contrast. Multiplanar CT image reconstructions of the cervical spine and maxillofacial structures were also generated.  COMPARISON:  MRI and 02/08/2015  FINDINGS: CT HEAD FINDINGS  There is central and cortical atrophy. Periventricular white matter changes are consistent with small vessel disease. There is no intra or extra-axial fluid collection or mass lesion. The basilar cisterns and ventricles have a normal appearance. There is no CT evidence for acute infarction or hemorrhage. Bone windows show soft tissue edema superficial to the right zygomatic arch and right orbit. No calvarial fracture identified.  CT MAXILLOFACIAL FINDINGS  There is moderate preseptal edema of the right orbit. The right globe is intact. There is significant soft tissue swelling superficial to the right zygomatic arch. A hematoma is seen superficial to the lateral wall of the maxilla which measures 3.4 x 1.6 cm. Soft tissue swelling extends inferiorly to level of the mandible. There is mild anterior subluxation of the temporomandibular joint without frank subluxation or fracture.  There is chronic asymmetry of the left globe without evidence for acute injury.  The nasal bones, bony nasal septum, zygomatic arches, mandible and pterygoid plates are intact. No evidence for acute fracture of the sinus walls or orbits.  CT CERVICAL SPINE FINDINGS  There is significant degenerative disc disease of the cervical spine, with moderate narrowing and posterior osteophyte formation at C3-4, C5-6. There is no acute fracture or subluxation. Note is made of atherosclerotic calcification of the carotid arteries bilaterally. Incidental  note is made of a right thyroid nodule, measuring 1.4 cm.  IMPRESSION: 1. Atrophy and small vessel disease. 2.  No evidence for acute intracranial abnormality. 3. Significant soft tissue swelling involving the right side of the face extending from the right orbit inferiorly to the right aspect of the mandible. There is a moderate hematoma along the lateral wall of the right maxilla which measures 3.4 cm. Soft tissue swelling involves the preseptal soft tissues of the right orbit. The globe is intact. 4. Mild anterior subluxation of the right temporomandibular joint. No dislocation or fracture. 5. Chronic asymmetry of the left globe without acute injury. 6. Degenerative changes in  the cervical spine. No evidence for acute cervical spine abnormality. 7. Incidental note of right thyroid nodule with benign features. No further evaluation is felt to be necessary based on consensus criteria. 8. Carotid calcifications.   Electronically Signed   By: Nolon Nations M.D.   On: 03/19/2015 16:36   Ct Cervical Spine Wo Contrast  03/19/2015   CLINICAL DATA:  Patient lives at home alone and fell some time over the past 2 days. Patient's family had been trying to call with no answer. When they arrived from out of town the patient was in bed with the right side of her face badly swollen and bruised. Blood was found all over the bathroom. Patient had fallen gotten into bed and was asleep when her family arrived. There was vomit found in the patient's bed. Patient is alert and oriented at this time, but has some loss of memory about the fall. Pt has hypertension.  EXAM: CT HEAD WITHOUT CONTRAST  CT MAXILLOFACIAL WITHOUT CONTRAST  CT CERVICAL SPINE WITHOUT CONTRAST  TECHNIQUE: Multidetector CT imaging of the head, cervical spine, and maxillofacial structures were performed using the standard protocol without intravenous contrast. Multiplanar CT image reconstructions of the cervical spine and maxillofacial structures were also  generated.  COMPARISON:  MRI and 02/08/2015  FINDINGS: CT HEAD FINDINGS  There is central and cortical atrophy. Periventricular white matter changes are consistent with small vessel disease. There is no intra or extra-axial fluid collection or mass lesion. The basilar cisterns and ventricles have a normal appearance. There is no CT evidence for acute infarction or hemorrhage. Bone windows show soft tissue edema superficial to the right zygomatic arch and right orbit. No calvarial fracture identified.  CT MAXILLOFACIAL FINDINGS  There is moderate preseptal edema of the right orbit. The right globe is intact. There is significant soft tissue swelling superficial to the right zygomatic arch. A hematoma is seen superficial to the lateral wall of the maxilla which measures 3.4 x 1.6 cm. Soft tissue swelling extends inferiorly to level of the mandible. There is mild anterior subluxation of the temporomandibular joint without frank subluxation or fracture.  There is chronic asymmetry of the left globe without evidence for acute injury.  The nasal bones, bony nasal septum, zygomatic arches, mandible and pterygoid plates are intact. No evidence for acute fracture of the sinus walls or orbits.  CT CERVICAL SPINE FINDINGS  There is significant degenerative disc disease of the cervical spine, with moderate narrowing and posterior osteophyte formation at C3-4, C5-6. There is no acute fracture or subluxation. Note is made of atherosclerotic calcification of the carotid arteries bilaterally. Incidental note is made of a right thyroid nodule, measuring 1.4 cm.  IMPRESSION: 1. Atrophy and small vessel disease. 2.  No evidence for acute intracranial abnormality. 3. Significant soft tissue swelling involving the right side of the face extending from the right orbit inferiorly to the right aspect of the mandible. There is a moderate hematoma along the lateral wall of the right maxilla which measures 3.4 cm. Soft tissue swelling  involves the preseptal soft tissues of the right orbit. The globe is intact. 4. Mild anterior subluxation of the right temporomandibular joint. No dislocation or fracture. 5. Chronic asymmetry of the left globe without acute injury. 6. Degenerative changes in the cervical spine. No evidence for acute cervical spine abnormality. 7. Incidental note of right thyroid nodule with benign features. No further evaluation is felt to be necessary based on consensus criteria. 8. Carotid calcifications.   Electronically  Signed   By: Nolon Nations M.D.   On: 03/19/2015 16:36   US Renal  03/20/2015   CLINICAL DATA:  Acute kidney insufficiency.  EXAM: RENAL / URINARY TRACT ULTRASOUND COMPLETE  COMPARISON:  None.  FINDINGS: Right Kidney:  Length: 11.5 cm. Echogenicity within normal limits. No mass or hydronephrosis visualized.  Left Kidney:  Length: 11.4 cm. Echogenicity within normal limits. In the lower pole left kidney, there is a 1.3 x 1.1 x 1.4 cm cyst with peripheral calcifications. No hydronephrosis visualized.  Bladder:  Appears normal for degree of bladder distention.  IMPRESSION: No acute abnormality.  1.4 cm cyst in lower pole left kidney with peripheral calcifications.   Electronically Signed   By: Abelardo Diesel M.D.   On: 03/20/2015 10:34   Dg Shoulder Left  03/19/2015   CLINICAL DATA:  Fall recently with left shoulder pain. Initial encounter.  EXAM: LEFT SHOULDER - 2+ VIEW  COMPARISON:  None.  FINDINGS: No acute fracture or dislocation is identified. There are moderate degenerative changes involving the glenohumeral joint and AC joint. Cystic changes are seen in the lateral humeral head. No bony destruction identified. Soft tissues are unremarkable.  IMPRESSION: No acute fracture. Moderate degenerative changes present involving the glenohumeral joint and AC joint.   Electronically Signed   By: Aletta Edouard M.D.   On: 03/19/2015 16:34   Ct Maxillofacial Wo Cm  03/19/2015   CLINICAL DATA:  Patient lives  at home alone and fell some time over the past 2 days. Patient's family had been trying to call with no answer. When they arrived from out of town the patient was in bed with the right side of her face badly swollen and bruised. Blood was found all over the bathroom. Patient had fallen gotten into bed and was asleep when her family arrived. There was vomit found in the patient's bed. Patient is alert and oriented at this time, but has some loss of memory about the fall. Pt has hypertension.  EXAM: CT HEAD WITHOUT CONTRAST  CT MAXILLOFACIAL WITHOUT CONTRAST  CT CERVICAL SPINE WITHOUT CONTRAST  TECHNIQUE: Multidetector CT imaging of the head, cervical spine, and maxillofacial structures were performed using the standard protocol without intravenous contrast. Multiplanar CT image reconstructions of the cervical spine and maxillofacial structures were also generated.  COMPARISON:  MRI and 02/08/2015  FINDINGS: CT HEAD FINDINGS  There is central and cortical atrophy. Periventricular white matter changes are consistent with small vessel disease. There is no intra or extra-axial fluid collection or mass lesion. The basilar cisterns and ventricles have a normal appearance. There is no CT evidence for acute infarction or hemorrhage. Bone windows show soft tissue edema superficial to the right zygomatic arch and right orbit. No calvarial fracture identified.  CT MAXILLOFACIAL FINDINGS  There is moderate preseptal edema of the right orbit. The right globe is intact. There is significant soft tissue swelling superficial to the right zygomatic arch. A hematoma is seen superficial to the lateral wall of the maxilla which measures 3.4 x 1.6 cm. Soft tissue swelling extends inferiorly to level of the mandible. There is mild anterior subluxation of the temporomandibular joint without frank subluxation or fracture.  There is chronic asymmetry of the left globe without evidence for acute injury.  The nasal bones, bony nasal septum,  zygomatic arches, mandible and pterygoid plates are intact. No evidence for acute fracture of the sinus walls or orbits.  CT CERVICAL SPINE FINDINGS  There is significant degenerative disc disease of the  cervical spine, with moderate narrowing and posterior osteophyte formation at C3-4, C5-6. There is no acute fracture or subluxation. Note is made of atherosclerotic calcification of the carotid arteries bilaterally. Incidental note is made of a right thyroid nodule, measuring 1.4 cm.  IMPRESSION: 1. Atrophy and small vessel disease. 2.  No evidence for acute intracranial abnormality. 3. Significant soft tissue swelling involving the right side of the face extending from the right orbit inferiorly to the right aspect of the mandible. There is a moderate hematoma along the lateral wall of the right maxilla which measures 3.4 cm. Soft tissue swelling involves the preseptal soft tissues of the right orbit. The globe is intact. 4. Mild anterior subluxation of the right temporomandibular joint. No dislocation or fracture. 5. Chronic asymmetry of the left globe without acute injury. 6. Degenerative changes in the cervical spine. No evidence for acute cervical spine abnormality. 7. Incidental note of right thyroid nodule with benign features. No further evaluation is felt to be necessary based on consensus criteria. 8. Carotid calcifications.   Electronically Signed   By: Nolon Nations M.D.   On: 03/19/2015 16:36        Scheduled Meds: . amLODipine  10 mg Oral Daily  . lubiprostone  24 mcg Oral BID WC  . potassium chloride  40 mEq Oral TID  . sodium chloride  3 mL Intravenous Q12H   Continuous Infusions:    Principal Problem:   AKI (acute kidney injury) Active Problems:   Dyslipidemia   Essential hypertension   Ataxia   Peripheral neuropathy   Fall at home   Facial trauma-right side   Nausea vomiting and diarrhea   Dehydration   Acute kidney injury    Time spent: 32  minutes    Reyne Dumas, MD,  [facility] Pager [phone]  If 7PM-7AM, please contact night-coverage [email or web address] Password [email or web address] 03/21/2015, 11:38 AM    LOS: 2 days

## 2015-03-22 ENCOUNTER — Encounter (HOSPITAL_COMMUNITY): Payer: Self-pay | Admitting: Radiology

## 2015-03-22 ENCOUNTER — Inpatient Hospital Stay (HOSPITAL_COMMUNITY): Payer: Medicare Other

## 2015-03-22 DIAGNOSIS — N179 Acute kidney failure, unspecified: Principal | ICD-10-CM

## 2015-03-22 LAB — CBC
HEMATOCRIT: 38.2 % (ref 36.0–46.0)
HEMOGLOBIN: 13.2 g/dL (ref 12.0–15.0)
MCH: 32.9 pg (ref 26.0–34.0)
MCHC: 34.6 g/dL (ref 30.0–36.0)
MCV: 95.3 fL (ref 78.0–100.0)
Platelets: 274 10*3/uL (ref 150–400)
RBC: 4.01 MIL/uL (ref 3.87–5.11)
RDW: 12.7 % (ref 11.5–15.5)
WBC: 18.4 10*3/uL — AB (ref 4.0–10.5)

## 2015-03-22 LAB — COMPREHENSIVE METABOLIC PANEL
ALT: 27 U/L (ref 14–54)
ANION GAP: 7 (ref 5–15)
AST: 31 U/L (ref 15–41)
Albumin: 3.4 g/dL — ABNORMAL LOW (ref 3.5–5.0)
Alkaline Phosphatase: 48 U/L (ref 38–126)
BUN: 40 mg/dL — ABNORMAL HIGH (ref 6–20)
CHLORIDE: 107 mmol/L (ref 101–111)
CO2: 17 mmol/L — ABNORMAL LOW (ref 22–32)
Calcium: 7.9 mg/dL — ABNORMAL LOW (ref 8.9–10.3)
Creatinine, Ser: 1.68 mg/dL — ABNORMAL HIGH (ref 0.44–1.00)
GFR, EST AFRICAN AMERICAN: 31 mL/min — AB (ref >60)
GFR, EST NON AFRICAN AMERICAN: 27 mL/min — AB (ref >60)
Glucose, Bld: 178 mg/dL — ABNORMAL HIGH (ref 65–99)
POTASSIUM: 4.5 mmol/L (ref 3.5–5.1)
Sodium: 131 mmol/L — ABNORMAL LOW (ref 135–145)
Total Bilirubin: 0.4 mg/dL (ref 0.3–1.2)
Total Protein: 5.7 g/dL — ABNORMAL LOW (ref 6.5–8.1)

## 2015-03-22 LAB — TROPONIN I: Troponin I: 0.09 ng/mL — ABNORMAL HIGH (ref <0.031)

## 2015-03-22 MED ORDER — SIMVASTATIN 40 MG PO TABS: 40.0000 mg | ORAL_TABLET | Freq: Every day | ORAL | Status: DC

## 2015-03-22 MED ORDER — ATENOLOL 50 MG PO TABS
100.0000 mg | ORAL_TABLET | Freq: Two times a day (BID) | ORAL | Status: DC
  Administered 2015-03-22 – 2015-03-24 (×4): 100 mg via ORAL
  Filled 2015-03-22 (×6): qty 2

## 2015-03-22 MED ORDER — CLONIDINE HCL 0.1 MG PO TABS
0.1000 mg | ORAL_TABLET | Freq: Two times a day (BID) | ORAL | Status: DC
  Administered 2015-03-22 – 2015-03-24 (×5): 0.1 mg via ORAL
  Filled 2015-03-22 (×5): qty 1

## 2015-03-22 MED ORDER — ATENOLOL 50 MG PO TABS
50.0000 mg | ORAL_TABLET | Freq: Once | ORAL | Status: AC
  Administered 2015-03-22: 50 mg via ORAL
  Filled 2015-03-22: qty 1

## 2015-03-22 MED ORDER — ATORVASTATIN CALCIUM 10 MG PO TABS
20.0000 mg | ORAL_TABLET | Freq: Every day | ORAL | Status: DC
  Administered 2015-03-22 – 2015-03-23 (×2): 20 mg via ORAL
  Filled 2015-03-22 (×2): qty 2

## 2015-03-22 MED ORDER — GABAPENTIN 100 MG PO CAPS
100.0000 mg | ORAL_CAPSULE | Freq: Three times a day (TID) | ORAL | Status: DC
  Administered 2015-03-22 – 2015-03-24 (×6): 100 mg via ORAL
  Filled 2015-03-22 (×6): qty 1

## 2015-03-22 MED ORDER — QUETIAPINE FUMARATE 100 MG PO TABS
100.0000 mg | ORAL_TABLET | Freq: Every evening | ORAL | Status: DC | PRN
  Administered 2015-03-24: 100 mg via ORAL
  Filled 2015-03-22: qty 1

## 2015-03-22 MED ORDER — CLOPIDOGREL BISULFATE 75 MG PO TABS
75.0000 mg | ORAL_TABLET | Freq: Every day | ORAL | Status: DC
  Administered 2015-03-22 – 2015-03-24 (×3): 75 mg via ORAL
  Filled 2015-03-22 (×3): qty 1

## 2015-03-22 MED ORDER — TRAMADOL HCL 50 MG PO TABS: 50.0000 mg | ORAL_TABLET | Freq: Two times a day (BID) | ORAL | Status: DC | PRN

## 2015-03-22 MED ORDER — SODIUM CHLORIDE 0.9 % IV SOLN
INTRAVENOUS | Status: AC
  Administered 2015-03-22: 14:00:00 via INTRAVENOUS

## 2015-03-22 MED ORDER — OXYCODONE HCL 5 MG PO TABS: 5.0000 mg | ORAL_TABLET | Freq: Four times a day (QID) | ORAL | Status: DC | PRN

## 2015-03-22 NOTE — Progress Notes (Signed)
PT Cancellation Note  Patient Details Name: Sandra [name] MRN: [id number] DOB: Nov 07, 1928   Cancelled Treatment:     C/M per RN, BP still high, pt still c/o nausea and awaiting lab work/levels.   Nathanial Rancher 03/22/2015, 12:12 PM

## 2015-03-22 NOTE — Progress Notes (Signed)
Patient c/o dizziness for the past two days. BP elevated at 176/64. NP on call notified. New orders placed. Orthostatics completed and charted in epic. Neuro checks WNL. Will continue to monitor closely.

## 2015-03-22 NOTE — Progress Notes (Signed)
  Echocardiogram 2D Echocardiogram has been performed.  Sandra [name] 03/22/2015, 4:38 PM

## 2015-03-22 NOTE — Progress Notes (Signed)
OT Cancellation Note  Patient Details Name: Sandra [name] MRN: [id number] DOB: 03-16-1929   Cancelled Treatment:    Reason Eval/Treat Not Completed: Other (comment) per nursing, pt's BP elevated this am and pt with nausea. Nursing requests OT check back later today. Will reattempt as schedule allows or next date.  [place], [place] 03/22/2015, 10:11 AM

## 2015-03-22 NOTE — Progress Notes (Addendum)
PROGRESS NOTE    Sandra [name] NWG:[id number] DOB: 11-09-1928 DOA: 03/19/2015 PCP: Walker Kehr, MD  HPI/Brief narrative 81 y.o. 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 In the ED, evaluation revealed mildly elevated blood pressures, low-grade temperature, creatinine 4.3, WBC 16.7, negative troponin and CK 1 and extensive imaging with chest x-ray, CT head, CT cervical spine, x-ray left shoulder, CT maxillofacial only significant for mild anterior subluxation of right TMJ. Hospitalist admission was requested.  Subjective-continues to have nausea and diarrhea, hypertensive overnight, also orthostatic yesterday evening  Assessment/Plan:  Acute kidney injury-prerenal - Baseline creatinine 0.95 on 01/30/15. Admitted with creatinine of 4.3, now 1.68, improving - Multifactorial secondary to dehydration from GI losses, poor oral intake and meds (ARB +/- NSAIDs) - Hold culprit medications , continue IV normal saline hydration, patient still orthostatic as of yesterday therefore continue IV fluids in the setting of persistent nausea and diarrhea- Renal ultrasound without hydronephrosnephrology has signed off  Hypokalemia-repleted  Abnormal troponin-likely demand ischemia in the setting of renal insufficiency, continue to cycle cardiac enzymes and if remains stable no further workup indicated, resume Plavix, 2-D echo to rule out wall motion abnormalities   Fall  and right facial trauma and other superficial trauma/ataxia/peripheral neuropathy - History of gait ataxia for a few months (sees outpatient [place] neurology) and falls at home - No significant fractures or dislocation on imaging studies patient has multiple bruises  on her upper extremities including a facial bruise, patient was taking Plavix at home  - PT and OT evaluation. Will SNF at discharge - Her falls may be multifactorial related to decreased proprioception (blind left eye, peripheral neuropathy), advanced age and weakness CT head negative for acute intracranial abnormality   Nausea, vomiting and diarrhea likely viral, C. difficile negative, will start the patient on Imodium if diarrhea continues today otherwise try to hold off since the patient still has an elevated white count of 18.4. Will order CT abdomen pelvis without IV contrast given renal failure  hold off on antibiotics pending CT results  As per daughter, may have chronic intermittent symptoms related to abdominal discomfort/dyspepsia and outpatient GI workup/colonoscopy being contemplated.  Dehydration - Continue IV fluids  Essential hypertension - Patient on several medications at home and it is not entirely clear which one of those she actually takes. Listed are atenolol, ARB and clonidine , continue atenolol and Norvasc, dose of atenolol increased to 100 mg by mouth twice a day, patient also orthostatic therefore started on compression stockings   HLD - Possibly on statins at home.  Mild anterior subluxation of right TMJ - Asymptomatic at this time. Monitor  Right thyroid nodule - Seen on CT and reported with benign features and radiologist recommends no further evaluation based on consensus criteria   DVT prophylaxis: SCDs  Code Status: Full  Family Communication:Discussed with daughter at bedside. Disposition Plan: Possibly SNF anticipate discharge in 2-3 days.    Consultants:  Nephrology  Procedures:  None  Antibiotics:  None   Objective: Filed Vitals:   03/22/15 0513 03/22/15 0635 03/22/15 0920 03/22/15 1253  BP: 192/82 164/72 177/67 164/78  Pulse: 66  71 76  Temp: 98.4 F (36.9 C)   98.8 F (37.1 C)  TempSrc: Oral   Oral  Resp: 18  19     Height:      Weight: 60.89 kg (134 lb 3.8 oz)     SpO2: 98%   97%    Intake/Output Summary (Last 24 hours) at 03/22/15 1305 Last data filed at 03/22/15 0921  Gross per 24 hour  Intake   2310 ml  Output   1200 ml  Net   1110 ml   Filed Weights   03/20/15 0500 03/21/15 0415 03/22/15 0513  Weight: 60.3 kg (132 lb 15 oz) 61.553 kg (135 lb 11.2 oz) 60.89 kg (134 lb 3.8 oz)     Exam:  General exam: Pleasant elderly female lying comfortably in bed. Extensive right facial bruising and infraorbital hematoma. (Picture in H&P) Respiratory system: Clear. No increased work of breathing. Cardiovascular system: S1 & S2 heard, RRR. No JVD, murmurs, gallops, clicks or pedal edema. Gastrointestinal system: Abdomen is nondistended, soft and nontender. Normal bowel sounds heard. Central nervous system: Alert and oriented. No focal neurological deficits. Extremities: Symmetric 5 x 5 power.   Data Reviewed: Basic Metabolic Panel:  Recent Labs Lab 03/19/15 1523 03/20/15 0436 03/21/15 0515 03/22/15 1042  NA 138 137 137 131*  K 3.7 3.5 2.9* 4.5  CL 95* 105 106 107  CO2 27 21* 20* 17*  GLUCOSE 170* 151* 151* 178*  BUN 49* 55* 54* 40*  CREATININE 4.30* 4.16* 2.81* 1.68*  CALCIUM 9.2 8.2* 8.1* 7.9*  MG  --   --  1.7  --    Liver Function Tests:  Recent Labs Lab 03/20/15 0436 03/22/15 1042  AST 22 31  ALT 13* 27  ALKPHOS 46 48  BILITOT 0.5 0.4  PROT 5.7* 5.7*  ALBUMIN 3.5 3.4*   No results for input(s): LIPASE, AMYLASE in the last 168 hours. No results for input(s): AMMONIA in the last 168 hours. CBC:  Recent Labs Lab 03/19/15 1523 03/20/15 0436 03/21/15 0515 03/22/15 1042  WBC 16.7* 15.0* 15.2* 18.4*  NEUTROABS 13.6*  --   --   --   HGB 14.2 13.1 13.0 13.2  HCT 40.7 38.7 38.2 38.2  MCV 95.1 96.8 96.5 95.3  PLT 283 265 245 274   Cardiac Enzymes:  Recent Labs Lab 03/19/15 1523 03/22/15 1042  CKTOTAL 127  --   TROPONINI <0.03 0.09*   BNP (last 3 results) No  results for input(s): PROBNP in the last 8760 hours. CBG: No results for input(s): GLUCAP in the last 168 hours.  Recent Results (from the past 240 hour(s))  Urine culture     Status: None   Collection Time: 03/19/15  4:00 PM  Result Value Ref Range Status   Specimen Description URINE, CATHETERIZED  Final   Special Requests NONE  Final   Culture   Final    NO GROWTH 1 DAY Performed at [facility]    Report Status 03/20/2015 FINAL  Final  C difficile quick scan w PCR reflex     Status: None   Collection Time: 03/20/15  2:04 AM  Result Value Ref Range Status   C Diff antigen NEGATIVE NEGATIVE Final   C Diff toxin NEGATIVE NEGATIVE Final   C Diff interpretation Negative for toxigenic C. difficile  Final           Studies: Dg Chest Port 1 View  03/21/2015   CLINICAL DATA:  Fall.  Possible fever.  EXAM: PORTABLE CHEST - 1 VIEW  COMPARISON:  None.  FINDINGS: Mediastinum and hilar structures normal. Mild left base subsegmental  atelectasis. No pleural effusion or pneumothorax. Heart size normal. No acute bony abnormality .  IMPRESSION: Mild left base subsegmental atelectasis.  Otherwise negative chest.   Electronically Signed   By: Marcello Moores  Register   On: 03/21/2015 11:56        Scheduled Meds: . amLODipine  10 mg Oral Daily  . atenolol  100 mg Oral BID  . lubiprostone  24 mcg Oral BID WC  . potassium chloride  40 mEq Oral TID  . sodium chloride  3 mL Intravenous Q12H   Continuous Infusions: . 0.9 % NaCl with KCl 40 mEq / L 75 mL/hr (03/22/15 1105)    Principal Problem:   AKI (acute kidney injury) Active Problems:   Dyslipidemia   Essential hypertension   Ataxia   Peripheral neuropathy   Fall at home   Facial trauma-right side   Nausea vomiting and diarrhea   Dehydration   Acute kidney injury    Time spent: 12 minutes    Reyne Dumas, MD,  [facility] Pager [phone]  If 7PM-7AM, please contact night-coverage [email or web address] Password  [email or web address] 03/22/2015, 1:05 PM    LOS: 3 days

## 2015-03-23 DIAGNOSIS — K2980 Duodenitis without bleeding: Secondary | ICD-10-CM

## 2015-03-23 LAB — CBC
HEMATOCRIT: 37.8 % (ref 36.0–46.0)
HEMOGLOBIN: 12.7 g/dL (ref 12.0–15.0)
MCH: 33.2 pg (ref 26.0–34.0)
MCHC: 33.6 g/dL (ref 30.0–36.0)
MCV: 99 fL (ref 78.0–100.0)
Platelets: 178 10*3/uL (ref 150–400)
RBC: 3.82 MIL/uL — ABNORMAL LOW (ref 3.87–5.11)
RDW: 12.7 % (ref 11.5–15.5)
WBC: 12 10*3/uL — ABNORMAL HIGH (ref 4.0–10.5)

## 2015-03-23 LAB — DIFFERENTIAL
BASOS ABS: 0 10*3/uL (ref 0.0–0.1)
Basophils Relative: 0 %
EOS ABS: 0 10*3/uL (ref 0.0–0.7)
Eosinophils Relative: 0 %
LYMPHS ABS: 1.4 10*3/uL (ref 0.7–4.0)
LYMPHS PCT: 11 %
Monocytes Absolute: 1.1 10*3/uL — ABNORMAL HIGH (ref 0.1–1.0)
Monocytes Relative: 9 %
NEUTROS PCT: 80 %
Neutro Abs: 9.5 10*3/uL — ABNORMAL HIGH (ref 1.7–7.7)

## 2015-03-23 LAB — COMPREHENSIVE METABOLIC PANEL
ALBUMIN: 3.1 g/dL — AB (ref 3.5–5.0)
ALK PHOS: 42 U/L (ref 38–126)
ALT: 54 U/L (ref 14–54)
AST: 39 U/L (ref 15–41)
Anion gap: 9 (ref 5–15)
BILIRUBIN TOTAL: 0.4 mg/dL (ref 0.3–1.2)
BUN: 30 mg/dL — AB (ref 6–20)
CO2: 14 mmol/L — AB (ref 22–32)
Calcium: 8.2 mg/dL — ABNORMAL LOW (ref 8.9–10.3)
Chloride: 110 mmol/L (ref 101–111)
Creatinine, Ser: 1.44 mg/dL — ABNORMAL HIGH (ref 0.44–1.00)
GFR calc Af Amer: 38 mL/min — ABNORMAL LOW (ref >60)
GFR calc non Af Amer: 33 mL/min — ABNORMAL LOW (ref >60)
GLUCOSE: 182 mg/dL — AB (ref 65–99)
POTASSIUM: 4 mmol/L (ref 3.5–5.1)
SODIUM: 133 mmol/L — AB (ref 135–145)
TOTAL PROTEIN: 5.3 g/dL — AB (ref 6.5–8.1)

## 2015-03-23 LAB — TROPONIN I: Troponin I: 0.06 ng/mL — ABNORMAL HIGH (ref <0.031)

## 2015-03-23 MED ORDER — PANTOPRAZOLE SODIUM 40 MG IV SOLR
40.0000 mg | Freq: Every day | INTRAVENOUS | Status: DC
  Administered 2015-03-23: 40 mg via INTRAVENOUS
  Filled 2015-03-23: qty 40

## 2015-03-23 MED ORDER — CIPROFLOXACIN IN D5W 400 MG/200ML IV SOLN
400.0000 mg | INTRAVENOUS | Status: DC
  Administered 2015-03-23 – 2015-03-24 (×2): 400 mg via INTRAVENOUS
  Filled 2015-03-23 (×2): qty 200

## 2015-03-23 MED ORDER — SODIUM CHLORIDE 0.9 % IV SOLN
INTRAVENOUS | Status: DC
  Administered 2015-03-23 – 2015-03-24 (×2): via INTRAVENOUS

## 2015-03-23 MED ORDER — METRONIDAZOLE IN NACL 5-0.79 MG/ML-% IV SOLN
500.0000 mg | Freq: Three times a day (TID) | INTRAVENOUS | Status: DC
  Administered 2015-03-23 – 2015-03-24 (×4): 500 mg via INTRAVENOUS
  Filled 2015-03-23 (×4): qty 100

## 2015-03-23 MED ORDER — ENSURE ENLIVE PO LIQD: 237.0000 mL | Freq: Two times a day (BID) | ORAL | Status: DC

## 2015-03-23 NOTE — Progress Notes (Signed)
Occupational Therapy Treatment Patient Details Name: Sandra [name] MRN: [id number] DOB: 01-26-1929 Today's Date: 03/23/2015    History of present illness This 81 y.o. Female admitted after sustaining unwitnessed fall at home.  She was found to have AKI, Rt facial trauma.  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    OT comments  Pt participated in ADLs this am.  Min guard for safety with standing and SPT.  Follow Up Recommendations  SNF    Equipment Recommendations  3 in 1 bedside comode;Tub/shower bench    Recommendations for Other Services      Precautions / Restrictions Precautions Precautions: Fall       Mobility Bed Mobility         Supine to sit: Supervision     General bed mobility comments: use of bedrail  Transfers   Equipment used: Rolling walker (2 wheeled) Transfers: Sit to/from [employer] to Stand: Min guard Stand pivot transfers: Min guard       General transfer comment: for safety; cues for UE placement    Balance               Standing balance comment: min guard during adls with use of RW                   ADL       Grooming: Oral care;Supervision/safety;Standing   Upper Body Bathing: Set up;Sitting   Lower Body Bathing: Minimal assistance;Sit to/from stand   Upper Body Dressing : Set up;Sitting       Toilet Transfer: Min guard;Stand-pivot (to recliner)             General ADL Comments: performed ADL from EOB sit to stand.  Pt did not have toileting needs this morning      Vision                     Perception     Praxis      Cognition   Behavior During Therapy: Flat affect Overall Cognitive Status: No family/caregiver present to determine baseline cognitive functioning     Current Attention Level: Selective      Safety/Judgement: Decreased awareness of safety;Decreased awareness of deficits           Extremity/Trunk Assessment               Exercises     Shoulder Instructions       General Comments      Pertinent Vitals/ Pain       Pain Assessment: No/denies pain  Home Living                                          Prior Functioning/Environment              Frequency Min 2X/week     Progress Toward Goals  OT Goals(current goals can now be found in the care plan section)  Progress towards OT goals: Progressing toward goals     Plan Discharge plan remains appropriate    Co-evaluation                 End of Session     Activity Tolerance     Patient Left     Nurse Communication  Time: 7125-2712 OT Time Calculation (min): 26 min  Charges: OT General Charges $OT Visit: 1 Procedure OT Treatments $Self Care/Home Management : 23-37 mins  SPENCER,MARYELLEN 03/23/2015, 9:54 AM Lesle Chris, OTR/L [phone] 03/23/2015

## 2015-03-23 NOTE — Progress Notes (Addendum)
PROGRESS NOTE    Sandra [name] ATF:[id number] DOB: March 29, 1929 DOA: 03/19/2015 PCP: Walker Kehr, MD  HPI/Brief narrative 81 y.o. 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 In the ED, evaluation revealed mildly elevated blood pressures, low-grade temperature, creatinine 4.3, WBC 16.7, negative troponin and CK 1 and extensive imaging with chest x-ray, CT head, CT cervical spine, x-ray left shoulder, CT maxillofacial only significant for mild anterior subluxation of right TMJ. Hospitalist admission was requested.  Subjective-patient somewhat better today, denies nausea, documented to have 7 bowel movements yesterday  Assessment/Plan:  Acute kidney injury-prerenal - Baseline creatinine 0.95 on 01/30/15. Admitted with creatinine of 4.3, now 1.44, improving - Multifactorial secondary to dehydration from GI losses, poor oral intake and meds (ARB +/- NSAIDs) - Hold culprit medications , continue IV normal saline hydration,  Renal ultrasound without hydronephrosnephrology has signed off   Hypokalemia-repleted  Abnormal troponin-likely demand ischemia in the setting of renal insufficiency, continue to cycle cardiac enzymes and if remains stable no further workup indicated, resume Plavix, 2-D echo shows EF of 55-60%, no gross  regional wall motion abnormalities, grade 2 diastolic dysfunction   Fall  and right facial trauma and other superficial trauma/ataxia/peripheral neuropathy - History of gait ataxia for a few months (sees outpatient [place] neurology) and falls at home - No significant fractures or dislocation on imaging studies patient has multiple bruises on her upper extremities including a facial bruise, patient was  taking Plavix at home  - PT and OT evaluation recommended SNF CT head negative for acute intracranial abnormality    Probable Duodenitis? Nausea, vomiting and diarrhea CT of the abdomen and pelvis was done yesterday without contrast that showed circumferential thickening of the duodenum with periduodenal mesenteric stranding suggestive of infectious versus inflammatory changes C. difficile negative, patient started on ciprofloxacin and Flagyl IV Patient will need outpatient GI workup/EGD /colonoscopy being contemplated.  Dehydration - Continue IV fluids for another day  Essential hypertension - Patient on several medications at home and it is not entirely clear which one of those she actually takes. Listed are atenolol, ARB and clonidine , continue atenolol and Norvasc, dose of atenolol increased to 100 mg by mouth twice a day, patient also orthostatic therefore started on compression stockings   HLD - Possibly on statins at home.  Mild anterior subluxation of right TMJ - Asymptomatic at this time. Monitor  Right thyroid nodule - Seen on CT and reported with benign features and radiologist recommends no further evaluation based on consensus criteria   DVT prophylaxis: SCDs  Code Status: Full  Family Communication:Discussed with daughter on the phone [phone] Disposition Plan: Possibly SNF anticipate discharge 9/17, social worker notified  Consultants:  Nephrology  Procedures:  None  Antibiotics:  None   Objective: Filed Vitals:   03/22/15 0920 03/22/15 1253 03/22/15 2226 03/23/15 0658  BP: 177/67 164/78 168/80 170/67  Pulse: 71 76 78 74  Temp:  98.8 F (37.1 C) 98.8 F (37.1 C) 98.3 F (36.8 C)  TempSrc:  Oral Oral Oral  Resp: 19  20 20   Height:      Weight:      SpO2:  97% 98% 96%    Intake/Output Summary (Last 24 hours) at 03/23/15 1300 Last data filed at  03/22/15 1534  Gross per 24 hour  Intake    745 ml  Output      0 ml  Net    745 ml    Filed Weights   03/20/15 0500 03/21/15 0415 03/22/15 0513  Weight: 60.3 kg (132 lb 15 oz) 61.553 kg (135 lb 11.2 oz) 60.89 kg (134 lb 3.8 oz)     Exam:  General exam: Pleasant elderly female lying comfortably in bed. Extensive right facial bruising and infraorbital hematoma. (Picture in H&P) Respiratory system: Clear. No increased work of breathing. Cardiovascular system: S1 & S2 heard, RRR. No JVD, murmurs, gallops, clicks or pedal edema. Gastrointestinal system: Abdomen is nondistended, soft and nontender. Normal bowel sounds heard. Central nervous system: Alert and oriented. No focal neurological deficits. Extremities: Symmetric 5 x 5 power.   Data Reviewed: Basic Metabolic Panel:  Recent Labs Lab 03/19/15 1523 03/20/15 0436 03/21/15 0515 03/22/15 1042 03/23/15 1106  NA 138 137 137 131* 133*  K 3.7 3.5 2.9* 4.5 4.0  CL 95* 105 106 107 110  CO2 27 21* 20* 17* 14*  GLUCOSE 170* 151* 151* 178* 182*  BUN 49* 55* 54* 40* 30*  CREATININE 4.30* 4.16* 2.81* 1.68* 1.44*  CALCIUM 9.2 8.2* 8.1* 7.9* 8.2*  MG  --   --  1.7  --   --    Liver Function Tests:  Recent Labs Lab 03/20/15 0436 03/22/15 1042 03/23/15 1106  AST 22 31 39  ALT 13* 27 54  ALKPHOS 46 48 42  BILITOT 0.5 0.4 0.4  PROT 5.7* 5.7* 5.3*  ALBUMIN 3.5 3.4* 3.1*   No results for input(s): LIPASE, AMYLASE in the last 168 hours. No results for input(s): AMMONIA in the last 168 hours. CBC:  Recent Labs Lab 03/19/15 1523 03/20/15 0436 03/21/15 0515 03/22/15 1042 03/23/15 1106  WBC 16.7* 15.0* 15.2* 18.4* 12.0*  NEUTROABS 13.6*  --   --   --  9.5*  HGB 14.2 13.1 13.0 13.2 12.7  HCT 40.7 38.7 38.2 38.2 37.8  MCV 95.1 96.8 96.5 95.3 99.0  PLT 283 265 245 274 178   Cardiac Enzymes:  Recent Labs Lab 03/19/15 1523 03/22/15 1042  CKTOTAL 127  --   TROPONINI <0.03 0.09*   BNP (last 3 results) No results for input(s): PROBNP in the last 8760 hours. CBG: No results for input(s): GLUCAP in the  last 168 hours.  Recent Results (from the past 240 hour(s))  Urine culture     Status: None   Collection Time: 03/19/15  4:00 PM  Result Value Ref Range Status   Specimen Description URINE, CATHETERIZED  Final   Special Requests NONE  Final   Culture   Final    NO GROWTH 1 DAY Performed at [facility]    Report Status 03/20/2015 FINAL  Final  C difficile quick scan w PCR reflex     Status: None   Collection Time: 03/20/15  2:04 AM  Result Value Ref Range Status   C Diff antigen NEGATIVE NEGATIVE Final   C Diff toxin NEGATIVE NEGATIVE Final   C Diff interpretation Negative for toxigenic C. difficile  Final           Studies: Ct Abdomen Pelvis Wo Contrast  03/22/2015   CLINICAL DATA:  Nausea vomiting and diarrhea for 3 days. Recent fall.  EXAM: CT ABDOMEN AND PELVIS WITHOUT CONTRAST  TECHNIQUE: Multidetector CT imaging of the abdomen and pelvis was performed following the standard protocol without IV contrast.  COMPARISON:  None.  FINDINGS: The lung bases demonstrate mild right pleural thickening and right lower lobe atelectasis. There is coronary artery disease, and atherosclerotic disease of the aorta.  The liver, spleen, adrenals, pancreas, kidneys are unremarkable.  There is no evidence of bowel obstruction. There is circumferential wall thickening of the duodenum with associated periduodenal mesenteric stranding. A moderate amount of fecal retention is appreciated within the cecum. The remainder of the colon is decompressed.  There has been a prior hysterectomy  There is no evidence of abdominal aortic aneurysm.  There is no evidence of abdominal or pelvic free fluid, loculated fluid collections, masses, nor adenopathy. Extensive mural calcifications identified within the aorta and mesenteric vessels.  There is no evidence of abdominal wall nor inguinal hernia.  There is no evidence of aggressive appearing osseous lesions. Osteoarthritic changes of lumbosacral spine as seen.   IMPRESSION: Circumferential wall thickening of the duodenum with associated periduodenal mesenteric stranding. Findings are most suggestive of infectious or inflammatory changes. Correlation to endoscopic evaluation may be considered.  Poorly evaluated wall thickening of the rectum. Correlation to physical exam and colonoscopy may be considered.  No acute abnormalities within the solid abdominal organs are seen, accounting for lack of IV and oral contrast.  Atherosclerotic disease of the aorta and coronary arteries.   Electronically Signed   By: Fidela Salisbury M.D.   On: 03/22/2015 15:31        Scheduled Meds: . amLODipine  10 mg Oral Daily  . atenolol  100 mg Oral BID  . atorvastatin  20 mg Oral q1800  . ciprofloxacin  400 mg Intravenous Q24H  . cloNIDine  0.1 mg Oral BID  . clopidogrel  75 mg Oral Daily  . gabapentin  100 mg Oral TID  . lubiprostone  24 mcg Oral BID WC  . metronidazole  500 mg Intravenous Q8H  . sodium chloride  3 mL Intravenous Q12H   Continuous Infusions: . sodium chloride 75 mL/hr at 03/23/15 1107    Principal Problem:   AKI (acute kidney injury) Active Problems:   Dyslipidemia   Essential hypertension   Ataxia   Peripheral neuropathy   Fall at home   Facial trauma-right side   Nausea vomiting and diarrhea   Dehydration   Acute kidney injury    Time spent: 61 minutes    Reyne Dumas, MD,  [facility] Pager [phone]  If 7PM-7AM, please contact night-coverage [email or web address] Password [email or web address] 03/23/2015, 1:00 PM    LOS: 4 days

## 2015-03-23 NOTE — Progress Notes (Signed)
Physical Therapy Treatment Patient Details Name: Sandra [name] MRN: [id number] DOB: 07-13-1928 Today's Date: 03/23/2015    History of Present Illness This 81 y.o. Female admitted after sustaining unwitnessed fall at home.  She was found to have AKI, Rt facial trauma.  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     PT Comments    Pt OOB in recliner still feeling "bad".  Assisted to [facility] for loose stools and void.  Assisted with hygiene due to unsteadiness.  Limited activity tolerance with max c/o fatigue and overall not feeling well.  Amb limited distance then needed to use BSC again.  Returned to recliner and positioned to comfort.   Follow Up Recommendations  SNF     Equipment Recommendations       Recommendations for Other Services       Precautions / Restrictions Precautions Precautions: Fall Restrictions Weight Bearing Restrictions: No    Mobility  Bed Mobility         Supine to sit: Supervision     General bed mobility comments: OOB in recliner  Transfers Overall transfer level: Needs assistance Equipment used: Rolling walker (2 wheeled) Transfers: Sit to/from [employer] Sit to Stand: Min guard;Min assist Stand pivot transfers: Min guard;Min assist       General transfer comment: 50% VC's on proper hand placement with both sit to stand and stand to sit as pt tended to "plop" due to fatigueTransfered a total of 6 times due to need to use BSC teice.   Ambulation/Gait Ambulation/Gait assistance: Min assist Ambulation Distance (Feet): 14 Feet Assistive device: Rolling walker (2 wheeled) Gait Pattern/deviations: Step-to pattern;Shuffle;Trunk flexed Gait velocity: decreased   General Gait Details: limited activity tolerance due to overall not feeling well and mild c/o "woozyness".  Recliner following for safety.    Stairs            Wheelchair Mobility    Modified  [place] (Stroke Patients Only)       Balance               Standing balance comment: min guard during adls with use of RW                    Cognition Arousal/Alertness: Awake/alert Behavior During Therapy: Flat affect Overall Cognitive Status: Within Functional Limits for tasks assessed     Current Attention Level: Selective     Safety/Judgement: Decreased awareness of safety;Decreased awareness of deficits     General Comments: just didn't feel well    Exercises      General Comments        Pertinent Vitals/Pain Pain Assessment: No/denies pain    Home Living                      Prior Function            PT Goals (current goals can now be found in the care plan section) Progress towards PT goals: Progressing toward goals    Frequency  Min 3X/week    PT Plan Current plan remains appropriate    Co-evaluation             End of Session Equipment Utilized During Treatment: Gait belt Activity Tolerance: Patient limited by fatigue;Treatment limited secondary to medical complications (Comment) Patient left: in chair;with call bell/phone within reach;with chair alarm set     Time: 1000-1025 PT Time Calculation (min) (ACUTE  ONLY): 25 min  Charges:  $Gait Training: 8-22 mins $Therapeutic Activity: 8-22 mins                    G Codes:      Rica Koyanagi  PTA [facility]  Acute  Rehab Pager      [phone]

## 2015-03-23 NOTE — Clinical Social Work Placement (Signed)
CSW confirmed with Nira Conn at [facility] that they would be able to take patient over the weekend if ready - would need the d/c summary by 10am as their pharmacy closes early on the weekends.   Please call weekend CSW, Rollene Fare (ph#: [phone]) to facilitate discharge.      Raynaldo Opitz, [facility] Clinical Social Worker cell #: [phone]   CLINICAL SOCIAL WORK PLACEMENT  NOTE  Date:  03/23/2015  Patient Details  Name: Sandra [name] MRN: [id number] Date of Birth: Feb 05, 1929  Clinical Social Work is seeking post-discharge placement for this patient at the [place] level of care (*CSW will initial, date and re-position this form in  chart as items are completed):  Yes   Patient/family provided with [facility]'s list of facilities offering this level of care within the geographic area requested by the patient (or if unable, by the patient's family).  Yes   Patient/family informed of their freedom to choose among providers that offer the needed level of care, that participate in Medicare, Medicaid or managed care program needed by the patient, have an available bed and are willing to accept the patient.  Yes   Patient/family informed of [facility]'s ownership interest in [facility] and [facility], as well as of the fact that they are under no obligation to receive care at these facilities.  PASRR submitted to [facility] on 03/21/15     PASRR number received on 03/21/15     Existing PASRR number confirmed on       FL2 transmitted to all facilities in geographic area requested by pt/family on 03/21/15     FL2 transmitted to all facilities within larger geographic area on       Patient informed that his/her managed care company has contracts with or will negotiate with certain facilities, including the following:        Yes   Patient/family informed of bed offers received.  Patient  chooses bed at [place], [place]     Physician recommends and patient chooses bed at      Patient to be transferred to [place], [place] on  .  Patient to be transferred to facility by       Patient family notified on   of transfer.  Name of family member notified:        PHYSICIAN       Additional Comment:    _______________________________________________ Standley Brooking, LCSW 03/23/2015, 1:08 PM

## 2015-03-24 LAB — CBC
HEMATOCRIT: 33.8 % — AB (ref 36.0–46.0)
HEMOGLOBIN: 11.7 g/dL — AB (ref 12.0–15.0)
MCH: 33.3 pg (ref 26.0–34.0)
MCHC: 34.6 g/dL (ref 30.0–36.0)
MCV: 96.3 fL (ref 78.0–100.0)
Platelets: 185 10*3/uL (ref 150–400)
RBC: 3.51 MIL/uL — ABNORMAL LOW (ref 3.87–5.11)
RDW: 12.6 % (ref 11.5–15.5)
WBC: 9.1 10*3/uL (ref 4.0–10.5)

## 2015-03-24 LAB — GI PATHOGEN PANEL BY PCR, STOOL
C DIFFICILE TOXIN A/B: NOT DETECTED
CRYPTOSPORIDIUM BY PCR: NOT DETECTED
Campylobacter by PCR: NOT DETECTED
E COLI (STEC): NOT DETECTED
E coli (ETEC) LT/ST: NOT DETECTED
E coli 0157 by PCR: NOT DETECTED
G lamblia by PCR: NOT DETECTED
Norovirus GI/GII: NOT DETECTED
ROTAVIRUS A BY PCR: NOT DETECTED
Salmonella by PCR: NOT DETECTED
Shigella by PCR: NOT DETECTED

## 2015-03-24 MED ORDER — AMLODIPINE BESYLATE 10 MG PO TABS: 10.0000 mg | ORAL_TABLET | Freq: Every day | ORAL | Status: DC

## 2015-03-24 MED ORDER — METRONIDAZOLE 500 MG PO TABS: 500.0000 mg | ORAL_TABLET | Freq: Three times a day (TID) | ORAL | Status: DC

## 2015-03-24 MED ORDER — OXYCODONE HCL 5 MG PO TABS: 5.0000 mg | ORAL_TABLET | Freq: Four times a day (QID) | ORAL | Status: DC | PRN

## 2015-03-24 MED ORDER — ENSURE ENLIVE PO LIQD: 237.0000 mL | Freq: Two times a day (BID) | ORAL | Status: DC

## 2015-03-24 MED ORDER — ALPRAZOLAM 0.5 MG PO TABS: 0.5000 mg | ORAL_TABLET | Freq: Three times a day (TID) | ORAL | Status: DC | PRN

## 2015-03-24 MED ORDER — PANTOPRAZOLE SODIUM 40 MG PO TBEC: 40.0000 mg | DELAYED_RELEASE_TABLET | Freq: Every day | ORAL | Status: DC

## 2015-03-24 MED ORDER — CIPROFLOXACIN HCL 500 MG PO TABS: 500.0000 mg | ORAL_TABLET | Freq: Two times a day (BID) | ORAL | Status: DC

## 2015-03-24 NOTE — Progress Notes (Signed)
Report called to cynthia at [facility]. Brigitte Pulse, RN

## 2015-03-24 NOTE — Discharge Summary (Signed)
Physician Discharge Summary  Sandra [name] MRN: [id number] DOB/AGE: [birth year] 81 y.o.  PCP: Walker Kehr, MD   Admit date: 03/19/2015 Discharge date: 03/24/2015  Discharge Diagnoses:     Principal Problem:   AKI (acute kidney injury) Active Problems:   Dyslipidemia   Essential hypertension   Ataxia   Peripheral neuropathy   Fall at home   Facial trauma-right side   Nausea vomiting and diarrhea   Dehydration   Acute kidney injury   Duodenitis without bleeding    Follow-up recommendations Follow-up with PCP in 3-5 days , including all  additional recommended appointments as below Follow-up CBC, CMP in 3-5 days Patient would need outpatient GI evaluation with EGD and colonoscopy Patient to continue on a soft heart healthy diet     Medication List    TAKE these medications        ALPRAZolam 0.5 MG tablet  Commonly known as:  XANAX  Take 1 tablet (0.5 mg total) by mouth 3 (three) times daily as needed for anxiety.     amLODipine 10 MG tablet  Commonly known as:  NORVASC  Take 1 tablet (10 mg total) by mouth daily.     atenolol 100 MG tablet  Commonly known as:  TENORMIN  Take 1 tablet (100 mg total) by mouth 2 (two) times daily.     cholecalciferol 1000 UNITS tablet  Commonly known as:  VITAMIN D  Take 1,000 Units by mouth daily.     ciprofloxacin 500 MG tablet  Commonly known as:  CIPRO  Take 1 tablet (500 mg total) by mouth 2 (two) times daily.     cloNIDine 0.1 MG tablet  Commonly known as:  CATAPRES  Take 1 tablet (0.1 mg total) by mouth 2 (two) times daily. For sweating and for the blood pressure.     clopidogrel 75 MG tablet  Commonly known as:  PLAVIX  Take 75 mg by mouth daily.     feeding supplement (ENSURE ENLIVE) Liqd  Take 237 mLs by mouth 2 (two) times daily between meals.     gabapentin 100 MG capsule  Commonly known as:  NEURONTIN  Take 100 mg by mouth 3 (three) times daily.     lubiprostone 24 MCG capsule  Commonly known as:   AMITIZA  Take 1 capsule (24 mcg total) by mouth 2 (two) times daily with a meal.     metroNIDAZOLE 500 MG tablet  Commonly known as:  FLAGYL  Take 1 tablet (500 mg total) by mouth 3 (three) times daily.     oxyCODONE 5 MG immediate release tablet  Commonly known as:  Oxy IR/ROXICODONE  Take 1 tablet (5 mg total) by mouth every 6 (six) hours as needed for severe pain.     pantoprazole 40 MG tablet  Commonly known as:  PROTONIX  Take 1 tablet (40 mg total) by mouth daily.     QUEtiapine 100 MG tablet  Commonly known as:  SEROQUEL  Take 100-200 mg by mouth at bedtime as needed (insomnia).     simvastatin 40 MG tablet  Commonly known as:  ZOCOR  Take 1 tablet (40 mg total) by mouth at bedtime.     temazepam 30 MG capsule  Commonly known as:  RESTORIL  TAKE 2 CAPSULES AT BEDTIME AS NEEDED FOR SLEEP     traMADol 50 MG tablet  Commonly known as:  ULTRAM  Take 50-100 mg by mouth 2 (two) times daily as needed for moderate pain.  vitamin B-12 1000 MCG tablet  Commonly known as:  CYANOCOBALAMIN  Take 1,000 mcg by mouth daily.     vitamin C 100 MG tablet  Take 100 mg by mouth daily.         Discharge Condition: *Stable  Disposition: SNF    Consults: None  Significant Diagnostic Studies:  Ct Abdomen Pelvis Wo Contrast  03/22/2015   CLINICAL DATA:  Nausea vomiting and diarrhea for 3 days. Recent fall.  EXAM: CT ABDOMEN AND PELVIS WITHOUT CONTRAST  TECHNIQUE: Multidetector CT imaging of the abdomen and pelvis was performed following the standard protocol without IV contrast.  COMPARISON:  None.  FINDINGS: The lung bases demonstrate mild right pleural thickening and right lower lobe atelectasis. There is coronary artery disease, and atherosclerotic disease of the aorta.  The liver, spleen, adrenals, pancreas, kidneys are unremarkable.  There is no evidence of bowel obstruction. There is circumferential wall thickening of the duodenum with associated periduodenal mesenteric  stranding. A moderate amount of fecal retention is appreciated within the cecum. The remainder of the colon is decompressed.  There has been a prior hysterectomy  There is no evidence of abdominal aortic aneurysm.  There is no evidence of abdominal or pelvic free fluid, loculated fluid collections, masses, nor adenopathy. Extensive mural calcifications identified within the aorta and mesenteric vessels.  There is no evidence of abdominal wall nor inguinal hernia.  There is no evidence of aggressive appearing osseous lesions. Osteoarthritic changes of lumbosacral spine as seen.  IMPRESSION: Circumferential wall thickening of the duodenum with associated periduodenal mesenteric stranding. Findings are most suggestive of infectious or inflammatory changes. Correlation to endoscopic evaluation may be considered.  Poorly evaluated wall thickening of the rectum. Correlation to physical exam and colonoscopy may be considered.  No acute abnormalities within the solid abdominal organs are seen, accounting for lack of IV and oral contrast.  Atherosclerotic disease of the aorta and coronary arteries.   Electronically Signed   By: Fidela Salisbury M.D.   On: 03/22/2015 15:31   Dg Chest 2 View  03/19/2015   CLINICAL DATA:  Fall sometime in the last 2 days. Left shoulder pain. Patient found with swelling of the face. Vomiting.  EXAM: CHEST  2 VIEW  COMPARISON:  03/09/2012  FINDINGS: Atherosclerotic aortic arch. Heart size within normal limits. Stable prominence of the epicardial adipose tissue. No pneumothorax or pleural effusion.  Mild thoracic spondylosis.  IMPRESSION: 1. No acute thoracic findings. 2. Atherosclerotic aortic arch.   Electronically Signed   By: Van Clines M.D.   On: 03/19/2015 16:30   Dg Shoulder Right  03/19/2015   CLINICAL DATA:  Patient lives at home alone and fell some time over the past 2 days. Patient's family had been trying to call with no answer. When they arrived from out of town the  patient was in bed with the right side of her face badly swollen and bruised. Blood was found all over the bathroom. Patient had fallen gotten into bed and was asleep when her family arrived. There was vomit found in the patient's bed. Patient is alert and oriented at this time, but has some loss of memory about the fall.  EXAM: RIGHT SHOULDER - 2+ VIEW  COMPARISON:  None.  FINDINGS: No fracture or dislocation. Mild AC joint osteoarthritis. Glenohumeral joint is relatively well maintained. Bones are demineralized. Soft tissues are unremarkable.  IMPRESSION: No fracture or dislocation.   Electronically Signed   By: Lajean Manes M.D.   On: 03/19/2015  19:47   Ct Head Wo Contrast  03/19/2015   CLINICAL DATA:  Patient lives at home alone and fell some time over the past 2 days. Patient's family had been trying to call with no answer. When they arrived from out of town the patient was in bed with the right side of her face badly swollen and bruised. Blood was found all over the bathroom. Patient had fallen gotten into bed and was asleep when her family arrived. There was vomit found in the patient's bed. Patient is alert and oriented at this time, but has some loss of memory about the fall. Pt has hypertension.  EXAM: CT HEAD WITHOUT CONTRAST  CT MAXILLOFACIAL WITHOUT CONTRAST  CT CERVICAL SPINE WITHOUT CONTRAST  TECHNIQUE: Multidetector CT imaging of the head, cervical spine, and maxillofacial structures were performed using the standard protocol without intravenous contrast. Multiplanar CT image reconstructions of the cervical spine and maxillofacial structures were also generated.  COMPARISON:  MRI and 02/08/2015  FINDINGS: CT HEAD FINDINGS  There is central and cortical atrophy. Periventricular white matter changes are consistent with small vessel disease. There is no intra or extra-axial fluid collection or mass lesion. The basilar cisterns and ventricles have a normal appearance. There is no CT evidence for  acute infarction or hemorrhage. Bone windows show soft tissue edema superficial to the right zygomatic arch and right orbit. No calvarial fracture identified.  CT MAXILLOFACIAL FINDINGS  There is moderate preseptal edema of the right orbit. The right globe is intact. There is significant soft tissue swelling superficial to the right zygomatic arch. A hematoma is seen superficial to the lateral wall of the maxilla which measures 3.4 x 1.6 cm. Soft tissue swelling extends inferiorly to level of the mandible. There is mild anterior subluxation of the temporomandibular joint without frank subluxation or fracture.  There is chronic asymmetry of the left globe without evidence for acute injury.  The nasal bones, bony nasal septum, zygomatic arches, mandible and pterygoid plates are intact. No evidence for acute fracture of the sinus walls or orbits.  CT CERVICAL SPINE FINDINGS  There is significant degenerative disc disease of the cervical spine, with moderate narrowing and posterior osteophyte formation at C3-4, C5-6. There is no acute fracture or subluxation. Note is made of atherosclerotic calcification of the carotid arteries bilaterally. Incidental note is made of a right thyroid nodule, measuring 1.4 cm.  IMPRESSION: 1. Atrophy and small vessel disease. 2.  No evidence for acute intracranial abnormality. 3. Significant soft tissue swelling involving the right side of the face extending from the right orbit inferiorly to the right aspect of the mandible. There is a moderate hematoma along the lateral wall of the right maxilla which measures 3.4 cm. Soft tissue swelling involves the preseptal soft tissues of the right orbit. The globe is intact. 4. Mild anterior subluxation of the right temporomandibular joint. No dislocation or fracture. 5. Chronic asymmetry of the left globe without acute injury. 6. Degenerative changes in the cervical spine. No evidence for acute cervical spine abnormality. 7. Incidental note of  right thyroid nodule with benign features. No further evaluation is felt to be necessary based on consensus criteria. 8. Carotid calcifications.   Electronically Signed   By: Nolon Nations M.D.   On: 03/19/2015 16:36   Ct Cervical Spine Wo Contrast  03/19/2015   CLINICAL DATA:  Patient lives at home alone and fell some time over the past 2 days. Patient's family had been trying to call with no answer.  When they arrived from out of town the patient was in bed with the right side of her face badly swollen and bruised. Blood was found all over the bathroom. Patient had fallen gotten into bed and was asleep when her family arrived. There was vomit found in the patient's bed. Patient is alert and oriented at this time, but has some loss of memory about the fall. Pt has hypertension.  EXAM: CT HEAD WITHOUT CONTRAST  CT MAXILLOFACIAL WITHOUT CONTRAST  CT CERVICAL SPINE WITHOUT CONTRAST  TECHNIQUE: Multidetector CT imaging of the head, cervical spine, and maxillofacial structures were performed using the standard protocol without intravenous contrast. Multiplanar CT image reconstructions of the cervical spine and maxillofacial structures were also generated.  COMPARISON:  MRI and 02/08/2015  FINDINGS: CT HEAD FINDINGS  There is central and cortical atrophy. Periventricular white matter changes are consistent with small vessel disease. There is no intra or extra-axial fluid collection or mass lesion. The basilar cisterns and ventricles have a normal appearance. There is no CT evidence for acute infarction or hemorrhage. Bone windows show soft tissue edema superficial to the right zygomatic arch and right orbit. No calvarial fracture identified.  CT MAXILLOFACIAL FINDINGS  There is moderate preseptal edema of the right orbit. The right globe is intact. There is significant soft tissue swelling superficial to the right zygomatic arch. A hematoma is seen superficial to the lateral wall of the maxilla which measures 3.4 x  1.6 cm. Soft tissue swelling extends inferiorly to level of the mandible. There is mild anterior subluxation of the temporomandibular joint without frank subluxation or fracture.  There is chronic asymmetry of the left globe without evidence for acute injury.  The nasal bones, bony nasal septum, zygomatic arches, mandible and pterygoid plates are intact. No evidence for acute fracture of the sinus walls or orbits.  CT CERVICAL SPINE FINDINGS  There is significant degenerative disc disease of the cervical spine, with moderate narrowing and posterior osteophyte formation at C3-4, C5-6. There is no acute fracture or subluxation. Note is made of atherosclerotic calcification of the carotid arteries bilaterally. Incidental note is made of a right thyroid nodule, measuring 1.4 cm.  IMPRESSION: 1. Atrophy and small vessel disease. 2.  No evidence for acute intracranial abnormality. 3. Significant soft tissue swelling involving the right side of the face extending from the right orbit inferiorly to the right aspect of the mandible. There is a moderate hematoma along the lateral wall of the right maxilla which measures 3.4 cm. Soft tissue swelling involves the preseptal soft tissues of the right orbit. The globe is intact. 4. Mild anterior subluxation of the right temporomandibular joint. No dislocation or fracture. 5. Chronic asymmetry of the left globe without acute injury. 6. Degenerative changes in the cervical spine. No evidence for acute cervical spine abnormality. 7. Incidental note of right thyroid nodule with benign features. No further evaluation is felt to be necessary based on consensus criteria. 8. Carotid calcifications.   Electronically Signed   By: Nolon Nations M.D.   On: 03/19/2015 16:36   US Renal  03/20/2015   CLINICAL DATA:  Acute kidney insufficiency.  EXAM: RENAL / URINARY TRACT ULTRASOUND COMPLETE  COMPARISON:  None.  FINDINGS: Right Kidney:  Length: 11.5 cm. Echogenicity within normal limits.  No mass or hydronephrosis visualized.  Left Kidney:  Length: 11.4 cm. Echogenicity within normal limits. In the lower pole left kidney, there is a 1.3 x 1.1 x 1.4 cm cyst with peripheral calcifications. No hydronephrosis visualized.  Bladder:  Appears normal for degree of bladder distention.  IMPRESSION: No acute abnormality.  1.4 cm cyst in lower pole left kidney with peripheral calcifications.   Electronically Signed   By: Abelardo Diesel M.D.   On: 03/20/2015 10:34   Dg Chest Port 1 View  03/21/2015   CLINICAL DATA:  Fall.  Possible fever.  EXAM: PORTABLE CHEST - 1 VIEW  COMPARISON:  None.  FINDINGS: Mediastinum and hilar structures normal. Mild left base subsegmental atelectasis. No pleural effusion or pneumothorax. Heart size normal. No acute bony abnormality .  IMPRESSION: Mild left base subsegmental atelectasis.  Otherwise negative chest.   Electronically Signed   By: Marcello Moores  Register   On: 03/21/2015 11:56   Dg Shoulder Left  03/19/2015   CLINICAL DATA:  Fall recently with left shoulder pain. Initial encounter.  EXAM: LEFT SHOULDER - 2+ VIEW  COMPARISON:  None.  FINDINGS: No acute fracture or dislocation is identified. There are moderate degenerative changes involving the glenohumeral joint and AC joint. Cystic changes are seen in the lateral humeral head. No bony destruction identified. Soft tissues are unremarkable.  IMPRESSION: No acute fracture. Moderate degenerative changes present involving the glenohumeral joint and AC joint.   Electronically Signed   By: Aletta Edouard M.D.   On: 03/19/2015 16:34   Ct Maxillofacial Wo Cm  03/19/2015   CLINICAL DATA:  Patient lives at home alone and fell some time over the past 2 days. Patient's family had been trying to call with no answer. When they arrived from out of town the patient was in bed with the right side of her face badly swollen and bruised. Blood was found all over the bathroom. Patient had fallen gotten into bed and was asleep when her family  arrived. There was vomit found in the patient's bed. Patient is alert and oriented at this time, but has some loss of memory about the fall. Pt has hypertension.  EXAM: CT HEAD WITHOUT CONTRAST  CT MAXILLOFACIAL WITHOUT CONTRAST  CT CERVICAL SPINE WITHOUT CONTRAST  TECHNIQUE: Multidetector CT imaging of the head, cervical spine, and maxillofacial structures were performed using the standard protocol without intravenous contrast. Multiplanar CT image reconstructions of the cervical spine and maxillofacial structures were also generated.  COMPARISON:  MRI and 02/08/2015  FINDINGS: CT HEAD FINDINGS  There is central and cortical atrophy. Periventricular white matter changes are consistent with small vessel disease. There is no intra or extra-axial fluid collection or mass lesion. The basilar cisterns and ventricles have a normal appearance. There is no CT evidence for acute infarction or hemorrhage. Bone windows show soft tissue edema superficial to the right zygomatic arch and right orbit. No calvarial fracture identified.  CT MAXILLOFACIAL FINDINGS  There is moderate preseptal edema of the right orbit. The right globe is intact. There is significant soft tissue swelling superficial to the right zygomatic arch. A hematoma is seen superficial to the lateral wall of the maxilla which measures 3.4 x 1.6 cm. Soft tissue swelling extends inferiorly to level of the mandible. There is mild anterior subluxation of the temporomandibular joint without frank subluxation or fracture.  There is chronic asymmetry of the left globe without evidence for acute injury.  The nasal bones, bony nasal septum, zygomatic arches, mandible and pterygoid plates are intact. No evidence for acute fracture of the sinus walls or orbits.  CT CERVICAL SPINE FINDINGS  There is significant degenerative disc disease of the cervical spine, with moderate narrowing and posterior osteophyte formation at C3-4,  C5-6. There is no acute fracture or subluxation.  Note is made of atherosclerotic calcification of the carotid arteries bilaterally. Incidental note is made of a right thyroid nodule, measuring 1.4 cm.  IMPRESSION: 1. Atrophy and small vessel disease. 2.  No evidence for acute intracranial abnormality. 3. Significant soft tissue swelling involving the right side of the face extending from the right orbit inferiorly to the right aspect of the mandible. There is a moderate hematoma along the lateral wall of the right maxilla which measures 3.4 cm. Soft tissue swelling involves the preseptal soft tissues of the right orbit. The globe is intact. 4. Mild anterior subluxation of the right temporomandibular joint. No dislocation or fracture. 5. Chronic asymmetry of the left globe without acute injury. 6. Degenerative changes in the cervical spine. No evidence for acute cervical spine abnormality. 7. Incidental note of right thyroid nodule with benign features. No further evaluation is felt to be necessary based on consensus criteria. 8. Carotid calcifications.   Electronically Signed   By: Norva Pavlov M.D.   On: 03/19/2015 16:36      Filed Weights   03/21/15 0415 03/22/15 0513 03/24/15 0643  Weight: 61.553 kg (135 lb 11.2 oz) 60.89 kg (134 lb 3.8 oz) 59.8 kg (131 lb 13.4 oz)     Microbiology: Recent Results (from the past 240 hour(s))  Urine culture     Status: None   Collection Time: 03/19/15  4:00 PM  Result Value Ref Range Status   Specimen Description URINE, CATHETERIZED  Final   Special Requests NONE  Final   Culture   Final    NO GROWTH 1 DAY Performed at [facility]    Report Status 03/20/2015 FINAL  Final  C difficile quick scan w PCR reflex     Status: None   Collection Time: 03/20/15  2:04 AM  Result Value Ref Range Status   C Diff antigen NEGATIVE NEGATIVE Final   C Diff toxin NEGATIVE NEGATIVE Final   C Diff interpretation Negative for toxigenic C. difficile  Final       Blood Culture    Component Value Date/Time    SDES URINE, CATHETERIZED 03/19/2015 1600   SPECREQUEST NONE 03/19/2015 1600   CULT  03/19/2015 1600    NO GROWTH 1 DAY Performed at [facility]    REPTSTATUS 03/20/2015 FINAL 03/19/2015 1600      Labs: Results for orders placed or performed during the hospital encounter of 03/19/15 (from the past 48 hour(s))  Comprehensive metabolic panel     Status: Abnormal   Collection Time: 03/22/15 10:42 AM  Result Value Ref Range   Sodium 131 (L) 135 - 145 mmol/L   Potassium 4.5 3.5 - 5.1 mmol/L    Comment: DELTA CHECK NOTED REPEATED TO VERIFY NO VISIBLE HEMOLYSIS    Chloride 107 101 - 111 mmol/L   CO2 17 (L) 22 - 32 mmol/L   Glucose, Bld 178 (H) 65 - 99 mg/dL   BUN 40 (H) 6 - 20 mg/dL    Comment: REPEATED TO VERIFY   Creatinine, Ser 1.68 (H) 0.44 - 1.00 mg/dL    Comment: DELTA CHECK NOTED REPEATED TO VERIFY    Calcium 7.9 (L) 8.9 - 10.3 mg/dL   Total Protein 5.7 (L) 6.5 - 8.1 g/dL   Albumin 3.4 (L) 3.5 - 5.0 g/dL   AST 31 15 - 41 U/L   ALT 27 14 - 54 U/L   Alkaline Phosphatase 48 38 - 126 U/L  Total Bilirubin 0.4 0.3 - 1.2 mg/dL   GFR calc non Af Amer 27 (L) >60 mL/min   GFR calc Af Amer 31 (L) >60 mL/min    Comment: (NOTE) The eGFR has been calculated using the CKD EPI equation. This calculation has not been validated in all clinical situations. eGFR's persistently <60 mL/min signify possible Chronic Kidney Disease.    Anion gap 7 5 - 15  CBC     Status: Abnormal   Collection Time: 03/22/15 10:42 AM  Result Value Ref Range   WBC 18.4 (H) 4.0 - 10.5 K/uL   RBC 4.01 3.87 - 5.11 MIL/uL   Hemoglobin 13.2 12.0 - 15.0 g/dL   HCT 06.9 91.0 - 79.1 %   MCV 95.3 78.0 - 100.0 fL   MCH 32.9 26.0 - 34.0 pg   MCHC 34.6 30.0 - 36.0 g/dL   RDW 77.7 60.7 - 06.8 %   Platelets 274 150 - 400 K/uL  Troponin I     Status: Abnormal   Collection Time: 03/22/15 10:42 AM  Result Value Ref Range   Troponin I 0.09 (H) <0.031 ng/mL    Comment:        PERSISTENTLY INCREASED  TROPONIN VALUES IN THE RANGE OF 0.04-0.49 ng/mL CAN BE SEEN IN:       -UNSTABLE ANGINA       -CONGESTIVE HEART FAILURE       -MYOCARDITIS       -CHEST TRAUMA       -ARRYHTHMIAS       -LATE PRESENTING MYOCARDIAL INFARCTION       -COPD   CLINICAL FOLLOW-UP RECOMMENDED.   Differential     Status: Abnormal   Collection Time: 03/23/15 11:06 AM  Result Value Ref Range   Neutrophils Relative % 80 %   Neutro Abs 9.5 (H) 1.7 - 7.7 K/uL   Lymphocytes Relative 11 %   Lymphs Abs 1.4 0.7 - 4.0 K/uL   Monocytes Relative 9 %   Monocytes Absolute 1.1 (H) 0.1 - 1.0 K/uL   Eosinophils Relative 0 %   Eosinophils Absolute 0.0 0.0 - 0.7 K/uL   Basophils Relative 0 %   Basophils Absolute 0.0 0.0 - 0.1 K/uL  Comprehensive metabolic panel     Status: Abnormal   Collection Time: 03/23/15 11:06 AM  Result Value Ref Range   Sodium 133 (L) 135 - 145 mmol/L   Potassium 4.0 3.5 - 5.1 mmol/L   Chloride 110 101 - 111 mmol/L   CO2 14 (L) 22 - 32 mmol/L   Glucose, Bld 182 (H) 65 - 99 mg/dL   BUN 30 (H) 6 - 20 mg/dL   Creatinine, Ser 2.09 (H) 0.44 - 1.00 mg/dL   Calcium 8.2 (L) 8.9 - 10.3 mg/dL   Total Protein 5.3 (L) 6.5 - 8.1 g/dL   Albumin 3.1 (L) 3.5 - 5.0 g/dL   AST 39 15 - 41 U/L   ALT 54 14 - 54 U/L   Alkaline Phosphatase 42 38 - 126 U/L   Total Bilirubin 0.4 0.3 - 1.2 mg/dL   GFR calc non Af Amer 33 (L) >60 mL/min   GFR calc Af Amer 38 (L) >60 mL/min    Comment: (NOTE) The eGFR has been calculated using the CKD EPI equation. This calculation has not been validated in all clinical situations. eGFR's persistently <60 mL/min signify possible Chronic Kidney Disease.    Anion gap 9 5 - 15  CBC     Status: Abnormal  Collection Time: 03/23/15 11:06 AM  Result Value Ref Range   WBC 12.0 (H) 4.0 - 10.5 K/uL   RBC 3.82 (L) 3.87 - 5.11 MIL/uL   Hemoglobin 12.7 12.0 - 15.0 g/dL   HCT 37.8 36.0 - 46.0 %   MCV 99.0 78.0 - 100.0 fL   MCH 33.2 26.0 - 34.0 pg   MCHC 33.6 30.0 - 36.0 g/dL   RDW 12.7  11.5 - 15.5 %   Platelets 178 150 - 400 K/uL  Troponin I     Status: Abnormal   Collection Time: 03/23/15  6:34 PM  Result Value Ref Range   Troponin I 0.06 (H) <0.031 ng/mL    Comment:        PERSISTENTLY INCREASED TROPONIN VALUES IN THE RANGE OF 0.04-0.49 ng/mL CAN BE SEEN IN:       -UNSTABLE ANGINA       -CONGESTIVE HEART FAILURE       -MYOCARDITIS       -CHEST TRAUMA       -ARRYHTHMIAS       -LATE PRESENTING MYOCARDIAL INFARCTION       -COPD   CLINICAL FOLLOW-UP RECOMMENDED.   CBC     Status: Abnormal   Collection Time: 03/24/15  6:15 AM  Result Value Ref Range   WBC 9.1 4.0 - 10.5 K/uL    Comment: WHITE COUNT CONFIRMED ON SMEAR   RBC 3.51 (L) 3.87 - 5.11 MIL/uL   Hemoglobin 11.7 (L) 12.0 - 15.0 g/dL   HCT 33.8 (L) 36.0 - 46.0 %   MCV 96.3 78.0 - 100.0 fL   MCH 33.3 26.0 - 34.0 pg   MCHC 34.6 30.0 - 36.0 g/dL   RDW 12.6 11.5 - 15.5 %   Platelets 185 150 - 400 K/uL     Lipid Panel     Component Value Date/Time   CHOL 159 03/01/2013 0903   TRIG 205.0* 03/01/2013 0903   HDL 44.30 03/01/2013 0903   CHOLHDL 4 03/01/2013 0903   VLDL 41.0* 03/01/2013 0903   LDLCALC 92 11/06/2008 0803   LDLDIRECT 91.5 03/01/2013 0903     Lab Results  Component Value Date   HGBA1C 6.4 03/01/2013   HGBA1C 6.0 11/08/2012   HGBA1C 6.5 01/06/2011     Lab Results  Component Value Date   MICROALBUR 1.7 11/06/2008   LDLCALC 92 11/06/2008   CREATININE 1.44* 03/23/2015     HPI :*81 y.o. 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 In the ED, evaluation revealed mildly elevated blood pressures, low-grade temperature, creatinine 4.3, WBC 16.7, negative troponin and CK 1 and extensive imaging with chest x-ray, CT  head, CT cervical spine, x-ray left shoulder, CT maxillofacial only significant for mild anterior subluxation of right TMJ. Hospitalist admission was requested.  HOSPITAL COURSE: *  Acute kidney injury-prerenal - Baseline creatinine 0.95 on 01/30/15. Admitted with creatinine of 4.3, now 1.44, improving - Multifactorial secondary to dehydration from GI losses, poor oral intake and meds (ARB +/- NSAIDs) - Hold culprit medications , continue IV normal saline hydration,  Renal ultrasound without hydronephrosis , nephrology has signed off   Hypokalemia-repleted  Abnormal troponin-likely demand ischemia in the setting of renal insufficiency, continue to cycle cardiac enzymes and if remains stable  no further workup indicated, resume Plavix, 2-D echo shows EF of 55-60%, no gross regional wall motion abnormalities, grade 2 diastolic dysfunction   Fall and right facial trauma and other superficial trauma/ataxia/peripheral neuropathy - History of gait ataxia for a few months (sees outpatient [facility]) and falls at home - No significant fractures or dislocation on imaging studies patient has multiple bruises on her upper extremities including a facial bruise, patient was taking Plavix at home  - PT and OT evaluation recommended SNF CT head negative for acute intracranial abnormality    Probable Duodenitis? Nausea, vomiting and diarrhea CT of the abdomen and pelvis was done yesterday without contrast that showed circumferential thickening of the duodenum with periduodenal mesenteric stranding suggestive of infectious versus inflammatory changes C. difficile negative, patient started on ciprofloxacin and Flagyl IV Patient will need outpatient GI workup/EGD /colonoscopy being contemplated.  Dehydration - Continue IV fluids for another day  Essential hypertension -continue medications as above  HLD - Possibly on statins at home.  Mild anterior subluxation of right TMJ - Asymptomatic  at this time. Monitor  Right thyroid nodule - Seen on CT and reported with benign features and radiologist recommends no further evaluation based on consensus criteria     Discharge Exam: *   Blood pressure 166/72, pulse 76, temperature 98.6 F (37 C), temperature source Oral, resp. rate 18, height $RemoveBe'5\' 3"'CwnzjdIts$  (1.6 m), weight 59.8 kg (131 lb 13.4 oz), SpO2 98 %.   General exam: Pleasant elderly female lying comfortably in bed. Extensive right facial bruising and infraorbital hematoma. (Picture in H&P) Respiratory system: Clear. No increased work of breathing. Cardiovascular system: S1 & S2 heard, RRR. No JVD, murmurs, gallops, clicks or pedal edema. Gastrointestinal system: Abdomen is nondistended, soft and nontender. Normal bowel sounds heard. Central nervous system: Alert and oriented. No focal neurological deficits. Extremities: Symmetric 5 x 5 power.       Discharge Instructions    Diet - low sodium heart healthy    Complete by:  As directed      Increase activity slowly    Complete by:  As directed              Signed: ABROL,NAYANA 03/24/2015, 9:09 AM        Time spent >45 mins

## 2015-03-24 NOTE — Clinical Social Work Placement (Signed)
   CLINICAL SOCIAL WORK PLACEMENT  NOTE  Date:  03/24/2015  Patient Details  Name: Sandra [name] MRN: [id number] Date of Birth: [birth year]  Clinical Social Work is seeking post-discharge placement for this patient at the [place] level of care (*CSW will initial, date and re-position this form in  chart as items are completed):  Yes   Patient/family provided with [facility] Work Department's list of facilities offering this level of care within the geographic area requested by the patient (or if unable, by the patient's family).  Yes   Patient/family informed of their freedom to choose among providers that offer the needed level of care, that participate in Medicare, Medicaid or managed care program needed by the patient, have an available bed and are willing to accept the patient.  Yes   Patient/family informed of [facility]'s ownership interest in [facility] and [facility], as well as of the fact that they are under no obligation to receive care at these facilities.  PASRR submitted to EDS on 03/21/15     PASRR number received on 03/21/15     Existing PASRR number confirmed on       FL2 transmitted to all facilities in geographic area requested by pt/family on 03/21/15     FL2 transmitted to all facilities within larger geographic area on       Patient informed that his/her managed care company has contracts with or will negotiate with certain facilities, including the following:        Yes   Patient/family informed of bed offers received.  Patient chooses bed at [facility], [place]     Physician recommends and patient chooses bed at      Patient to be transferred to [facility], [place] on  March 24, 2015.  Patient to be transferred to facility by    ambulance   Patient family notified on   March 24, 2015 of transfer.  Name of family member notified:    Sue/daughter    PHYSICIAN       Additional Comment:     _______________________________________________ Carlean Jews, LCSW 03/24/2015, 4:38 PM

## 2015-03-26 ENCOUNTER — Telehealth: Payer: Self-pay | Admitting: *Deleted

## 2015-03-26 NOTE — Telephone Encounter (Signed)
Pt was on tcm list d/c 03/24/15 pt had fallen and had right facial trauma. Pt has been sent to SNF...Sandra [name]

## 2015-03-28 ENCOUNTER — Telehealth: Payer: Self-pay | Admitting: Internal Medicine

## 2015-03-28 NOTE — Telephone Encounter (Signed)
Sandra [name] should be taking Plavix according to the d/c summary Thx

## 2015-03-28 NOTE — Telephone Encounter (Signed)
pts daughter called and pt had a bad fall and has been in the hospital and is now at [facility] for about 3 wks. Hospital put her back on clopidogrel (PLAVIX) 75 MG tablet [[id number]  And she is wanting to know if she should be on it and she is not sure if the nursing home will be giving it to her. Please call daughter [name] at  [phone]

## 2015-03-29 NOTE — Telephone Encounter (Signed)
LVM for pt to call back as soon as possible.   

## 2015-04-09 ENCOUNTER — Inpatient Hospital Stay: Payer: Medicare Other | Admitting: Internal Medicine

## 2015-05-29 ENCOUNTER — Ambulatory Visit (INDEPENDENT_AMBULATORY_CARE_PROVIDER_SITE_OTHER): Payer: Medicare Other | Admitting: Internal Medicine

## 2015-05-29 ENCOUNTER — Other Ambulatory Visit (INDEPENDENT_AMBULATORY_CARE_PROVIDER_SITE_OTHER): Payer: Medicare Other

## 2015-05-29 ENCOUNTER — Encounter: Payer: Self-pay | Admitting: Internal Medicine

## 2015-05-29 VITALS — BP 170/100 | HR 82 | Wt 135.0 lb

## 2015-05-29 DIAGNOSIS — E538 Deficiency of other specified B group vitamins: Secondary | ICD-10-CM

## 2015-05-29 DIAGNOSIS — G500 Trigeminal neuralgia: Secondary | ICD-10-CM | POA: Diagnosis not present

## 2015-05-29 DIAGNOSIS — I10 Essential (primary) hypertension: Secondary | ICD-10-CM

## 2015-05-29 DIAGNOSIS — N179 Acute kidney failure, unspecified: Secondary | ICD-10-CM

## 2015-05-29 DIAGNOSIS — E559 Vitamin D deficiency, unspecified: Secondary | ICD-10-CM

## 2015-05-29 DIAGNOSIS — R260 Ataxic gait: Secondary | ICD-10-CM

## 2015-05-29 LAB — CBC WITH DIFFERENTIAL/PLATELET
BASOS ABS: 0 10*3/uL (ref 0.0–0.1)
BASOS PCT: 0.4 % (ref 0.0–3.0)
Eosinophils Absolute: 0 10*3/uL (ref 0.0–0.7)
Eosinophils Relative: 0.1 % (ref 0.0–5.0)
HCT: 42.8 % (ref 36.0–46.0)
Hemoglobin: 14.2 g/dL (ref 12.0–15.0)
LYMPHS ABS: 2.1 10*3/uL (ref 0.7–4.0)
LYMPHS PCT: 20.7 % (ref 12.0–46.0)
MCHC: 33.3 g/dL (ref 30.0–36.0)
MCV: 95.7 fl (ref 78.0–100.0)
MONOS PCT: 7.1 % (ref 3.0–12.0)
Monocytes Absolute: 0.7 10*3/uL (ref 0.1–1.0)
NEUTROS ABS: 7.4 10*3/uL (ref 1.4–7.7)
Neutrophils Relative %: 71.7 % (ref 43.0–77.0)
Platelets: 266 10*3/uL (ref 150.0–400.0)
RBC: 4.47 Mil/uL (ref 3.87–5.11)
RDW: 13.5 % (ref 11.5–15.5)
WBC: 10.3 10*3/uL (ref 4.0–10.5)

## 2015-05-29 LAB — BASIC METABOLIC PANEL
BUN: 14 mg/dL (ref 6–23)
CALCIUM: 9.7 mg/dL (ref 8.4–10.5)
CHLORIDE: 102 meq/L (ref 96–112)
CO2: 29 meq/L (ref 19–32)
CREATININE: 0.82 mg/dL (ref 0.40–1.20)
GFR: 70.59 mL/min (ref >60.00)
GLUCOSE: 164 mg/dL — AB (ref 70–99)
Potassium: 3.9 mEq/L (ref 3.5–5.1)
SODIUM: 141 meq/L (ref 135–145)

## 2015-05-29 MED ORDER — ZALEPLON 10 MG PO CAPS: 10.0000 mg | ORAL_CAPSULE | Freq: Every evening | ORAL | Status: DC | PRN

## 2015-05-29 MED ORDER — AMLODIPINE BESYLATE 10 MG PO TABS: 5.0000 mg | ORAL_TABLET | Freq: Every day | ORAL | Status: DC

## 2015-05-29 MED ORDER — TEMAZEPAM 30 MG PO CAPS: 30.0000 mg | ORAL_CAPSULE | Freq: Every evening | ORAL | Status: DC | PRN

## 2015-05-29 NOTE — Assessment & Plan Note (Signed)
On Vit D 

## 2015-05-29 NOTE — Progress Notes (Signed)
Pre visit review using our clinic review tool, if applicable. No additional management support is needed unless otherwise documented below in the visit note. 

## 2015-05-29 NOTE — Assessment & Plan Note (Signed)
Resolved

## 2015-05-29 NOTE — Assessment & Plan Note (Signed)
On B12 

## 2015-05-29 NOTE — Assessment & Plan Note (Signed)
Better  

## 2015-05-29 NOTE — Assessment & Plan Note (Signed)
Norvasc, Atenolol, Catapress 

## 2015-05-29 NOTE — Assessment & Plan Note (Signed)
Labs

## 2015-05-29 NOTE — Progress Notes (Signed)
Subjective:  Patient ID: Sandra [name], female    DOB: 1928-09-21  Age: 81 y.o. MRN: [id number]  CC: No chief complaint on file.   HPI BEKI GREASON presents for a SNF stay at [facility] f/u. F/u ARF, HTN, HAs  Hx:  Admit date: 03/19/2015 Discharge date: 03/24/2015  Discharge Diagnoses:    Principal Problem:  AKI (acute kidney injury) Active Problems:  Dyslipidemia  Essential hypertension  Ataxia  Peripheral neuropathy  Fall at home  Facial trauma-right side  Nausea vomiting and diarrhea  Dehydration  Acute kidney injury  Duodenitis without bleeding    Follow-up recommendations Follow-up with PCP in 3-5 days , including all additional recommended appointments as below Follow-up CBC, CMP in 3-5 days Patient would need outpatient GI evaluation with EGD and colonoscopy Patient to continue on a soft heart healthy diet     Medication List    TAKE these medications       ALPRAZolam 0.5 MG tablet  Commonly known as: XANAX  Take 1 tablet (0.5 mg total) by mouth 3 (three) times daily as needed for anxiety.     amLODipine 10 MG tablet  Commonly known as: NORVASC  Take 1 tablet (10 mg total) by mouth daily.     atenolol 100 MG tablet  Commonly known as: TENORMIN  Take 1 tablet (100 mg total) by mouth 2 (two) times daily.     cholecalciferol 1000 UNITS tablet  Commonly known as: VITAMIN D  Take 1,000 Units by mouth daily.     ciprofloxacin 500 MG tablet  Commonly known as: CIPRO  Take 1 tablet (500 mg total) by mouth 2 (two) times daily.     cloNIDine 0.1 MG tablet  Commonly known as: CATAPRES  Take 1 tablet (0.1 mg total) by mouth 2 (two) times daily. For sweating and for the blood pressure.     clopidogrel 75 MG tablet  Commonly known as: PLAVIX  Take 75 mg by mouth daily.     feeding supplement (ENSURE ENLIVE) Liqd  Take 237 mLs by mouth 2 (two) times daily between meals.      gabapentin 100 MG capsule  Commonly known as: NEURONTIN  Take 100 mg by mouth 3 (three) times daily.     lubiprostone 24 MCG capsule  Commonly known as: AMITIZA  Take 1 capsule (24 mcg total) by mouth 2 (two) times daily with a meal.     metroNIDAZOLE 500 MG tablet  Commonly known as: FLAGYL  Take 1 tablet (500 mg total) by mouth 3 (three) times daily.     oxyCODONE 5 MG immediate release tablet  Commonly known as: Oxy IR/ROXICODONE  Take 1 tablet (5 mg total) by mouth every 6 (six) hours as needed for severe pain.     pantoprazole 40 MG tablet  Commonly known as: PROTONIX  Take 1 tablet (40 mg total) by mouth daily.     QUEtiapine 100 MG tablet  Commonly known as: SEROQUEL  Take 100-200 mg by mouth at bedtime as needed (insomnia).     simvastatin 40 MG tablet  Commonly known as: ZOCOR  Take 1 tablet (40 mg total) by mouth at bedtime.     temazepam 30 MG capsule  Commonly known as: RESTORIL  TAKE 2 CAPSULES AT BEDTIME AS NEEDED FOR SLEEP     traMADol 50 MG tablet  Commonly known as: ULTRAM  Take 50-100 mg by mouth 2 (two) times daily as needed for moderate pain.     vitamin  B-12 1000 MCG tablet  Commonly known as: CYANOCOBALAMIN  Take 1,000 mcg by mouth daily.     vitamin C 100 MG tablet  Take 100 mg by mouth daily.         Discharge Condition: *Stable  Disposition: SNF"        Outpatient Prescriptions Prior to Visit  Medication Sig Dispense Refill  . Ascorbic Acid (VITAMIN C) 100 MG tablet Take 100 mg by mouth daily.    Marland Kitchen atenolol (TENORMIN) 100 MG tablet Take 1 tablet (100 mg total) by mouth 2 (two) times daily. 180 tablet 3  . Cholecalciferol (VITAMIN D3) 1000 UNITS tablet Take 1,000 Units by mouth daily.      . cloNIDine (CATAPRES) 0.1 MG tablet Take 1 tablet (0.1 mg total) by mouth 2 (two) times daily. For sweating and for the blood pressure. 180 tablet 3  . clopidogrel (PLAVIX) 75 MG  tablet Take 75 mg by mouth daily.    . traMADol (ULTRAM) 50 MG tablet Take 50-100 mg by mouth 2 (two) times daily as needed for moderate pain.   1  . vitamin B-12 (CYANOCOBALAMIN) 1000 MCG tablet Take 1,000 mcg by mouth daily.      Marland Kitchen ALPRAZolam (XANAX) 0.5 MG tablet Take 1 tablet (0.5 mg total) by mouth 3 (three) times daily as needed for anxiety. 30 tablet 0  . feeding supplement, ENSURE ENLIVE, (ENSURE ENLIVE) LIQD Take 237 mLs by mouth 2 (two) times daily between meals. 237 mL 12  . gabapentin (NEURONTIN) 100 MG capsule Take 100 mg by mouth 3 (three) times daily.  0  . lubiprostone (AMITIZA) 24 MCG capsule Take 1 capsule (24 mcg total) by mouth 2 (two) times daily with a meal. 60 capsule 5  . metroNIDAZOLE (FLAGYL) 500 MG tablet Take 1 tablet (500 mg total) by mouth 3 (three) times daily. 21 tablet 0  . oxyCODONE (OXY IR/ROXICODONE) 5 MG immediate release tablet Take 1 tablet (5 mg total) by mouth every 6 (six) hours as needed for severe pain. 30 tablet 0  . pantoprazole (PROTONIX) 40 MG tablet Take 1 tablet (40 mg total) by mouth daily. 30 tablet 1  . QUEtiapine (SEROQUEL) 100 MG tablet Take 100-200 mg by mouth at bedtime as needed (insomnia).    . simvastatin (ZOCOR) 40 MG tablet Take 1 tablet (40 mg total) by mouth at bedtime. 90 tablet 3  . temazepam (RESTORIL) 30 MG capsule TAKE 2 CAPSULES AT BEDTIME AS NEEDED FOR SLEEP  5  . amLODipine (NORVASC) 10 MG tablet Take 1 tablet (10 mg total) by mouth daily. (Patient not taking: Reported on 05/29/2015) 30 tablet 1  . ciprofloxacin (CIPRO) 500 MG tablet Take 1 tablet (500 mg total) by mouth 2 (two) times daily. (Patient not taking: Reported on 05/29/2015) 14 tablet 0   No facility-administered medications prior to visit.    ROS Review of Systems  Constitutional: Positive for fatigue. Negative for chills, activity change, appetite change and unexpected weight change.  HENT: Negative for congestion, mouth sores and sinus pressure.   Eyes:  Negative for visual disturbance.  Respiratory: Negative for cough and chest tightness.   Gastrointestinal: Negative for nausea and abdominal pain.  Genitourinary: Negative for frequency, difficulty urinating and vaginal pain.  Musculoskeletal: Negative for back pain and gait problem.  Skin: Negative for pallor and rash.  Neurological: Negative for dizziness, tremors, weakness, numbness and headaches.  Psychiatric/Behavioral: Positive for sleep disturbance and decreased concentration. Negative for confusion. The patient is not nervous/anxious.  Objective:  BP 170/100 mmHg  Pulse 82  Wt 135 lb (61.236 kg)  SpO2 96%  BP Readings from Last 3 Encounters:  05/29/15 170/100  03/24/15 166/72  02/14/15 190/94    Wt Readings from Last 3 Encounters:  05/29/15 135 lb (61.236 kg)  03/24/15 131 lb 13.4 oz (59.8 kg)  02/14/15 139 lb (63.05 kg)    Physical Exam  Constitutional: She is oriented to person, place, and time. She appears well-developed. No distress.  HENT:  Head: Normocephalic.  Right Ear: External ear normal.  Left Ear: External ear normal.  Nose: Nose normal.  Mouth/Throat: Oropharynx is clear and moist.  Eyes: Conjunctivae are normal. Pupils are equal, round, and reactive to light. Right eye exhibits no discharge. Left eye exhibits no discharge.  Neck: Normal range of motion. Neck supple. No JVD present. No tracheal deviation present. No thyromegaly present.  Cardiovascular: Normal rate, regular rhythm and normal heart sounds.   Pulmonary/Chest: No stridor. No respiratory distress. She has no wheezes.  Abdominal: Soft. Bowel sounds are normal. She exhibits no distension and no mass. There is no tenderness. There is no rebound and no guarding.  Musculoskeletal: Normal range of motion. She exhibits no edema or tenderness.  Lymphadenopathy:    She has no cervical adenopathy.  Neurological: She is alert and oriented to person, place, and time. She displays normal reflexes.  No cranial nerve deficit. She exhibits normal muscle tone. Coordination abnormal.  Skin: No rash noted. No erythema.  Psychiatric: She has a normal mood and affect. Her behavior is normal. Judgment and thought content normal.  head NT Slightly ataxic  Lab Results  Component Value Date   WBC 9.1 03/24/2015   HGB 11.7* 03/24/2015   HCT 33.8* 03/24/2015   PLT 185 03/24/2015   GLUCOSE 182* 03/23/2015   CHOL 159 03/01/2013   TRIG 205.0* 03/01/2013   HDL 44.30 03/01/2013   LDLDIRECT 91.5 03/01/2013   LDLCALC 92 11/06/2008   ALT 54 03/23/2015   AST 39 03/23/2015   NA 133* 03/23/2015   K 4.0 03/23/2015   CL 110 03/23/2015   CREATININE 1.44* 03/23/2015   BUN 30* 03/23/2015   CO2 14* 03/23/2015   TSH 3.51 01/30/2015   INR 1.1* 03/01/2013   HGBA1C 6.4 03/01/2013   MICROALBUR 1.7 11/06/2008    Dg Chest 2 View  03/19/2015  CLINICAL DATA:  Fall sometime in the last 2 days. Left shoulder pain. Patient found with swelling of the face. Vomiting. EXAM: CHEST  2 VIEW COMPARISON:  03/09/2012 FINDINGS: Atherosclerotic aortic arch. Heart size within normal limits. Stable prominence of the epicardial adipose tissue. No pneumothorax or pleural effusion. Mild thoracic spondylosis. IMPRESSION: 1. No acute thoracic findings. 2. Atherosclerotic aortic arch. Electronically Signed   By: Van Clines M.D.   On: 03/19/2015 16:30   Dg Shoulder Right  03/19/2015  CLINICAL DATA:  Patient lives at home alone and fell some time over the past 2 days. Patient's family had been trying to call with no answer. When they arrived from out of town the patient was in bed with the right side of her face badly swollen and bruised. Blood was found all over the bathroom. Patient had fallen gotten into bed and was asleep when her family arrived. There was vomit found in the patient's bed. Patient is alert and oriented at this time, but has some loss of memory about the fall. EXAM: RIGHT SHOULDER - 2+ VIEW COMPARISON:  None.  FINDINGS: No fracture or dislocation.  Mild AC joint osteoarthritis. Glenohumeral joint is relatively well maintained. Bones are demineralized. Soft tissues are unremarkable. IMPRESSION: No fracture or dislocation. Electronically Signed   By: Lajean Manes M.D.   On: 03/19/2015 19:47   Ct Head Wo Contrast  03/19/2015  CLINICAL DATA:  Patient lives at home alone and fell some time over the past 2 days. Patient's family had been trying to call with no answer. When they arrived from out of town the patient was in bed with the right side of her face badly swollen and bruised. Blood was found all over the bathroom. Patient had fallen gotten into bed and was asleep when her family arrived. There was vomit found in the patient's bed. Patient is alert and oriented at this time, but has some loss of memory about the fall. Pt has hypertension. EXAM: CT HEAD WITHOUT CONTRAST CT MAXILLOFACIAL WITHOUT CONTRAST CT CERVICAL SPINE WITHOUT CONTRAST TECHNIQUE: Multidetector CT imaging of the head, cervical spine, and maxillofacial structures were performed using the standard protocol without intravenous contrast. Multiplanar CT image reconstructions of the cervical spine and maxillofacial structures were also generated. COMPARISON:  MRI and 02/08/2015 FINDINGS: CT HEAD FINDINGS There is central and cortical atrophy. Periventricular white matter changes are consistent with small vessel disease. There is no intra or extra-axial fluid collection or mass lesion. The basilar cisterns and ventricles have a normal appearance. There is no CT evidence for acute infarction or hemorrhage. Bone windows show soft tissue edema superficial to the right zygomatic arch and right orbit. No calvarial fracture identified. CT MAXILLOFACIAL FINDINGS There is moderate preseptal edema of the right orbit. The right globe is intact. There is significant soft tissue swelling superficial to the right zygomatic arch. A hematoma is seen superficial to the  lateral wall of the maxilla which measures 3.4 x 1.6 cm. Soft tissue swelling extends inferiorly to level of the mandible. There is mild anterior subluxation of the temporomandibular joint without frank subluxation or fracture. There is chronic asymmetry of the left globe without evidence for acute injury. The nasal bones, bony nasal septum, zygomatic arches, mandible and pterygoid plates are intact. No evidence for acute fracture of the sinus walls or orbits. CT CERVICAL SPINE FINDINGS There is significant degenerative disc disease of the cervical spine, with moderate narrowing and posterior osteophyte formation at C3-4, C5-6. There is no acute fracture or subluxation. Note is made of atherosclerotic calcification of the carotid arteries bilaterally. Incidental note is made of a right thyroid nodule, measuring 1.4 cm. IMPRESSION: 1. Atrophy and small vessel disease. 2.  No evidence for acute intracranial abnormality. 3. Significant soft tissue swelling involving the right side of the face extending from the right orbit inferiorly to the right aspect of the mandible. There is a moderate hematoma along the lateral wall of the right maxilla which measures 3.4 cm. Soft tissue swelling involves the preseptal soft tissues of the right orbit. The globe is intact. 4. Mild anterior subluxation of the right temporomandibular joint. No dislocation or fracture. 5. Chronic asymmetry of the left globe without acute injury. 6. Degenerative changes in the cervical spine. No evidence for acute cervical spine abnormality. 7. Incidental note of right thyroid nodule with benign features. No further evaluation is felt to be necessary based on consensus criteria. 8. Carotid calcifications. Electronically Signed   By: Nolon Nations M.D.   On: 03/19/2015 16:36   Ct Cervical Spine Wo Contrast  03/19/2015  CLINICAL DATA:  Patient lives at home alone and fell some  time over the past 2 days. Patient's family had been trying to call with  no answer. When they arrived from out of town the patient was in bed with the right side of her face badly swollen and bruised. Blood was found all over the bathroom. Patient had fallen gotten into bed and was asleep when her family arrived. There was vomit found in the patient's bed. Patient is alert and oriented at this time, but has some loss of memory about the fall. Pt has hypertension. EXAM: CT HEAD WITHOUT CONTRAST CT MAXILLOFACIAL WITHOUT CONTRAST CT CERVICAL SPINE WITHOUT CONTRAST TECHNIQUE: Multidetector CT imaging of the head, cervical spine, and maxillofacial structures were performed using the standard protocol without intravenous contrast. Multiplanar CT image reconstructions of the cervical spine and maxillofacial structures were also generated. COMPARISON:  MRI and 02/08/2015 FINDINGS: CT HEAD FINDINGS There is central and cortical atrophy. Periventricular white matter changes are consistent with small vessel disease. There is no intra or extra-axial fluid collection or mass lesion. The basilar cisterns and ventricles have a normal appearance. There is no CT evidence for acute infarction or hemorrhage. Bone windows show soft tissue edema superficial to the right zygomatic arch and right orbit. No calvarial fracture identified. CT MAXILLOFACIAL FINDINGS There is moderate preseptal edema of the right orbit. The right globe is intact. There is significant soft tissue swelling superficial to the right zygomatic arch. A hematoma is seen superficial to the lateral wall of the maxilla which measures 3.4 x 1.6 cm. Soft tissue swelling extends inferiorly to level of the mandible. There is mild anterior subluxation of the temporomandibular joint without frank subluxation or fracture. There is chronic asymmetry of the left globe without evidence for acute injury. The nasal bones, bony nasal septum, zygomatic arches, mandible and pterygoid plates are intact. No evidence for acute fracture of the sinus walls or  orbits. CT CERVICAL SPINE FINDINGS There is significant degenerative disc disease of the cervical spine, with moderate narrowing and posterior osteophyte formation at C3-4, C5-6. There is no acute fracture or subluxation. Note is made of atherosclerotic calcification of the carotid arteries bilaterally. Incidental note is made of a right thyroid nodule, measuring 1.4 cm. IMPRESSION: 1. Atrophy and small vessel disease. 2.  No evidence for acute intracranial abnormality. 3. Significant soft tissue swelling involving the right side of the face extending from the right orbit inferiorly to the right aspect of the mandible. There is a moderate hematoma along the lateral wall of the right maxilla which measures 3.4 cm. Soft tissue swelling involves the preseptal soft tissues of the right orbit. The globe is intact. 4. Mild anterior subluxation of the right temporomandibular joint. No dislocation or fracture. 5. Chronic asymmetry of the left globe without acute injury. 6. Degenerative changes in the cervical spine. No evidence for acute cervical spine abnormality. 7. Incidental note of right thyroid nodule with benign features. No further evaluation is felt to be necessary based on consensus criteria. 8. Carotid calcifications. Electronically Signed   By: Nolon Nations M.D.   On: 03/19/2015 16:36   US Renal  03/20/2015  CLINICAL DATA:  Acute kidney insufficiency. EXAM: RENAL / URINARY TRACT ULTRASOUND COMPLETE COMPARISON:  None. FINDINGS: Right Kidney: Length: 11.5 cm. Echogenicity within normal limits. No mass or hydronephrosis visualized. Left Kidney: Length: 11.4 cm. Echogenicity within normal limits. In the lower pole left kidney, there is a 1.3 x 1.1 x 1.4 cm cyst with peripheral calcifications. No hydronephrosis visualized. Bladder: Appears normal for degree of bladder  distention. IMPRESSION: No acute abnormality. 1.4 cm cyst in lower pole left kidney with peripheral calcifications. Electronically Signed   By:  Abelardo Diesel M.D.   On: 03/20/2015 10:34   Dg Shoulder Left  03/19/2015  CLINICAL DATA:  Fall recently with left shoulder pain. Initial encounter. EXAM: LEFT SHOULDER - 2+ VIEW COMPARISON:  None. FINDINGS: No acute fracture or dislocation is identified. There are moderate degenerative changes involving the glenohumeral joint and AC joint. Cystic changes are seen in the lateral humeral head. No bony destruction identified. Soft tissues are unremarkable. IMPRESSION: No acute fracture. Moderate degenerative changes present involving the glenohumeral joint and AC joint. Electronically Signed   By: Aletta Edouard M.D.   On: 03/19/2015 16:34   Ct Maxillofacial Wo Cm  03/19/2015  CLINICAL DATA:  Patient lives at home alone and fell some time over the past 2 days. Patient's family had been trying to call with no answer. When they arrived from out of town the patient was in bed with the right side of her face badly swollen and bruised. Blood was found all over the bathroom. Patient had fallen gotten into bed and was asleep when her family arrived. There was vomit found in the patient's bed. Patient is alert and oriented at this time, but has some loss of memory about the fall. Pt has hypertension. EXAM: CT HEAD WITHOUT CONTRAST CT MAXILLOFACIAL WITHOUT CONTRAST CT CERVICAL SPINE WITHOUT CONTRAST TECHNIQUE: Multidetector CT imaging of the head, cervical spine, and maxillofacial structures were performed using the standard protocol without intravenous contrast. Multiplanar CT image reconstructions of the cervical spine and maxillofacial structures were also generated. COMPARISON:  MRI and 02/08/2015 FINDINGS: CT HEAD FINDINGS There is central and cortical atrophy. Periventricular white matter changes are consistent with small vessel disease. There is no intra or extra-axial fluid collection or mass lesion. The basilar cisterns and ventricles have a normal appearance. There is no CT evidence for acute infarction or  hemorrhage. Bone windows show soft tissue edema superficial to the right zygomatic arch and right orbit. No calvarial fracture identified. CT MAXILLOFACIAL FINDINGS There is moderate preseptal edema of the right orbit. The right globe is intact. There is significant soft tissue swelling superficial to the right zygomatic arch. A hematoma is seen superficial to the lateral wall of the maxilla which measures 3.4 x 1.6 cm. Soft tissue swelling extends inferiorly to level of the mandible. There is mild anterior subluxation of the temporomandibular joint without frank subluxation or fracture. There is chronic asymmetry of the left globe without evidence for acute injury. The nasal bones, bony nasal septum, zygomatic arches, mandible and pterygoid plates are intact. No evidence for acute fracture of the sinus walls or orbits. CT CERVICAL SPINE FINDINGS There is significant degenerative disc disease of the cervical spine, with moderate narrowing and posterior osteophyte formation at C3-4, C5-6. There is no acute fracture or subluxation. Note is made of atherosclerotic calcification of the carotid arteries bilaterally. Incidental note is made of a right thyroid nodule, measuring 1.4 cm. IMPRESSION: 1. Atrophy and small vessel disease. 2.  No evidence for acute intracranial abnormality. 3. Significant soft tissue swelling involving the right side of the face extending from the right orbit inferiorly to the right aspect of the mandible. There is a moderate hematoma along the lateral wall of the right maxilla which measures 3.4 cm. Soft tissue swelling involves the preseptal soft tissues of the right orbit. The globe is intact. 4. Mild anterior subluxation of the right temporomandibular  joint. No dislocation or fracture. 5. Chronic asymmetry of the left globe without acute injury. 6. Degenerative changes in the cervical spine. No evidence for acute cervical spine abnormality. 7. Incidental note of right thyroid nodule with  benign features. No further evaluation is felt to be necessary based on consensus criteria. 8. Carotid calcifications. Electronically Signed   By: Nolon Nations M.D.   On: 03/19/2015 16:36    Assessment & Plan:   Diagnoses and all orders for this visit:  AKI (acute kidney injury) ([place]) -     Basic metabolic panel; Future -     CBC with Differential/Platelet; Future  Essential hypertension -     Basic metabolic panel; Future -     CBC with Differential/Platelet; Future  B12 deficiency -     Basic metabolic panel; Future -     CBC with Differential/Platelet; Future  Trigeminal neuralgia of right side of face -     Basic metabolic panel; Future -     CBC with Differential/Platelet; Future  Acute kidney injury ([place]) -     Basic metabolic panel; Future -     CBC with Differential/Platelet; Future  Vitamin D deficiency -     Basic metabolic panel; Future -     CBC with Differential/Platelet; Future  Staggering -     Basic metabolic panel; Future -     CBC with Differential/Platelet; Future  Other orders -     amLODipine (NORVASC) 10 MG tablet; Take 0.5 tablets (5 mg total) by mouth daily. -     Discontinue: temazepam (RESTORIL) 30 MG capsule; Take 1 capsule (30 mg total) by mouth at bedtime as needed for sleep. -     zaleplon (SONATA) 10 MG capsule; Take 1 capsule (10 mg total) by mouth at bedtime as needed for sleep.  I have discontinued Ms. Rorabaugh's simvastatin, lubiprostone, QUEtiapine, gabapentin, temazepam, ALPRAZolam, ciprofloxacin, feeding supplement (ENSURE ENLIVE), metroNIDAZOLE, oxyCODONE, pantoprazole, BELSOMRA, fluticasone, and temazepam. I have also changed her amLODipine. Additionally, I am having her start on zaleplon. Lastly, I am having her maintain her vitamin B-12, cholecalciferol, atenolol, cloNIDine, vitamin C, clopidogrel, and traMADol.  Meds ordered this encounter  Medications  . DISCONTD: BELSOMRA 10 MG TABS    Sig: Take 1 tablet by mouth at bedtime.      Refill:  0  . DISCONTD: fluticasone (FLONASE) 50 MCG/ACT nasal spray    Sig:     Refill:  0  . amLODipine (NORVASC) 10 MG tablet    Sig: Take 0.5 tablets (5 mg total) by mouth daily.    Dispense:  30 tablet    Refill:  11  . DISCONTD: temazepam (RESTORIL) 30 MG capsule    Sig: Take 1 capsule (30 mg total) by mouth at bedtime as needed for sleep.    Dispense:  30 capsule    Refill:  5  . zaleplon (SONATA) 10 MG capsule    Sig: Take 1 capsule (10 mg total) by mouth at bedtime as needed for sleep.    Dispense:  30 capsule    Refill:  5     Follow-up: Return in about 3 months (around 08/29/2015) for a follow-up visit.  Walker Kehr, MD

## 2015-06-29 ENCOUNTER — Telehealth: Payer: Self-pay | Admitting: *Deleted

## 2015-06-29 NOTE — Telephone Encounter (Signed)
Notified daughter with md advisement...Sandra [name]

## 2015-06-29 NOTE — Telephone Encounter (Signed)
Ok to go back on [employer]

## 2015-06-29 NOTE — Telephone Encounter (Signed)
Receive call daughter states mom just finish up the Temazepam and was suppose to start taking the Sonata 10mg . Before filling the sonata she is wanting to see would either one of these medications ok for her to take since she had some Seroquel 100 mg, Trazodone, or Belsoma. If neither one is safe she can filled the sonata...Johny Chess

## 2015-06-29 NOTE — Telephone Encounter (Signed)
No... At one point she was. You saw the pt on Nov 22, and was told to d/c Temazepam and start the [place], but  wanted to finish up the Temazepam. Daughter is asking would it be ok if she go back on either the Trazodone, Seroquel, or Belsoma since she have some at home already...Johny Chess

## 2015-06-29 NOTE — Telephone Encounter (Signed)
I do not understand the question: Is she taking Seroquel 100 mg, Trazodone, or Belsoma? Thx

## 2015-07-06 ENCOUNTER — Other Ambulatory Visit: Payer: Self-pay | Admitting: Internal Medicine

## 2015-07-20 ENCOUNTER — Telehealth: Payer: Self-pay

## 2015-07-20 MED ORDER — TRAZODONE HCL 150 MG PO TABS: 150.0000 mg | ORAL_TABLET | Freq: Every day | ORAL | Status: DC

## 2015-07-20 NOTE — Telephone Encounter (Signed)
Patients daughter Maurene Capes called. Patient is currently taking Sonata 10 mg daily. She states that it is not working and was wondering if patient can double the medication and take two per day if you will prescribe it. She was also stating that she had been prescribed two other medications for the same issue that had worked in the past. Please advise.

## 2015-07-20 NOTE — Telephone Encounter (Signed)
Stay on Sonata 10 mg/d Start Trazodone 150 mg at hs - emailed rx Thx

## 2015-07-26 ENCOUNTER — Telehealth: Payer: Self-pay

## 2015-07-26 NOTE — Telephone Encounter (Signed)
PA initiated via covermymeds for medication Sonata. Key for PA is [id number]

## 2015-07-30 ENCOUNTER — Telehealth: Payer: Self-pay | Admitting: *Deleted

## 2015-07-30 MED ORDER — DOXEPIN HCL 3 MG PO TABS: 1.0000 | ORAL_TABLET | Freq: Every day | ORAL | Status: DC

## 2015-07-30 NOTE — Telephone Encounter (Signed)
Called daughter back no answer LMOM with md response. Sent rx to [facility].../lmb

## 2015-07-30 NOTE — Telephone Encounter (Signed)
Silenor is ok - can add it if needed Thx

## 2015-07-30 NOTE — Telephone Encounter (Signed)
Receive call from pt daughter [name]) stating insurance will not cover the [place]. They sent a letter stating pt must have tried and fail alternative Silenor. Daughter states before md rx wanting to jnow can she go back on the generic Seroquel at bed time, or if he feel silenor is better would like rx...Johny Chess

## 2015-08-01 ENCOUNTER — Other Ambulatory Visit: Payer: Self-pay | Admitting: Internal Medicine

## 2015-08-07 NOTE — Telephone Encounter (Signed)
Pt never picked up rx. Voiding script...Sandra [name]

## 2015-08-27 ENCOUNTER — Ambulatory Visit (INDEPENDENT_AMBULATORY_CARE_PROVIDER_SITE_OTHER): Payer: Medicare Other | Admitting: Internal Medicine

## 2015-08-27 ENCOUNTER — Telehealth: Payer: Self-pay | Admitting: Internal Medicine

## 2015-08-27 ENCOUNTER — Encounter: Payer: Self-pay | Admitting: Internal Medicine

## 2015-08-27 VITALS — BP 184/88 | HR 71 | Wt 144.0 lb

## 2015-08-27 DIAGNOSIS — M25511 Pain in right shoulder: Secondary | ICD-10-CM

## 2015-08-27 DIAGNOSIS — F411 Generalized anxiety disorder: Secondary | ICD-10-CM

## 2015-08-27 DIAGNOSIS — I10 Essential (primary) hypertension: Secondary | ICD-10-CM

## 2015-08-27 DIAGNOSIS — M25519 Pain in unspecified shoulder: Secondary | ICD-10-CM | POA: Insufficient documentation

## 2015-08-27 MED ORDER — TRAMADOL HCL 50 MG PO TABS: 50.0000 mg | ORAL_TABLET | Freq: Two times a day (BID) | ORAL | Status: AC | PRN

## 2015-08-27 MED ORDER — DICLOFENAC SODIUM 50 MG PO TBEC: 50.0000 mg | DELAYED_RELEASE_TABLET | Freq: Two times a day (BID) | ORAL | Status: AC | PRN

## 2015-08-27 MED ORDER — CLONIDINE HCL 0.1 MG PO TABS: 0.1000 mg | ORAL_TABLET | Freq: Two times a day (BID) | ORAL | Status: DC

## 2015-08-27 MED ORDER — KETOROLAC TROMETHAMINE 30 MG/ML IJ SOLN
30.0000 mg | Freq: Once | INTRAMUSCULAR | Status: AC
  Administered 2015-08-27: 30 mg via INTRAMUSCULAR

## 2015-08-27 MED ORDER — CLOPIDOGREL BISULFATE 75 MG PO TABS: 75.0000 mg | ORAL_TABLET | Freq: Every day | ORAL | Status: DC

## 2015-08-27 NOTE — Telephone Encounter (Signed)
appt made

## 2015-08-27 NOTE — Assessment & Plan Note (Addendum)
Steroid shot - pt had a reaction to steroids 10 y ago - discussed and decided to try NSAID-diclofenac/ PPI - Nexium Diclofenac bid low dose Toradol IM given

## 2015-08-27 NOTE — Telephone Encounter (Signed)
Pt called in stating her right shoulder seems to be locking up on her and is wondering if you can fit her in today. Please advise

## 2015-08-27 NOTE — Telephone Encounter (Signed)
Ok 11:45 Thx

## 2015-08-27 NOTE — Progress Notes (Signed)
Pre visit review using our clinic review tool, if applicable. No additional management support is needed unless otherwise documented below in the visit note. 

## 2015-08-27 NOTE — Progress Notes (Signed)
Subjective:  Patient ID: Sandra [name], female    DOB: [birth year]  Age: 82 y.o. MRN: [id number]  CC: No chief complaint on file.   HPI Sandra [name] presents for R shoulder pain x 2 weeks - severe  Outpatient Prescriptions Prior to Visit  Medication Sig Dispense Refill  . amLODipine (NORVASC) 10 MG tablet Take 0.5 tablets (5 mg total) by mouth daily. 30 tablet 11  . Ascorbic Acid (VITAMIN C) 100 MG tablet Take 100 mg by mouth daily.    Marland Kitchen atenolol (TENORMIN) 100 MG tablet TAKE 1 TABLET BY MOUTH TWICE A DAY 180 tablet 1  . Cholecalciferol (VITAMIN D3) 1000 UNITS tablet Take 1,000 Units by mouth daily.      . Doxepin HCl (SILENOR) 3 MG TABS Take 1 tablet (3 mg total) by mouth at bedtime. 30 tablet 5  . traZODone (DESYREL) 150 MG tablet Take 1 tablet (150 mg total) by mouth at bedtime. 30 tablet 5  . triamcinolone ointment (KENALOG) 0.5 % APPLY TOPICALLY TWICE DAILY 90 g 0  . vitamin B-12 (CYANOCOBALAMIN) 1000 MCG tablet Take 1,000 mcg by mouth daily.      . cloNIDine (CATAPRES) 0.1 MG tablet Take 1 tablet (0.1 mg total) by mouth 2 (two) times daily. For sweating and for the blood pressure. 180 tablet 3  . clopidogrel (PLAVIX) 75 MG tablet Take 75 mg by mouth daily.    . traMADol (ULTRAM) 50 MG tablet Take 50-100 mg by mouth 2 (two) times daily as needed for moderate pain.   1   No facility-administered medications prior to visit.    ROS Review of Systems  Constitutional: Negative for chills, activity change, appetite change, fatigue and unexpected weight change.  HENT: Negative for congestion, mouth sores and sinus pressure.   Eyes: Negative for visual disturbance.  Respiratory: Negative for cough and chest tightness.   Gastrointestinal: Negative for nausea and abdominal pain.  Genitourinary: Negative for frequency, difficulty urinating and vaginal pain.  Musculoskeletal: Positive for arthralgias. Negative for back pain and gait problem.  Skin: Negative for pallor and rash.    Neurological: Negative for dizziness, tremors, weakness, numbness and headaches.  Psychiatric/Behavioral: Positive for sleep disturbance. Negative for confusion.    Objective:  BP 184/88 mmHg  Pulse 71  Wt 144 lb (65.318 kg)  SpO2 95%  BP Readings from Last 3 Encounters:  08/27/15 184/88  05/29/15 170/100  03/24/15 166/72    Wt Readings from Last 3 Encounters:  08/27/15 144 lb (65.318 kg)  05/29/15 135 lb (61.236 kg)  03/24/15 131 lb 13.4 oz (59.8 kg)    Physical Exam  Constitutional: She appears well-developed. No distress.  HENT:  Head: Normocephalic.  Right Ear: External ear normal.  Left Ear: External ear normal.  Nose: Nose normal.  Mouth/Throat: Oropharynx is clear and moist.  Eyes: Conjunctivae are normal. Pupils are equal, round, and reactive to light. Right eye exhibits no discharge. Left eye exhibits no discharge.  Neck: Normal range of motion. Neck supple. No JVD present. No tracheal deviation present. No thyromegaly present.  Cardiovascular: Normal rate, regular rhythm and normal heart sounds.   Pulmonary/Chest: No stridor. No respiratory distress. She has no wheezes.  Abdominal: Soft. Bowel sounds are normal. She exhibits no distension and no mass. There is no tenderness. There is no rebound and no guarding.  Musculoskeletal: She exhibits tenderness. She exhibits no edema.  Lymphadenopathy:    She has no cervical adenopathy.  Neurological: She displays normal reflexes. No cranial  nerve deficit. She exhibits normal muscle tone. Coordination normal.  Skin: No rash noted. No erythema.  Psychiatric: She has a normal mood and affect. Her behavior is normal. Judgment and thought content normal.   R shoulder is tender  Lab Results  Component Value Date   WBC 10.3 05/29/2015   HGB 14.2 05/29/2015   HCT 42.8 05/29/2015   PLT 266.0 05/29/2015   GLUCOSE 164* 05/29/2015   CHOL 159 03/01/2013   TRIG 205.0* 03/01/2013   HDL 44.30 03/01/2013   LDLDIRECT 91.5  03/01/2013   LDLCALC 92 11/06/2008   ALT 54 03/23/2015   AST 39 03/23/2015   NA 141 05/29/2015   K 3.9 05/29/2015   CL 102 05/29/2015   CREATININE 0.82 05/29/2015   BUN 14 05/29/2015   CO2 29 05/29/2015   TSH 3.51 01/30/2015   INR 1.1* 03/01/2013   HGBA1C 6.4 03/01/2013   MICROALBUR 1.7 11/06/2008    Dg Chest 2 View  03/19/2015  CLINICAL DATA:  Fall sometime in the last 2 days. Left shoulder pain. Patient found with swelling of the face. Vomiting. EXAM: CHEST  2 VIEW COMPARISON:  03/09/2012 FINDINGS: Atherosclerotic aortic arch. Heart size within normal limits. Stable prominence of the epicardial adipose tissue. No pneumothorax or pleural effusion. Mild thoracic spondylosis. IMPRESSION: 1. No acute thoracic findings. 2. Atherosclerotic aortic arch. Electronically Signed   By: Van Clines M.D.   On: 03/19/2015 16:30   Dg Shoulder Right  03/19/2015  CLINICAL DATA:  Patient lives at home alone and fell some time over the past 2 days. Patient's family had been trying to call with no answer. When they arrived from out of town the patient was in bed with the right side of her face badly swollen and bruised. Blood was found all over the bathroom. Patient had fallen gotten into bed and was asleep when her family arrived. There was vomit found in the patient's bed. Patient is alert and oriented at this time, but has some loss of memory about the fall. EXAM: RIGHT SHOULDER - 2+ VIEW COMPARISON:  None. FINDINGS: No fracture or dislocation. Mild AC joint osteoarthritis. Glenohumeral joint is relatively well maintained. Bones are demineralized. Soft tissues are unremarkable. IMPRESSION: No fracture or dislocation. Electronically Signed   By: Lajean Manes M.D.   On: 03/19/2015 19:47   Ct Head Wo Contrast  03/19/2015  CLINICAL DATA:  Patient lives at home alone and fell some time over the past 2 days. Patient's family had been trying to call with no answer. When they arrived from out of town the  patient was in bed with the right side of her face badly swollen and bruised. Blood was found all over the bathroom. Patient had fallen gotten into bed and was asleep when her family arrived. There was vomit found in the patient's bed. Patient is alert and oriented at this time, but has some loss of memory about the fall. Pt has hypertension. EXAM: CT HEAD WITHOUT CONTRAST CT MAXILLOFACIAL WITHOUT CONTRAST CT CERVICAL SPINE WITHOUT CONTRAST TECHNIQUE: Multidetector CT imaging of the head, cervical spine, and maxillofacial structures were performed using the standard protocol without intravenous contrast. Multiplanar CT image reconstructions of the cervical spine and maxillofacial structures were also generated. COMPARISON:  MRI and 02/08/2015 FINDINGS: CT HEAD FINDINGS There is central and cortical atrophy. Periventricular white matter changes are consistent with small vessel disease. There is no intra or extra-axial fluid collection or mass lesion. The basilar cisterns and ventricles have a normal appearance.  There is no CT evidence for acute infarction or hemorrhage. Bone windows show soft tissue edema superficial to the right zygomatic arch and right orbit. No calvarial fracture identified. CT MAXILLOFACIAL FINDINGS There is moderate preseptal edema of the right orbit. The right globe is intact. There is significant soft tissue swelling superficial to the right zygomatic arch. A hematoma is seen superficial to the lateral wall of the maxilla which measures 3.4 x 1.6 cm. Soft tissue swelling extends inferiorly to level of the mandible. There is mild anterior subluxation of the temporomandibular joint without frank subluxation or fracture. There is chronic asymmetry of the left globe without evidence for acute injury. The nasal bones, bony nasal septum, zygomatic arches, mandible and pterygoid plates are intact. No evidence for acute fracture of the sinus walls or orbits. CT CERVICAL SPINE FINDINGS There is  significant degenerative disc disease of the cervical spine, with moderate narrowing and posterior osteophyte formation at C3-4, C5-6. There is no acute fracture or subluxation. Note is made of atherosclerotic calcification of the carotid arteries bilaterally. Incidental note is made of a right thyroid nodule, measuring 1.4 cm. IMPRESSION: 1. Atrophy and small vessel disease. 2.  No evidence for acute intracranial abnormality. 3. Significant soft tissue swelling involving the right side of the face extending from the right orbit inferiorly to the right aspect of the mandible. There is a moderate hematoma along the lateral wall of the right maxilla which measures 3.4 cm. Soft tissue swelling involves the preseptal soft tissues of the right orbit. The globe is intact. 4. Mild anterior subluxation of the right temporomandibular joint. No dislocation or fracture. 5. Chronic asymmetry of the left globe without acute injury. 6. Degenerative changes in the cervical spine. No evidence for acute cervical spine abnormality. 7. Incidental note of right thyroid nodule with benign features. No further evaluation is felt to be necessary based on consensus criteria. 8. Carotid calcifications. Electronically Signed   By: Nolon Nations M.D.   On: 03/19/2015 16:36   Ct Cervical Spine Wo Contrast  03/19/2015  CLINICAL DATA:  Patient lives at home alone and fell some time over the past 2 days. Patient's family had been trying to call with no answer. When they arrived from out of town the patient was in bed with the right side of her face badly swollen and bruised. Blood was found all over the bathroom. Patient had fallen gotten into bed and was asleep when her family arrived. There was vomit found in the patient's bed. Patient is alert and oriented at this time, but has some loss of memory about the fall. Pt has hypertension. EXAM: CT HEAD WITHOUT CONTRAST CT MAXILLOFACIAL WITHOUT CONTRAST CT CERVICAL SPINE WITHOUT CONTRAST  TECHNIQUE: Multidetector CT imaging of the head, cervical spine, and maxillofacial structures were performed using the standard protocol without intravenous contrast. Multiplanar CT image reconstructions of the cervical spine and maxillofacial structures were also generated. COMPARISON:  MRI and 02/08/2015 FINDINGS: CT HEAD FINDINGS There is central and cortical atrophy. Periventricular white matter changes are consistent with small vessel disease. There is no intra or extra-axial fluid collection or mass lesion. The basilar cisterns and ventricles have a normal appearance. There is no CT evidence for acute infarction or hemorrhage. Bone windows show soft tissue edema superficial to the right zygomatic arch and right orbit. No calvarial fracture identified. CT MAXILLOFACIAL FINDINGS There is moderate preseptal edema of the right orbit. The right globe is intact. There is significant soft tissue swelling superficial to the  right zygomatic arch. A hematoma is seen superficial to the lateral wall of the maxilla which measures 3.4 x 1.6 cm. Soft tissue swelling extends inferiorly to level of the mandible. There is mild anterior subluxation of the temporomandibular joint without frank subluxation or fracture. There is chronic asymmetry of the left globe without evidence for acute injury. The nasal bones, bony nasal septum, zygomatic arches, mandible and pterygoid plates are intact. No evidence for acute fracture of the sinus walls or orbits. CT CERVICAL SPINE FINDINGS There is significant degenerative disc disease of the cervical spine, with moderate narrowing and posterior osteophyte formation at C3-4, C5-6. There is no acute fracture or subluxation. Note is made of atherosclerotic calcification of the carotid arteries bilaterally. Incidental note is made of a right thyroid nodule, measuring 1.4 cm. IMPRESSION: 1. Atrophy and small vessel disease. 2.  No evidence for acute intracranial abnormality. 3. Significant soft  tissue swelling involving the right side of the face extending from the right orbit inferiorly to the right aspect of the mandible. There is a moderate hematoma along the lateral wall of the right maxilla which measures 3.4 cm. Soft tissue swelling involves the preseptal soft tissues of the right orbit. The globe is intact. 4. Mild anterior subluxation of the right temporomandibular joint. No dislocation or fracture. 5. Chronic asymmetry of the left globe without acute injury. 6. Degenerative changes in the cervical spine. No evidence for acute cervical spine abnormality. 7. Incidental note of right thyroid nodule with benign features. No further evaluation is felt to be necessary based on consensus criteria. 8. Carotid calcifications. Electronically Signed   By: Nolon Nations M.D.   On: 03/19/2015 16:36   US Renal  03/20/2015  CLINICAL DATA:  Acute kidney insufficiency. EXAM: RENAL / URINARY TRACT ULTRASOUND COMPLETE COMPARISON:  None. FINDINGS: Right Kidney: Length: 11.5 cm. Echogenicity within normal limits. No mass or hydronephrosis visualized. Left Kidney: Length: 11.4 cm. Echogenicity within normal limits. In the lower pole left kidney, there is a 1.3 x 1.1 x 1.4 cm cyst with peripheral calcifications. No hydronephrosis visualized. Bladder: Appears normal for degree of bladder distention. IMPRESSION: No acute abnormality. 1.4 cm cyst in lower pole left kidney with peripheral calcifications. Electronically Signed   By: Abelardo Diesel M.D.   On: 03/20/2015 10:34   Dg Shoulder Left  03/19/2015  CLINICAL DATA:  Fall recently with left shoulder pain. Initial encounter. EXAM: LEFT SHOULDER - 2+ VIEW COMPARISON:  None. FINDINGS: No acute fracture or dislocation is identified. There are moderate degenerative changes involving the glenohumeral joint and AC joint. Cystic changes are seen in the lateral humeral head. No bony destruction identified. Soft tissues are unremarkable. IMPRESSION: No acute fracture.  Moderate degenerative changes present involving the glenohumeral joint and AC joint. Electronically Signed   By: Aletta Edouard M.D.   On: 03/19/2015 16:34   Ct Maxillofacial Wo Cm  03/19/2015  CLINICAL DATA:  Patient lives at home alone and fell some time over the past 2 days. Patient's family had been trying to call with no answer. When they arrived from out of town the patient was in bed with the right side of her face badly swollen and bruised. Blood was found all over the bathroom. Patient had fallen gotten into bed and was asleep when her family arrived. There was vomit found in the patient's bed. Patient is alert and oriented at this time, but has some loss of memory about the fall. Pt has hypertension. EXAM: CT HEAD WITHOUT CONTRAST  CT MAXILLOFACIAL WITHOUT CONTRAST CT CERVICAL SPINE WITHOUT CONTRAST TECHNIQUE: Multidetector CT imaging of the head, cervical spine, and maxillofacial structures were performed using the standard protocol without intravenous contrast. Multiplanar CT image reconstructions of the cervical spine and maxillofacial structures were also generated. COMPARISON:  MRI and 02/08/2015 FINDINGS: CT HEAD FINDINGS There is central and cortical atrophy. Periventricular white matter changes are consistent with small vessel disease. There is no intra or extra-axial fluid collection or mass lesion. The basilar cisterns and ventricles have a normal appearance. There is no CT evidence for acute infarction or hemorrhage. Bone windows show soft tissue edema superficial to the right zygomatic arch and right orbit. No calvarial fracture identified. CT MAXILLOFACIAL FINDINGS There is moderate preseptal edema of the right orbit. The right globe is intact. There is significant soft tissue swelling superficial to the right zygomatic arch. A hematoma is seen superficial to the lateral wall of the maxilla which measures 3.4 x 1.6 cm. Soft tissue swelling extends inferiorly to level of the mandible. There  is mild anterior subluxation of the temporomandibular joint without frank subluxation or fracture. There is chronic asymmetry of the left globe without evidence for acute injury. The nasal bones, bony nasal septum, zygomatic arches, mandible and pterygoid plates are intact. No evidence for acute fracture of the sinus walls or orbits. CT CERVICAL SPINE FINDINGS There is significant degenerative disc disease of the cervical spine, with moderate narrowing and posterior osteophyte formation at C3-4, C5-6. There is no acute fracture or subluxation. Note is made of atherosclerotic calcification of the carotid arteries bilaterally. Incidental note is made of a right thyroid nodule, measuring 1.4 cm. IMPRESSION: 1. Atrophy and small vessel disease. 2.  No evidence for acute intracranial abnormality. 3. Significant soft tissue swelling involving the right side of the face extending from the right orbit inferiorly to the right aspect of the mandible. There is a moderate hematoma along the lateral wall of the right maxilla which measures 3.4 cm. Soft tissue swelling involves the preseptal soft tissues of the right orbit. The globe is intact. 4. Mild anterior subluxation of the right temporomandibular joint. No dislocation or fracture. 5. Chronic asymmetry of the left globe without acute injury. 6. Degenerative changes in the cervical spine. No evidence for acute cervical spine abnormality. 7. Incidental note of right thyroid nodule with benign features. No further evaluation is felt to be necessary based on consensus criteria. 8. Carotid calcifications. Electronically Signed   By: Nolon Nations M.D.   On: 03/19/2015 16:36    Assessment & Plan:   Diagnoses and all orders for this visit:  Pain in joint of right shoulder -     ketorolac (TORADOL) 30 MG/ML injection 30 mg; Inject 1 mL (30 mg total) into the muscle once.  Other orders -     diclofenac (VOLTAREN) 50 MG EC tablet; Take 1 tablet (50 mg total) by mouth 2  (two) times daily as needed for moderate pain. -     traMADol (ULTRAM) 50 MG tablet; Take 1-2 tablets (50-100 mg total) by mouth 2 (two) times daily as needed for moderate pain. -     Discontinue: cloNIDine (CATAPRES) 0.1 MG tablet; Take 1 tablet (0.1 mg total) by mouth 2 (two) times daily. For sweating and for the blood pressure. -     Discontinue: clopidogrel (PLAVIX) 75 MG tablet; Take 1 tablet (75 mg total) by mouth daily. -     clopidogrel (PLAVIX) 75 MG tablet; Take 1 tablet (75 mg total)  by mouth daily. -     cloNIDine (CATAPRES) 0.1 MG tablet; Take 1 tablet (0.1 mg total) by mouth 2 (two) times daily. For sweating and for the blood pressure.  I have changed Ms. Bentivegna's traMADol. I am also having her start on diclofenac. Additionally, I am having her maintain her vitamin B-12, cholecalciferol, vitamin C, amLODipine, triamcinolone ointment, traZODone, Doxepin HCl, atenolol, clopidogrel, and cloNIDine. We administered ketorolac.  Meds ordered this encounter  Medications  . diclofenac (VOLTAREN) 50 MG EC tablet    Sig: Take 1 tablet (50 mg total) by mouth 2 (two) times daily as needed for moderate pain.    Dispense:  60 tablet    Refill:  1  . traMADol (ULTRAM) 50 MG tablet    Sig: Take 1-2 tablets (50-100 mg total) by mouth 2 (two) times daily as needed for moderate pain.    Dispense:  60 tablet    Refill:  0  . DISCONTD: cloNIDine (CATAPRES) 0.1 MG tablet    Sig: Take 1 tablet (0.1 mg total) by mouth 2 (two) times daily. For sweating and for the blood pressure.    Dispense:  180 tablet    Refill:  3  . DISCONTD: clopidogrel (PLAVIX) 75 MG tablet    Sig: Take 1 tablet (75 mg total) by mouth daily.    Dispense:  90 tablet    Refill:  3  . clopidogrel (PLAVIX) 75 MG tablet    Sig: Take 1 tablet (75 mg total) by mouth daily.    Dispense:  90 tablet    Refill:  3  . cloNIDine (CATAPRES) 0.1 MG tablet    Sig: Take 1 tablet (0.1 mg total) by mouth 2 (two) times daily. For sweating and  for the blood pressure.    Dispense:  180 tablet    Refill:  3  . ketorolac (TORADOL) 30 MG/ML injection 30 mg    Sig:      Follow-up: No Follow-up on file.  Walker Kehr, MD

## 2015-08-27 NOTE — Assessment & Plan Note (Signed)
Norvasc, Atenolol, Catapress BP is ok at home - labile SBP= 110-190. SBP=140 at home

## 2015-08-27 NOTE — Assessment & Plan Note (Signed)
Catapress, Trazodone, Doxepine  Potential benefits of a long term the above meds  use as well as potential risks  and complications were explained to the patient and were aknowledged.

## 2015-08-30 ENCOUNTER — Telehealth: Payer: Self-pay | Admitting: Internal Medicine

## 2015-08-30 DIAGNOSIS — M25519 Pain in unspecified shoulder: Secondary | ICD-10-CM

## 2015-08-30 NOTE — Telephone Encounter (Signed)
Ok to use Tramadol and Diclofenac together. I don't understand the shot part of the question. We can ref to Ortho Thx

## 2015-08-30 NOTE — Telephone Encounter (Signed)
Pt was in on Monday for for shoulder pain. She was given two pain medications and an injection. Pt's daughter [name] states she is in more pain than she was when she came in.  She has been only taking the tramadol but not the diclofenac. Should she be taking both? She's also wondering if a nerve might of been hit with the injection.   Can you please call her daughter [name] at [phone]

## 2015-08-31 ENCOUNTER — Ambulatory Visit: Payer: Medicare Other | Admitting: Family

## 2015-08-31 NOTE — Telephone Encounter (Signed)
LVM for pt to call back as soon as possible.   

## 2015-09-03 ENCOUNTER — Telehealth: Payer: Self-pay | Admitting: Internal Medicine

## 2015-09-03 NOTE — Telephone Encounter (Signed)
She has not tried anything over the counter such as aspercream or other otc topical arthritis medications. She will try something over the counter.  She has not been taking the diclofenac.  She is taking the tramadol.  She may try diclofenac and see if that helps.  She will follow up with Dr Alain Marion.

## 2015-09-03 NOTE — Telephone Encounter (Signed)
patrient called to follow up. Advised still pending.

## 2015-09-03 NOTE — Telephone Encounter (Signed)
Pt called in and said that the injection she has last week did not help her shoulder.  She wants to know if there is something Plot can call in for her to rub on it to help?    [facility] on [address]

## 2015-09-03 NOTE — Telephone Encounter (Signed)
Best number to call is [phone]  Sandra [name]

## 2015-09-05 NOTE — Telephone Encounter (Signed)
Pt's daughter informed of below. Pt's daughter would like ortho referral.

## 2015-09-05 NOTE — Telephone Encounter (Signed)
Ok Thx 

## 2015-11-13 ENCOUNTER — Ambulatory Visit (INDEPENDENT_AMBULATORY_CARE_PROVIDER_SITE_OTHER)
Admission: RE | Admit: 2015-11-13 | Discharge: 2015-11-13 | Disposition: A | Discharge status: Home / Self Care | Payer: Medicare Other | Source: Ambulatory Visit | Attending: Internal Medicine | Admitting: Internal Medicine

## 2015-11-13 ENCOUNTER — Ambulatory Visit (INDEPENDENT_AMBULATORY_CARE_PROVIDER_SITE_OTHER): Payer: Medicare Other | Admitting: Internal Medicine

## 2015-11-13 ENCOUNTER — Encounter: Payer: Self-pay | Admitting: Internal Medicine

## 2015-11-13 VITALS — BP 130/80 | HR 117 | Temp 98.5°F | Wt 144.0 lb

## 2015-11-13 DIAGNOSIS — R5383 Other fatigue: Secondary | ICD-10-CM

## 2015-11-13 DIAGNOSIS — J069 Acute upper respiratory infection, unspecified: Secondary | ICD-10-CM

## 2015-11-13 DIAGNOSIS — I10 Essential (primary) hypertension: Secondary | ICD-10-CM

## 2015-11-13 MED ORDER — HYDROCOD POLST-CPM POLST ER 10-8 MG/5ML PO SUER: 5.0000 mL | Freq: Two times a day (BID) | ORAL | Status: DC | PRN

## 2015-11-13 MED ORDER — LEVOFLOXACIN 500 MG PO TABS: 500.0000 mg | ORAL_TABLET | Freq: Every day | ORAL | Status: DC

## 2015-11-13 NOTE — Progress Notes (Signed)
Pre visit review using our clinic review tool, if applicable. No additional management support is needed unless otherwise documented below in the visit note. 

## 2015-11-13 NOTE — Assessment & Plan Note (Signed)
Due to URI Rest CXR to r/o PNA

## 2015-11-13 NOTE — Patient Instructions (Signed)
Use over-the-counter  "cold" medicines  such as"Afrin" nasal spray for nasal congestion as directed instead. Use " Delsym" or" Robitussin" cough syrup varietis for cough.  You can use plain "Tylenol" or "Advil" for fever, chills and achyness. Use Halls or Ricola cough drops.  Please, make an appointment if you are not better or if you're worse.  

## 2015-11-13 NOTE — Progress Notes (Addendum)
Subjective:  Patient ID: Sandra [name], female    DOB: 11/11/1928  Age: 82 y.o. MRN: [id number]  CC: No chief complaint on file.   URI  This is a new problem. The current episode started in the past 7 days. The problem has been unchanged. The maximum temperature recorded prior to her arrival was 100.4 - 100.9 F. Associated symptoms include chest pain and coughing. Pertinent negatives include no abdominal pain, congestion, headaches, nausea, rash or wheezing. She has tried acetaminophen for the symptoms. The treatment provided no relief.   Sandra [name] presents for URI sx's x 1 week  Outpatient Prescriptions Prior to Visit  Medication Sig Dispense Refill  . amLODipine (NORVASC) 10 MG tablet Take 0.5 tablets (5 mg total) by mouth daily. 30 tablet 11  . Ascorbic Acid (VITAMIN C) 100 MG tablet Take 100 mg by mouth daily.    Marland Kitchen atenolol (TENORMIN) 100 MG tablet TAKE 1 TABLET BY MOUTH TWICE A DAY 180 tablet 1  . Cholecalciferol (VITAMIN D3) 1000 UNITS tablet Take 1,000 Units by mouth daily.      . cloNIDine (CATAPRES) 0.1 MG tablet Take 1 tablet (0.1 mg total) by mouth 2 (two) times daily. For sweating and for the blood pressure. 180 tablet 3  . clopidogrel (PLAVIX) 75 MG tablet Take 1 tablet (75 mg total) by mouth daily. 90 tablet 3  . diclofenac (VOLTAREN) 50 MG EC tablet Take 1 tablet (50 mg total) by mouth 2 (two) times daily as needed for moderate pain. 60 tablet 1  . Doxepin HCl (SILENOR) 3 MG TABS Take 1 tablet (3 mg total) by mouth at bedtime. 30 tablet 5  . traMADol (ULTRAM) 50 MG tablet Take 1-2 tablets (50-100 mg total) by mouth 2 (two) times daily as needed for moderate pain. 60 tablet 0  . traZODone (DESYREL) 150 MG tablet Take 1 tablet (150 mg total) by mouth at bedtime. 30 tablet 5  . triamcinolone ointment (KENALOG) 0.5 % APPLY TOPICALLY TWICE DAILY 90 g 0  . vitamin B-12 (CYANOCOBALAMIN) 1000 MCG tablet Take 1,000 mcg by mouth daily.       No facility-administered medications  prior to visit.    ROS Review of Systems  Constitutional: Positive for chills and fatigue. Negative for activity change, appetite change and unexpected weight change.  HENT: Negative for congestion, mouth sores and sinus pressure.   Eyes: Negative for visual disturbance.  Respiratory: Positive for cough and chest tightness. Negative for shortness of breath and wheezing.   Cardiovascular: Positive for chest pain.  Gastrointestinal: Negative for nausea and abdominal pain.  Genitourinary: Negative for frequency, difficulty urinating and vaginal pain.  Musculoskeletal: Negative for back pain and gait problem.  Skin: Negative for pallor and rash.  Neurological: Negative for dizziness, tremors, weakness, numbness and headaches.  Psychiatric/Behavioral: Negative for confusion and sleep disturbance.    Objective:  BP 130/80 mmHg  Pulse 117  Temp(Src) 98.5 F (36.9 C) (Oral)  Wt 144 lb (65.318 kg)  SpO2 95%  BP Readings from Last 3 Encounters:  11/13/15 130/80  08/27/15 184/88  05/29/15 170/100    Wt Readings from Last 3 Encounters:  11/13/15 144 lb (65.318 kg)  08/27/15 144 lb (65.318 kg)  05/29/15 135 lb (61.236 kg)    Physical Exam  Lab Results  Component Value Date   WBC 10.3 05/29/2015   HGB 14.2 05/29/2015   HCT 42.8 05/29/2015   PLT 266.0 05/29/2015   GLUCOSE 164* 05/29/2015   CHOL 159  03/01/2013   TRIG 205.0* 03/01/2013   HDL 44.30 03/01/2013   LDLDIRECT 91.5 03/01/2013   LDLCALC 92 11/06/2008   ALT 54 03/23/2015   AST 39 03/23/2015   NA 141 05/29/2015   K 3.9 05/29/2015   CL 102 05/29/2015   CREATININE 0.82 05/29/2015   BUN 14 05/29/2015   CO2 29 05/29/2015   TSH 3.51 01/30/2015   INR 1.1* 03/01/2013   HGBA1C 6.4 03/01/2013   MICROALBUR 1.7 11/06/2008    Dg Chest 2 View  03/19/2015  CLINICAL DATA:  Fall sometime in the last 2 days. Left shoulder pain. Patient found with swelling of the face. Vomiting. EXAM: CHEST  2 VIEW COMPARISON:  03/09/2012  FINDINGS: Atherosclerotic aortic arch. Heart size within normal limits. Stable prominence of the epicardial adipose tissue. No pneumothorax or pleural effusion. Mild thoracic spondylosis. IMPRESSION: 1. No acute thoracic findings. 2. Atherosclerotic aortic arch. Electronically Signed   By: Van Clines M.D.   On: 03/19/2015 16:30   Dg Shoulder Right  03/19/2015  CLINICAL DATA:  Patient lives at home alone and fell some time over the past 2 days. Patient's family had been trying to call with no answer. When they arrived from out of town the patient was in bed with the right side of her face badly swollen and bruised. Blood was found all over the bathroom. Patient had fallen gotten into bed and was asleep when her family arrived. There was vomit found in the patient's bed. Patient is alert and oriented at this time, but has some loss of memory about the fall. EXAM: RIGHT SHOULDER - 2+ VIEW COMPARISON:  None. FINDINGS: No fracture or dislocation. Mild AC joint osteoarthritis. Glenohumeral joint is relatively well maintained. Bones are demineralized. Soft tissues are unremarkable. IMPRESSION: No fracture or dislocation. Electronically Signed   By: Lajean Manes M.D.   On: 03/19/2015 19:47   Ct Head Wo Contrast  03/19/2015  CLINICAL DATA:  Patient lives at home alone and fell some time over the past 2 days. Patient's family had been trying to call with no answer. When they arrived from out of town the patient was in bed with the right side of her face badly swollen and bruised. Blood was found all over the bathroom. Patient had fallen gotten into bed and was asleep when her family arrived. There was vomit found in the patient's bed. Patient is alert and oriented at this time, but has some loss of memory about the fall. Pt has hypertension. EXAM: CT HEAD WITHOUT CONTRAST CT MAXILLOFACIAL WITHOUT CONTRAST CT CERVICAL SPINE WITHOUT CONTRAST TECHNIQUE: Multidetector CT imaging of the head, cervical spine, and  maxillofacial structures were performed using the standard protocol without intravenous contrast. Multiplanar CT image reconstructions of the cervical spine and maxillofacial structures were also generated. COMPARISON:  MRI and 02/08/2015 FINDINGS: CT HEAD FINDINGS There is central and cortical atrophy. Periventricular white matter changes are consistent with small vessel disease. There is no intra or extra-axial fluid collection or mass lesion. The basilar cisterns and ventricles have a normal appearance. There is no CT evidence for acute infarction or hemorrhage. Bone windows show soft tissue edema superficial to the right zygomatic arch and right orbit. No calvarial fracture identified. CT MAXILLOFACIAL FINDINGS There is moderate preseptal edema of the right orbit. The right globe is intact. There is significant soft tissue swelling superficial to the right zygomatic arch. A hematoma is seen superficial to the lateral wall of the maxilla which measures 3.4 x 1.6 cm.  Soft tissue swelling extends inferiorly to level of the mandible. There is mild anterior subluxation of the temporomandibular joint without frank subluxation or fracture. There is chronic asymmetry of the left globe without evidence for acute injury. The nasal bones, bony nasal septum, zygomatic arches, mandible and pterygoid plates are intact. No evidence for acute fracture of the sinus walls or orbits. CT CERVICAL SPINE FINDINGS There is significant degenerative disc disease of the cervical spine, with moderate narrowing and posterior osteophyte formation at C3-4, C5-6. There is no acute fracture or subluxation. Note is made of atherosclerotic calcification of the carotid arteries bilaterally. Incidental note is made of a right thyroid nodule, measuring 1.4 cm. IMPRESSION: 1. Atrophy and small vessel disease. 2.  No evidence for acute intracranial abnormality. 3. Significant soft tissue swelling involving the right side of the face extending from  the right orbit inferiorly to the right aspect of the mandible. There is a moderate hematoma along the lateral wall of the right maxilla which measures 3.4 cm. Soft tissue swelling involves the preseptal soft tissues of the right orbit. The globe is intact. 4. Mild anterior subluxation of the right temporomandibular joint. No dislocation or fracture. 5. Chronic asymmetry of the left globe without acute injury. 6. Degenerative changes in the cervical spine. No evidence for acute cervical spine abnormality. 7. Incidental note of right thyroid nodule with benign features. No further evaluation is felt to be necessary based on consensus criteria. 8. Carotid calcifications. Electronically Signed   By: Nolon Nations M.D.   On: 03/19/2015 16:36   Ct Cervical Spine Wo Contrast  03/19/2015  CLINICAL DATA:  Patient lives at home alone and fell some time over the past 2 days. Patient's family had been trying to call with no answer. When they arrived from out of town the patient was in bed with the right side of her face badly swollen and bruised. Blood was found all over the bathroom. Patient had fallen gotten into bed and was asleep when her family arrived. There was vomit found in the patient's bed. Patient is alert and oriented at this time, but has some loss of memory about the fall. Pt has hypertension. EXAM: CT HEAD WITHOUT CONTRAST CT MAXILLOFACIAL WITHOUT CONTRAST CT CERVICAL SPINE WITHOUT CONTRAST TECHNIQUE: Multidetector CT imaging of the head, cervical spine, and maxillofacial structures were performed using the standard protocol without intravenous contrast. Multiplanar CT image reconstructions of the cervical spine and maxillofacial structures were also generated. COMPARISON:  MRI and 02/08/2015 FINDINGS: CT HEAD FINDINGS There is central and cortical atrophy. Periventricular white matter changes are consistent with small vessel disease. There is no intra or extra-axial fluid collection or mass lesion. The  basilar cisterns and ventricles have a normal appearance. There is no CT evidence for acute infarction or hemorrhage. Bone windows show soft tissue edema superficial to the right zygomatic arch and right orbit. No calvarial fracture identified. CT MAXILLOFACIAL FINDINGS There is moderate preseptal edema of the right orbit. The right globe is intact. There is significant soft tissue swelling superficial to the right zygomatic arch. A hematoma is seen superficial to the lateral wall of the maxilla which measures 3.4 x 1.6 cm. Soft tissue swelling extends inferiorly to level of the mandible. There is mild anterior subluxation of the temporomandibular joint without frank subluxation or fracture. There is chronic asymmetry of the left globe without evidence for acute injury. The nasal bones, bony nasal septum, zygomatic arches, mandible and pterygoid plates are intact. No evidence for acute  fracture of the sinus walls or orbits. CT CERVICAL SPINE FINDINGS There is significant degenerative disc disease of the cervical spine, with moderate narrowing and posterior osteophyte formation at C3-4, C5-6. There is no acute fracture or subluxation. Note is made of atherosclerotic calcification of the carotid arteries bilaterally. Incidental note is made of a right thyroid nodule, measuring 1.4 cm. IMPRESSION: 1. Atrophy and small vessel disease. 2.  No evidence for acute intracranial abnormality. 3. Significant soft tissue swelling involving the right side of the face extending from the right orbit inferiorly to the right aspect of the mandible. There is a moderate hematoma along the lateral wall of the right maxilla which measures 3.4 cm. Soft tissue swelling involves the preseptal soft tissues of the right orbit. The globe is intact. 4. Mild anterior subluxation of the right temporomandibular joint. No dislocation or fracture. 5. Chronic asymmetry of the left globe without acute injury. 6. Degenerative changes in the cervical  spine. No evidence for acute cervical spine abnormality. 7. Incidental note of right thyroid nodule with benign features. No further evaluation is felt to be necessary based on consensus criteria. 8. Carotid calcifications. Electronically Signed   By: Nolon Nations M.D.   On: 03/19/2015 16:36   US Renal  03/20/2015  CLINICAL DATA:  Acute kidney insufficiency. EXAM: RENAL / URINARY TRACT ULTRASOUND COMPLETE COMPARISON:  None. FINDINGS: Right Kidney: Length: 11.5 cm. Echogenicity within normal limits. No mass or hydronephrosis visualized. Left Kidney: Length: 11.4 cm. Echogenicity within normal limits. In the lower pole left kidney, there is a 1.3 x 1.1 x 1.4 cm cyst with peripheral calcifications. No hydronephrosis visualized. Bladder: Appears normal for degree of bladder distention. IMPRESSION: No acute abnormality. 1.4 cm cyst in lower pole left kidney with peripheral calcifications. Electronically Signed   By: Abelardo Diesel M.D.   On: 03/20/2015 10:34   Dg Shoulder Left  03/19/2015  CLINICAL DATA:  Fall recently with left shoulder pain. Initial encounter. EXAM: LEFT SHOULDER - 2+ VIEW COMPARISON:  None. FINDINGS: No acute fracture or dislocation is identified. There are moderate degenerative changes involving the glenohumeral joint and AC joint. Cystic changes are seen in the lateral humeral head. No bony destruction identified. Soft tissues are unremarkable. IMPRESSION: No acute fracture. Moderate degenerative changes present involving the glenohumeral joint and AC joint. Electronically Signed   By: Aletta Edouard M.D.   On: 03/19/2015 16:34   Ct Maxillofacial Wo Cm  03/19/2015  CLINICAL DATA:  Patient lives at home alone and fell some time over the past 2 days. Patient's family had been trying to call with no answer. When they arrived from out of town the patient was in bed with the right side of her face badly swollen and bruised. Blood was found all over the bathroom. Patient had fallen gotten  into bed and was asleep when her family arrived. There was vomit found in the patient's bed. Patient is alert and oriented at this time, but has some loss of memory about the fall. Pt has hypertension. EXAM: CT HEAD WITHOUT CONTRAST CT MAXILLOFACIAL WITHOUT CONTRAST CT CERVICAL SPINE WITHOUT CONTRAST TECHNIQUE: Multidetector CT imaging of the head, cervical spine, and maxillofacial structures were performed using the standard protocol without intravenous contrast. Multiplanar CT image reconstructions of the cervical spine and maxillofacial structures were also generated. COMPARISON:  MRI and 02/08/2015 FINDINGS: CT HEAD FINDINGS There is central and cortical atrophy. Periventricular white matter changes are consistent with small vessel disease. There is no intra or extra-axial fluid  collection or mass lesion. The basilar cisterns and ventricles have a normal appearance. There is no CT evidence for acute infarction or hemorrhage. Bone windows show soft tissue edema superficial to the right zygomatic arch and right orbit. No calvarial fracture identified. CT MAXILLOFACIAL FINDINGS There is moderate preseptal edema of the right orbit. The right globe is intact. There is significant soft tissue swelling superficial to the right zygomatic arch. A hematoma is seen superficial to the lateral wall of the maxilla which measures 3.4 x 1.6 cm. Soft tissue swelling extends inferiorly to level of the mandible. There is mild anterior subluxation of the temporomandibular joint without frank subluxation or fracture. There is chronic asymmetry of the left globe without evidence for acute injury. The nasal bones, bony nasal septum, zygomatic arches, mandible and pterygoid plates are intact. No evidence for acute fracture of the sinus walls or orbits. CT CERVICAL SPINE FINDINGS There is significant degenerative disc disease of the cervical spine, with moderate narrowing and posterior osteophyte formation at C3-4, C5-6. There is no  acute fracture or subluxation. Note is made of atherosclerotic calcification of the carotid arteries bilaterally. Incidental note is made of a right thyroid nodule, measuring 1.4 cm. IMPRESSION: 1. Atrophy and small vessel disease. 2.  No evidence for acute intracranial abnormality. 3. Significant soft tissue swelling involving the right side of the face extending from the right orbit inferiorly to the right aspect of the mandible. There is a moderate hematoma along the lateral wall of the right maxilla which measures 3.4 cm. Soft tissue swelling involves the preseptal soft tissues of the right orbit. The globe is intact. 4. Mild anterior subluxation of the right temporomandibular joint. No dislocation or fracture. 5. Chronic asymmetry of the left globe without acute injury. 6. Degenerative changes in the cervical spine. No evidence for acute cervical spine abnormality. 7. Incidental note of right thyroid nodule with benign features. No further evaluation is felt to be necessary based on consensus criteria. 8. Carotid calcifications. Electronically Signed   By: Nolon Nations M.D.   On: 03/19/2015 16:36    Assessment & Plan:   There are no diagnoses linked to this encounter. I am having Ms. Lartigue maintain her vitamin B-12, cholecalciferol, vitamin C, amLODipine, triamcinolone ointment, traZODone, Doxepin HCl, atenolol, diclofenac, traMADol, clopidogrel, and cloNIDine.  No orders of the defined types were placed in this encounter.     Follow-up: No Follow-up on file.  Walker Kehr, MD

## 2015-11-13 NOTE — Assessment & Plan Note (Signed)
Norvasc, Atenolol, Catapress BP is good

## 2015-11-19 ENCOUNTER — Telehealth: Payer: Self-pay

## 2015-11-19 NOTE — Telephone Encounter (Addendum)
Pt dtr [name]) called. OV last Tuesday. Meds did help but is still having sx. Rq ref for adx and cough med.   [name] cell: [phone]

## 2015-11-19 NOTE — Telephone Encounter (Signed)
Ok to ref Tussionex and Levaquin Thx

## 2015-11-20 MED ORDER — LEVOFLOXACIN 500 MG PO TABS: 500.0000 mg | ORAL_TABLET | Freq: Every day | ORAL | Status: DC

## 2015-11-20 MED ORDER — HYDROCOD POLST-CPM POLST ER 10-8 MG/5ML PO SUER: 5.0000 mL | Freq: Two times a day (BID) | ORAL | Status: DC | PRN

## 2015-11-20 NOTE — Telephone Encounter (Signed)
Patient has called back in regards.  Please call patient back in regards to if she will have to pick script for tussionex up.  Please call patient back at [phone].

## 2015-11-20 NOTE — Telephone Encounter (Signed)
rx printed and erx.   Pt informed of same.

## 2015-11-20 NOTE — Telephone Encounter (Signed)
[name] informed Rx is upfront for p/u.

## 2015-11-20 NOTE — Telephone Encounter (Signed)
Sandra [name] called to follow up. She states that she was not sure if the tussinex was faxed. Saw nothing up front to indicate that it was not. Please give her a call to advise either way on tussinex

## 2015-12-13 ENCOUNTER — Other Ambulatory Visit: Payer: Self-pay | Admitting: Internal Medicine

## 2015-12-24 ENCOUNTER — Telehealth: Payer: Self-pay

## 2015-12-24 NOTE — Telephone Encounter (Signed)
Call to schedule AWV; spoke with [name] who agreed to discuss with her mother and make apt. To call the office at [facility] to schedule

## 2016-01-14 ENCOUNTER — Other Ambulatory Visit: Payer: Self-pay | Admitting: Internal Medicine

## 2016-02-15 ENCOUNTER — Ambulatory Visit (INDEPENDENT_AMBULATORY_CARE_PROVIDER_SITE_OTHER)
Admission: RE | Admit: 2016-02-15 | Discharge: 2016-02-15 | Disposition: A | Discharge status: Home / Self Care | Payer: Medicare Other | Source: Ambulatory Visit | Attending: Internal Medicine | Admitting: Internal Medicine

## 2016-02-15 ENCOUNTER — Ambulatory Visit (INDEPENDENT_AMBULATORY_CARE_PROVIDER_SITE_OTHER): Payer: Medicare Other | Admitting: Internal Medicine

## 2016-02-15 ENCOUNTER — Other Ambulatory Visit (INDEPENDENT_AMBULATORY_CARE_PROVIDER_SITE_OTHER): Payer: Medicare Other

## 2016-02-15 ENCOUNTER — Encounter: Payer: Self-pay | Admitting: Internal Medicine

## 2016-02-15 VITALS — BP 170/80 | HR 72 | Wt 140.0 lb

## 2016-02-15 DIAGNOSIS — E538 Deficiency of other specified B group vitamins: Secondary | ICD-10-CM | POA: Diagnosis not present

## 2016-02-15 DIAGNOSIS — Z13820 Encounter for screening for osteoporosis: Secondary | ICD-10-CM

## 2016-02-15 DIAGNOSIS — I10 Essential (primary) hypertension: Secondary | ICD-10-CM

## 2016-02-15 DIAGNOSIS — R7309 Other abnormal glucose: Secondary | ICD-10-CM

## 2016-02-15 DIAGNOSIS — M810 Age-related osteoporosis without current pathological fracture: Secondary | ICD-10-CM

## 2016-02-15 DIAGNOSIS — G4700 Insomnia, unspecified: Secondary | ICD-10-CM | POA: Diagnosis not present

## 2016-02-15 DIAGNOSIS — D6489 Other specified anemias: Secondary | ICD-10-CM

## 2016-02-15 DIAGNOSIS — Z23 Encounter for immunization: Secondary | ICD-10-CM

## 2016-02-15 DIAGNOSIS — D649 Anemia, unspecified: Secondary | ICD-10-CM | POA: Insufficient documentation

## 2016-02-15 LAB — BASIC METABOLIC PANEL
BUN: 16 mg/dL (ref 6–23)
CHLORIDE: 103 meq/L (ref 96–112)
CO2: 29 mEq/L (ref 19–32)
CREATININE: 0.77 mg/dL (ref 0.40–1.20)
Calcium: 9.5 mg/dL (ref 8.4–10.5)
GFR: 75.78 mL/min (ref >60.00)
GLUCOSE: 124 mg/dL — AB (ref 70–99)
POTASSIUM: 3.9 meq/L (ref 3.5–5.1)
Sodium: 140 mEq/L (ref 135–145)

## 2016-02-15 LAB — CBC WITH DIFFERENTIAL/PLATELET
BASOS PCT: 0.4 % (ref 0.0–3.0)
Basophils Absolute: 0 10*3/uL (ref 0.0–0.1)
Eosinophils Absolute: 0 10*3/uL (ref 0.0–0.7)
Eosinophils Relative: 0.4 % (ref 0.0–5.0)
HEMATOCRIT: 41.1 % (ref 36.0–46.0)
HEMOGLOBIN: 14 g/dL (ref 12.0–15.0)
LYMPHS PCT: 22.3 % (ref 12.0–46.0)
Lymphs Abs: 1.9 10*3/uL (ref 0.7–4.0)
MCHC: 34.1 g/dL (ref 30.0–36.0)
MCV: 94.1 fl (ref 78.0–100.0)
MONOS PCT: 8.4 % (ref 3.0–12.0)
Monocytes Absolute: 0.7 10*3/uL (ref 0.1–1.0)
NEUTROS ABS: 5.7 10*3/uL (ref 1.4–7.7)
Neutrophils Relative %: 68.5 % (ref 43.0–77.0)
PLATELETS: 258 10*3/uL (ref 150.0–400.0)
RBC: 4.37 Mil/uL (ref 3.87–5.11)
RDW: 13.3 % (ref 11.5–15.5)
WBC: 8.4 10*3/uL (ref 4.0–10.5)

## 2016-02-15 LAB — HEPATIC FUNCTION PANEL
ALT: 8 U/L (ref 0–35)
AST: 13 U/L (ref 0–37)
Albumin: 4.4 g/dL (ref 3.5–5.2)
Alkaline Phosphatase: 51 U/L (ref 39–117)
BILIRUBIN DIRECT: 0.1 mg/dL (ref 0.0–0.3)
BILIRUBIN TOTAL: 0.5 mg/dL (ref 0.2–1.2)
Total Protein: 6.8 g/dL (ref 6.0–8.3)

## 2016-02-15 LAB — HEMOGLOBIN A1C: HEMOGLOBIN A1C: 6.6 % — AB (ref 4.6–6.5)

## 2016-02-15 MED ORDER — DICLOFENAC SODIUM 1 % TD GEL: 1.0000 "application " | Freq: Four times a day (QID) | TRANSDERMAL | 5 refills | Status: AC

## 2016-02-15 NOTE — Assessment & Plan Note (Signed)
Silenor

## 2016-02-15 NOTE — Addendum Note (Signed)
Addended by: Cassandria Anger on: 02/15/2016 11:23 AM   Modules accepted: Orders

## 2016-02-15 NOTE — Addendum Note (Signed)
Addended by: Cresenciano Lick on: 02/15/2016 11:35 AM   Modules accepted: Orders

## 2016-02-15 NOTE — Progress Notes (Signed)
Subjective:  Patient ID: Sandra [name], female    DOB: 1928-08-31  Age: 82 y.o. MRN: [id number]  CC: No chief complaint on file.   HPI SAMAURA CLOWDUS presents for HTN, OA, HAs f/u  Outpatient Medications Prior to Visit  Medication Sig Dispense Refill  . amLODipine (NORVASC) 10 MG tablet Take 0.5 tablets (5 mg total) by mouth daily. 30 tablet 11  . Ascorbic Acid (VITAMIN C) 100 MG tablet Take 100 mg by mouth daily.    Marland Kitchen atenolol (TENORMIN) 100 MG tablet TAKE ONE TABLET BY MOUTH TWICE DAILY 180 tablet 1  . Cholecalciferol (VITAMIN D3) 1000 UNITS tablet Take 1,000 Units by mouth daily.      . cloNIDine (CATAPRES) 0.1 MG tablet Take 1 tablet (0.1 mg total) by mouth 2 (two) times daily. For sweating and for the blood pressure. 180 tablet 3  . clopidogrel (PLAVIX) 75 MG tablet Take 1 tablet (75 mg total) by mouth daily. 90 tablet 3  . diclofenac (VOLTAREN) 50 MG EC tablet Take 1 tablet (50 mg total) by mouth 2 (two) times daily as needed for moderate pain. 60 tablet 1  . SILENOR 3 MG TABS TAKE ONE TABLET BY MOUTH AT BEDTIME 30 tablet 5  . traMADol (ULTRAM) 50 MG tablet Take 1-2 tablets (50-100 mg total) by mouth 2 (two) times daily as needed for moderate pain. 60 tablet 0  . traZODone (DESYREL) 150 MG tablet Take 1 tablet (150 mg total) by mouth at bedtime. 30 tablet 5  . triamcinolone ointment (KENALOG) 0.5 % APPLY TOPICALLY TWICE DAILY 90 g 0  . vitamin B-12 (CYANOCOBALAMIN) 1000 MCG tablet Take 1,000 mcg by mouth daily.      . chlorpheniramine-HYDROcodone (TUSSIONEX PENNKINETIC ER) 10-8 MG/5ML SUER Take 5 mLs by mouth every 12 (twelve) hours as needed for cough. (Patient not taking: Reported on 02/15/2016) 100 mL 0  . levofloxacin (LEVAQUIN) 500 MG tablet Take 1 tablet (500 mg total) by mouth daily. (Patient not taking: Reported on 02/15/2016) 7 tablet 0   No facility-administered medications prior to visit.     ROS Review of Systems  Constitutional: Negative for activity change, appetite  change, chills, fatigue and unexpected weight change.  HENT: Negative for congestion, mouth sores and sinus pressure.   Eyes: Negative for visual disturbance.  Respiratory: Negative for cough and chest tightness.   Gastrointestinal: Negative for abdominal pain and nausea.  Genitourinary: Negative for difficulty urinating, frequency and vaginal pain.  Musculoskeletal: Negative for back pain and gait problem.  Skin: Negative for pallor and rash.  Neurological: Negative for dizziness, tremors, weakness, numbness and headaches.  Psychiatric/Behavioral: Positive for sleep disturbance. Negative for confusion.    Objective:  BP (!) 170/80   Pulse 72   Wt 140 lb (63.5 kg)   SpO2 93%   BMI 24.80 kg/m   BP Readings from Last 3 Encounters:  02/15/16 (!) 170/80  11/13/15 130/80  08/27/15 (!) 184/88    Wt Readings from Last 3 Encounters:  02/15/16 140 lb (63.5 kg)  11/13/15 144 lb (65.3 kg)  08/27/15 144 lb (65.3 kg)    Physical Exam  Constitutional: She appears well-developed. No distress.  HENT:  Head: Normocephalic.  Right Ear: External ear normal.  Left Ear: External ear normal.  Nose: Nose normal.  Mouth/Throat: Oropharynx is clear and moist.  Eyes: Conjunctivae are normal. Pupils are equal, round, and reactive to light. Right eye exhibits no discharge. Left eye exhibits no discharge.  Neck: Normal range of  motion. Neck supple. No JVD present. No tracheal deviation present. No thyromegaly present.  Cardiovascular: Normal rate, regular rhythm and normal heart sounds.   Pulmonary/Chest: No stridor. No respiratory distress. She has no wheezes.  Abdominal: Soft. Bowel sounds are normal. She exhibits no distension and no mass. There is no tenderness. There is no rebound and no guarding.  Musculoskeletal: She exhibits no edema or tenderness.  Lymphadenopathy:    She has no cervical adenopathy.  Neurological: She displays normal reflexes. No cranial nerve deficit. She exhibits normal  muscle tone. Coordination normal.  Skin: No rash noted. No erythema.  Psychiatric: She has a normal mood and affect. Her behavior is normal. Judgment and thought content normal.    Lab Results  Component Value Date   WBC 10.3 05/29/2015   HGB 14.2 05/29/2015   HCT 42.8 05/29/2015   PLT 266.0 05/29/2015   GLUCOSE 164 (H) 05/29/2015   CHOL 159 03/01/2013   TRIG 205.0 (H) 03/01/2013   HDL 44.30 03/01/2013   LDLDIRECT 91.5 03/01/2013   LDLCALC 92 11/06/2008   ALT 54 03/23/2015   AST 39 03/23/2015   NA 141 05/29/2015   K 3.9 05/29/2015   CL 102 05/29/2015   CREATININE 0.82 05/29/2015   BUN 14 05/29/2015   CO2 29 05/29/2015   TSH 3.51 01/30/2015   INR 1.1 (H) 03/01/2013   HGBA1C 6.4 03/01/2013   MICROALBUR 1.7 11/06/2008    Dg Chest 2 View  Result Date: 11/13/2015 CLINICAL DATA:  Cough, congestion, sob, headaches, 10 days,fever last week, HTN, non smoker. EXAM: CHEST  2 VIEW COMPARISON:  03/21/2015 and 03/09/2012. FINDINGS: There is anteromedial lung base opacity most consistent scarring, greater on the right. Lungs are hyperexpanded but otherwise clear. No pleural effusion or pneumothorax. Cardiac silhouette is normal in size.  The aorta is mildly uncoiled. No mediastinal or hilar masses or evidence of adenopathy. Bony thorax is demineralized but grossly intact. IMPRESSION: No acute cardiopulmonary disease. Electronically Signed   By: Lajean Manes M.D.   On: 11/13/2015 12:13    Assessment & Plan:   There are no diagnoses linked to this encounter. I have discontinued Ms. Sax's levofloxacin. I am also having her maintain her vitamin B-12, cholecalciferol, vitamin C, amLODipine, triamcinolone ointment, traZODone, diclofenac, traMADol, clopidogrel, cloNIDine, chlorpheniramine-HYDROcodone, atenolol, and SILENOR.  No orders of the defined types were placed in this encounter.    Follow-up: No Follow-up on file.  Walker Kehr, MD

## 2016-02-15 NOTE — Assessment & Plan Note (Signed)
Labs

## 2016-02-15 NOTE — Assessment & Plan Note (Signed)
CBC

## 2016-02-15 NOTE — Assessment & Plan Note (Signed)
On B12 

## 2016-02-15 NOTE — Assessment & Plan Note (Signed)
Norvasc, Atenolol, Catapress BP is ok at home - labile SBP= 110-190. SBP=140 at home

## 2016-02-15 NOTE — Progress Notes (Signed)
Pre visit review using our clinic review tool, if applicable. No additional management support is needed unless otherwise documented below in the visit note. 

## 2016-02-17 DIAGNOSIS — M810 Age-related osteoporosis without current pathological fracture: Secondary | ICD-10-CM | POA: Diagnosis not present

## 2016-03-11 ENCOUNTER — Other Ambulatory Visit: Payer: Self-pay | Admitting: Internal Medicine

## 2016-04-15 ENCOUNTER — Ambulatory Visit (INDEPENDENT_AMBULATORY_CARE_PROVIDER_SITE_OTHER): Payer: Medicare Other

## 2016-04-15 DIAGNOSIS — Z23 Encounter for immunization: Secondary | ICD-10-CM | POA: Diagnosis not present

## 2016-07-11 ENCOUNTER — Other Ambulatory Visit: Payer: Self-pay | Admitting: Internal Medicine

## 2016-07-23 ENCOUNTER — Other Ambulatory Visit: Payer: Self-pay | Admitting: Internal Medicine

## 2016-10-02 ENCOUNTER — Other Ambulatory Visit: Payer: Self-pay | Admitting: Internal Medicine

## 2016-10-15 ENCOUNTER — Telehealth: Payer: Self-pay | Admitting: Internal Medicine

## 2016-10-15 NOTE — Telephone Encounter (Signed)
Dentist called pt needs 2 teeth pulls and would like to know if Pt should stop taking plavix.    May call daughter as well.

## 2016-10-15 NOTE — Telephone Encounter (Signed)
No.Thx.

## 2016-10-15 NOTE — Telephone Encounter (Signed)
Routing to dr plotnikov, please advise, I will call patient back, thanks 

## 2016-10-16 NOTE — Telephone Encounter (Signed)
Ok to stop if the dentist needs it Risks are small Thx

## 2016-10-16 NOTE — Telephone Encounter (Signed)
Daughter, sue is stating that dentist told her most of time they stop plavix for a couple of days before procedure ----are you sure its ok to NOT stop plavix---please advise, I will call daughter back at [phone]

## 2016-10-17 NOTE — Telephone Encounter (Signed)
Left message advising of dr plotnikovs note, call back if any questions

## 2016-10-21 ENCOUNTER — Other Ambulatory Visit: Payer: Self-pay | Admitting: Internal Medicine

## 2016-10-21 NOTE — Telephone Encounter (Signed)
[name] called in and said that dentist needs that in writing..  Fax number [phone]  Dr Gae Bon office

## 2016-10-22 NOTE — Telephone Encounter (Signed)
Letter written and mailed, please call [name] when phones are back up.

## 2016-10-22 NOTE — Telephone Encounter (Signed)
Sandra [name] has talked with dr walrond---dentist---he is ok with patient stopping plavix for procedure---dr walrond states it's a little easier on patient if its stopped, to lessen oozing of blood from gums---I have advised that per dr Alain Marion, it is ok to stop plavix for procedure---we will be sending a letter to dr Gae Bon after fax machine lines are up again, procedure is not today and it is ok if dr Gae Bon receives letter via fax in the next few days

## 2016-10-23 NOTE — Telephone Encounter (Signed)
Daughter, sue advised that dr Gae Bon was given verbal order, written letter also mailed

## 2016-12-17 ENCOUNTER — Other Ambulatory Visit: Payer: Self-pay | Admitting: Internal Medicine

## 2017-01-13 ENCOUNTER — Other Ambulatory Visit: Payer: Self-pay | Admitting: Internal Medicine

## 2017-02-13 ENCOUNTER — Other Ambulatory Visit: Payer: Self-pay | Admitting: Internal Medicine

## 2017-02-24 ENCOUNTER — Other Ambulatory Visit: Payer: Self-pay | Admitting: Internal Medicine

## 2017-03-06 ENCOUNTER — Telehealth: Payer: Self-pay | Admitting: Internal Medicine

## 2017-03-06 MED ORDER — DOXEPIN HCL 3 MG PO TABS: 1.0000 | ORAL_TABLET | Freq: Every day | ORAL | 0 refills | Status: DC

## 2017-03-06 NOTE — Telephone Encounter (Signed)
Sent 30 day script only pt is due for her annual appt must see MD for refills.Marland KitchenJohny Chess

## 2017-03-06 NOTE — Telephone Encounter (Signed)
[place], [place] [phone] (Phone) [phone] (Fax)   Requesting a refill on this medication. Thank you.

## 2017-03-10 ENCOUNTER — Other Ambulatory Visit: Payer: Self-pay | Admitting: Internal Medicine

## 2017-03-20 ENCOUNTER — Other Ambulatory Visit: Payer: Self-pay | Admitting: Internal Medicine

## 2017-03-20 ENCOUNTER — Encounter: Payer: Self-pay | Admitting: Internal Medicine

## 2017-03-25 ENCOUNTER — Ambulatory Visit (INDEPENDENT_AMBULATORY_CARE_PROVIDER_SITE_OTHER): Payer: Medicare Other | Admitting: Internal Medicine

## 2017-03-25 ENCOUNTER — Encounter: Payer: Self-pay | Admitting: Internal Medicine

## 2017-03-25 ENCOUNTER — Other Ambulatory Visit (INDEPENDENT_AMBULATORY_CARE_PROVIDER_SITE_OTHER): Payer: Medicare Other

## 2017-03-25 VITALS — BP 166/94 | HR 80 | Temp 98.6°F | Ht 63.0 in | Wt 151.0 lb

## 2017-03-25 DIAGNOSIS — Z0000 Encounter for general adult medical examination without abnormal findings: Secondary | ICD-10-CM

## 2017-03-25 DIAGNOSIS — N179 Acute kidney failure, unspecified: Secondary | ICD-10-CM

## 2017-03-25 DIAGNOSIS — I10 Essential (primary) hypertension: Secondary | ICD-10-CM

## 2017-03-25 DIAGNOSIS — Z23 Encounter for immunization: Secondary | ICD-10-CM | POA: Diagnosis not present

## 2017-03-25 DIAGNOSIS — E538 Deficiency of other specified B group vitamins: Secondary | ICD-10-CM | POA: Diagnosis not present

## 2017-03-25 DIAGNOSIS — M5481 Occipital neuralgia: Secondary | ICD-10-CM

## 2017-03-25 DIAGNOSIS — Z7189 Other specified counseling: Secondary | ICD-10-CM | POA: Insufficient documentation

## 2017-03-25 DIAGNOSIS — M5440 Lumbago with sciatica, unspecified side: Secondary | ICD-10-CM | POA: Diagnosis not present

## 2017-03-25 LAB — URINALYSIS, ROUTINE W REFLEX MICROSCOPIC
Bilirubin Urine: NEGATIVE
HGB URINE DIPSTICK: NEGATIVE
NITRITE: NEGATIVE
Specific Gravity, Urine: 1.025 (ref 1.000–1.030)
URINE GLUCOSE: NEGATIVE
Urobilinogen, UA: 0.2 (ref 0.0–1.0)
pH: 6 (ref 5.0–8.0)

## 2017-03-25 LAB — HEPATIC FUNCTION PANEL
ALK PHOS: 59 U/L (ref 39–117)
ALT: 11 U/L (ref 0–35)
AST: 13 U/L (ref 0–37)
Albumin: 4.5 g/dL (ref 3.5–5.2)
BILIRUBIN DIRECT: 0.1 mg/dL (ref 0.0–0.3)
BILIRUBIN TOTAL: 0.6 mg/dL (ref 0.2–1.2)
Total Protein: 6.9 g/dL (ref 6.0–8.3)

## 2017-03-25 LAB — CBC WITH DIFFERENTIAL/PLATELET
Basophils Absolute: 0.1 10*3/uL (ref 0.0–0.1)
Basophils Relative: 1 % (ref 0.0–3.0)
EOS ABS: 0.1 10*3/uL (ref 0.0–0.7)
EOS PCT: 0.6 % (ref 0.0–5.0)
HCT: 45.1 % (ref 36.0–46.0)
HEMOGLOBIN: 15.2 g/dL — AB (ref 12.0–15.0)
Lymphocytes Relative: 29.3 % (ref 12.0–46.0)
Lymphs Abs: 2.6 10*3/uL (ref 0.7–4.0)
MCHC: 33.7 g/dL (ref 30.0–36.0)
MCV: 93.8 fl (ref 78.0–100.0)
Monocytes Absolute: 0.9 10*3/uL (ref 0.1–1.0)
Monocytes Relative: 9.6 % (ref 3.0–12.0)
Neutro Abs: 5.3 10*3/uL (ref 1.4–7.7)
Neutrophils Relative %: 59.5 % (ref 43.0–77.0)
Platelets: 208 10*3/uL (ref 150.0–400.0)
RBC: 4.8 Mil/uL (ref 3.87–5.11)
RDW: 13.7 % (ref 11.5–15.5)
WBC: 8.9 10*3/uL (ref 4.0–10.5)

## 2017-03-25 LAB — LIPID PANEL
CHOL/HDL RATIO: 6
CHOLESTEROL: 306 mg/dL — AB (ref 0–200)
HDL: 48.2 mg/dL (ref >39.00)

## 2017-03-25 LAB — BASIC METABOLIC PANEL
BUN: 22 mg/dL (ref 6–23)
CALCIUM: 9.6 mg/dL (ref 8.4–10.5)
CO2: 27 mEq/L (ref 19–32)
Chloride: 103 mEq/L (ref 96–112)
Creatinine, Ser: 0.98 mg/dL (ref 0.40–1.20)
GFR: 57.22 mL/min — ABNORMAL LOW (ref >60.00)
Glucose, Bld: 138 mg/dL — ABNORMAL HIGH (ref 70–99)
Potassium: 3.9 mEq/L (ref 3.5–5.1)
SODIUM: 139 meq/L (ref 135–145)

## 2017-03-25 LAB — TSH: TSH: 2.5 u[IU]/mL (ref 0.35–4.50)

## 2017-03-25 LAB — LDL CHOLESTEROL, DIRECT: LDL DIRECT: 161 mg/dL

## 2017-03-25 MED ORDER — DOXEPIN HCL 3 MG PO TABS: 1.0000 | ORAL_TABLET | Freq: Every day | ORAL | 11 refills | Status: AC

## 2017-03-25 MED ORDER — AMLODIPINE BESYLATE 10 MG PO TABS: 5.0000 mg | ORAL_TABLET | Freq: Every day | ORAL | 1 refills | Status: AC

## 2017-03-25 MED ORDER — CLONIDINE HCL 0.1 MG PO TABS: ORAL_TABLET | ORAL | 2 refills | Status: AC

## 2017-03-25 MED ORDER — TRAZODONE HCL 150 MG PO TABS: 150.0000 mg | ORAL_TABLET | Freq: Every day | ORAL | 2 refills | Status: AC

## 2017-03-25 MED ORDER — CLOPIDOGREL BISULFATE 75 MG PO TABS: 75.0000 mg | ORAL_TABLET | Freq: Every day | ORAL | 3 refills | Status: AC

## 2017-03-25 MED ORDER — ATENOLOL 100 MG PO TABS: 100.0000 mg | ORAL_TABLET | Freq: Two times a day (BID) | ORAL | 2 refills | Status: AC

## 2017-03-25 NOTE — Assessment & Plan Note (Signed)
Norvasc, Atenolol, Catapress

## 2017-03-25 NOTE — Patient Instructions (Signed)
MC well w/Jill 

## 2017-03-25 NOTE — Assessment & Plan Note (Signed)
No relapse 

## 2017-03-25 NOTE — Assessment & Plan Note (Signed)
On B12 

## 2017-03-25 NOTE — Assessment & Plan Note (Signed)
Labs

## 2017-03-25 NOTE — Progress Notes (Signed)
Subjective:  Patient ID: Sandra [name], female    DOB: 05/28/1929  Age: 83 y.o. MRN: [id number]  CC: No chief complaint on file.   HPI Sandra [name] presents for HTN, insomnia, OA f/u  Outpatient Medications Prior to Visit  Medication Sig Dispense Refill  . amLODipine (NORVASC) 10 MG tablet TAKE ONE-HALF TABLET BY MOUTH DAILY 135 tablet 1  . Ascorbic Acid (VITAMIN C) 100 MG tablet Take 100 mg by mouth daily.    Marland Kitchen atenolol (TENORMIN) 100 MG tablet Take 1 tablet (100 mg total) by mouth 2 (two) times daily. Annual appt is due must see provider for future refills 60 tablet 0  . Cholecalciferol (VITAMIN D3) 1000 UNITS tablet Take 1,000 Units by mouth daily.      . cloNIDine (CATAPRES) 0.1 MG tablet TAKE ONE TABLET BY MOUTH TWICE DAILY FOR sweating & blood pressure 180 tablet 1  . clopidogrel (PLAVIX) 75 MG tablet Take 1 tablet (75 mg total) by mouth daily. 90 tablet 3  . diclofenac (VOLTAREN) 50 MG EC tablet Take 1 tablet (50 mg total) by mouth 2 (two) times daily as needed for moderate pain. 60 tablet 1  . diclofenac sodium (VOLTAREN) 1 % GEL Apply 1 application topically 4 (four) times daily. 200 g 5  . Doxepin HCl (SILENOR) 3 MG TABS Take 1 tablet (3 mg total) by mouth at bedtime. Annual appt is due must see provider for future refills 30 tablet 0  . traMADol (ULTRAM) 50 MG tablet Take 1-2 tablets (50-100 mg total) by mouth 2 (two) times daily as needed for moderate pain. 60 tablet 0  . traZODone (DESYREL) 150 MG tablet TAKE ONE TABLET BY MOUTH AT BEDTIME 30 tablet 5  . triamcinolone ointment (KENALOG) 0.5 % APPLY TOPICALLY TWICE DAILY 90 g 0  . vitamin B-12 (CYANOCOBALAMIN) 1000 MCG tablet Take 1,000 mcg by mouth daily.       No facility-administered medications prior to visit.     ROS Review of Systems  Constitutional: Negative for activity change, appetite change, chills, fatigue and unexpected weight change.  HENT: Negative for congestion, mouth sores and sinus pressure.   Eyes:  Negative for visual disturbance.  Respiratory: Negative for cough and chest tightness.   Gastrointestinal: Negative for abdominal pain and nausea.  Genitourinary: Negative for difficulty urinating, frequency and vaginal pain.  Musculoskeletal: Negative for back pain and gait problem.  Skin: Negative for pallor and rash.  Neurological: Negative for dizziness, tremors, weakness, numbness and headaches.  Psychiatric/Behavioral: Positive for sleep disturbance. Negative for confusion.    Objective:  BP (!) 166/94 (BP Location: Left Arm, Patient Position: Sitting, Cuff Size: Large)   Pulse 80   Temp 98.6 F (37 C) (Oral)   Ht 5\' 3"  (1.6 m)   Wt 151 lb (68.5 kg)   SpO2 96%   BMI 26.75 kg/m   BP Readings from Last 3 Encounters:  03/25/17 (!) 166/94  02/15/16 (!) 170/80  11/13/15 130/80    Wt Readings from Last 3 Encounters:  03/25/17 151 lb (68.5 kg)  02/15/16 140 lb (63.5 kg)  11/13/15 144 lb (65.3 kg)    Physical Exam  Constitutional: She appears well-developed. No distress.  HENT:  Head: Normocephalic.  Right Ear: External ear normal.  Left Ear: External ear normal.  Nose: Nose normal.  Mouth/Throat: Oropharynx is clear and moist.  Eyes: Pupils are equal, round, and reactive to light. Conjunctivae are normal. Right eye exhibits no discharge. Left eye exhibits no discharge.  Neck: Normal range of motion. Neck supple. No JVD present. No tracheal deviation present. No thyromegaly present.  Cardiovascular: Normal rate, regular rhythm and normal heart sounds.   Pulmonary/Chest: No stridor. No respiratory distress. She has no wheezes.  Abdominal: Soft. Bowel sounds are normal. She exhibits no distension and no mass. There is no tenderness. There is no rebound and no guarding.  Musculoskeletal: She exhibits no edema or tenderness.  Lymphadenopathy:    She has no cervical adenopathy.  Neurological: She displays normal reflexes. No cranial nerve deficit. She exhibits normal muscle  tone. Coordination normal.  Skin: No rash noted. No erythema.  Psychiatric: She has a normal mood and affect. Her behavior is normal. Judgment and thought content normal.    Lab Results  Component Value Date   WBC 8.4 02/15/2016   HGB 14.0 02/15/2016   HCT 41.1 02/15/2016   PLT 258.0 02/15/2016   GLUCOSE 124 (H) 02/15/2016   CHOL 159 03/01/2013   TRIG 205.0 (H) 03/01/2013   HDL 44.30 03/01/2013   LDLDIRECT 91.5 03/01/2013   LDLCALC 92 11/06/2008   ALT 8 02/15/2016   AST 13 02/15/2016   NA 140 02/15/2016   K 3.9 02/15/2016   CL 103 02/15/2016   CREATININE 0.77 02/15/2016   BUN 16 02/15/2016   CO2 29 02/15/2016   TSH 3.51 01/30/2015   INR 1.1 (H) 03/01/2013   HGBA1C 6.6 (H) 02/15/2016   MICROALBUR 1.7 11/06/2008    Dg Bone Density  Result Date: 02/17/2016 Date of study: 02/15/16 Exam: DUAL X-RAY ABSORPTIOMETRY (DXA) FOR BONE MINERAL DENSITY (BMD) Instrument: [employer] Provider: PCP Indication: screening for osteoporosis Comparison: none (please note that it is not possible to compare data from different instruments) Clinical data: Pt is a 83 y.o. female without history of fracture. Results:  Lumbar spine (L1-L4) Femoral neck (FN) 33% distal radius T-score - 0.8 RFN: - 2.6 LFN: - 3.1 n/a Change in BMD from previous DXA test (%) n/a n/a n/a (*) statistically significant Assessment: Patient has OSTEOPOROSIS according to the [facility] classification for osteoporosis (see below). Fracture risk: high Comments: the technical quality of the study is good.  Degenerative changes in the spine may falsely elevate BMD. Evaluation for secondary causes should be considered if clinically indicated. Recommend optimizing calcium (1200 mg/day) and vitamin D (800 IU/day). Followup: Repeat BMD is appropriate after 2 years or after 1-2 years if starting treatment. WHO criteria for diagnosis of osteoporosis in postmenopausal women and in men 21 y/o or older: - normal: T-score -1.0 to + 1.0 -  osteopenia/low bone density: T-score between -2.5 and -1.0 - osteoporosis: T-score below -2.5 - severe osteoporosis: T-score below -2.5 with history of fragility fracture Note: although not part of the WHO classification, the presence of a fragility fracture, regardless of the T-score, should be considered diagnostic of osteoporosis, provided other causes for the fracture have been excluded. Treatment: The National Osteoporosis Foundation recommends that treatment be considered in postmenopausal women and men age 52 or older with: 1. Hip or vertebral (clinical or morphometric) fracture 2. T-score of - 2.5 or lower at the spine or hip 3. 10-year fracture probability by FRAX of at least 20% for a major osteoporotic fracture and 3% for a hip fracture Loura Pardon MD    Assessment & Plan:   There are no diagnoses linked to this encounter. I am having Ms. Etheredge maintain her vitamin B-12, cholecalciferol, vitamin C, triamcinolone ointment, diclofenac, traMADol, clopidogrel, diclofenac sodium, traZODone, amLODipine, atenolol, Doxepin  HCl, and cloNIDine.  No orders of the defined types were placed in this encounter.    Follow-up: No Follow-up on file.  Walker Kehr, MD

## 2017-03-25 NOTE — Assessment & Plan Note (Addendum)
Here for medicare wellness/physical  Diet: heart healthy  Physical activity: not sedentary  Depression/mood screen: negative  Hearing: intact to whispered voice  Visual acuity: grossly normal w/glasses, performs annual eye exam  ADLs: capable  Fall risk: low to none  Home safety: good  Cognitive evaluation: intact to orientation, naming, recall and repetition  EOL planning: adv directives, full code/ I agree  I have personally reviewed and have noted  1. The patient's medical, surgical and social history  2. Their use of alcohol, tobacco or illicit drugs  3. Their current medications and supplements  4. The patient's functional ability including ADL's, fall risks, home safety risks and hearing or visual impairment.  5. Diet and physical activities  6. Evidence for depression or mood disorders 7. The roster of all physicians providing medical care to patient - is listed in the Snapshot section of the chart and reviewed today.    Today patient counseled on age appropriate routine health concerns for screening and prevention, each reviewed and up to date or declined. Immunizations reviewed and up to date or declined. Labs ordered and reviewed. Risk factors for depression reviewed and negative. Hearing function and visual acuity are intact. ADLs screened and addressed as needed. Functional ability and level of safety reviewed and appropriate. Education, counseling and referrals performed based on assessed risks today. Patient provided with a copy of personalized plan for preventive services.   Pt declined BDS, [employer]

## 2017-03-25 NOTE — Addendum Note (Signed)
Addended by: Karren Cobble on: 03/25/2017 02:37 PM   Modules accepted: Orders

## 2017-03-25 NOTE — Assessment & Plan Note (Signed)
Tylenol prn 

## 2017-03-27 ENCOUNTER — Other Ambulatory Visit: Payer: Self-pay | Admitting: Internal Medicine

## 2017-04-02 ENCOUNTER — Encounter: Payer: Self-pay | Admitting: Internal Medicine

## 2017-04-03 ENCOUNTER — Encounter: Payer: Self-pay | Admitting: Internal Medicine

## 2017-04-14 ENCOUNTER — Encounter: Payer: Self-pay | Admitting: Internal Medicine

## 2017-06-20 ENCOUNTER — Other Ambulatory Visit: Payer: Self-pay | Admitting: Internal Medicine

## 2017-09-14 ENCOUNTER — Inpatient Hospital Stay (HOSPITAL_COMMUNITY)
Admission: EM | Admit: 2017-09-14 | Discharge: 2017-10-05 | DRG: 871 | Disposition: E | Payer: Medicare Other | Attending: Family Medicine | Admitting: Family Medicine

## 2017-09-14 ENCOUNTER — Ambulatory Visit (INDEPENDENT_AMBULATORY_CARE_PROVIDER_SITE_OTHER): Payer: Medicare Other | Admitting: Family Medicine

## 2017-09-14 ENCOUNTER — Encounter (HOSPITAL_COMMUNITY): Payer: Self-pay | Admitting: Emergency Medicine

## 2017-09-14 ENCOUNTER — Emergency Department (HOSPITAL_COMMUNITY): Payer: Medicare Other

## 2017-09-14 ENCOUNTER — Other Ambulatory Visit: Payer: Self-pay

## 2017-09-14 VITALS — BP 130/90 | HR 126 | Temp 98.1°F

## 2017-09-14 DIAGNOSIS — I248 Other forms of acute ischemic heart disease: Secondary | ICD-10-CM | POA: Diagnosis present

## 2017-09-14 DIAGNOSIS — J9600 Acute respiratory failure, unspecified whether with hypoxia or hypercapnia: Secondary | ICD-10-CM | POA: Diagnosis not present

## 2017-09-14 DIAGNOSIS — J189 Pneumonia, unspecified organism: Secondary | ICD-10-CM | POA: Diagnosis not present

## 2017-09-14 DIAGNOSIS — E538 Deficiency of other specified B group vitamins: Secondary | ICD-10-CM | POA: Diagnosis present

## 2017-09-14 DIAGNOSIS — Z978 Presence of other specified devices: Secondary | ICD-10-CM

## 2017-09-14 DIAGNOSIS — I5021 Acute systolic (congestive) heart failure: Secondary | ICD-10-CM | POA: Diagnosis not present

## 2017-09-14 DIAGNOSIS — Z880 Allergy status to penicillin: Secondary | ICD-10-CM

## 2017-09-14 DIAGNOSIS — I4891 Unspecified atrial fibrillation: Secondary | ICD-10-CM | POA: Diagnosis not present

## 2017-09-14 DIAGNOSIS — I429 Cardiomyopathy, unspecified: Secondary | ICD-10-CM | POA: Diagnosis not present

## 2017-09-14 DIAGNOSIS — Z888 Allergy status to other drugs, medicaments and biological substances status: Secondary | ICD-10-CM

## 2017-09-14 DIAGNOSIS — Z4659 Encounter for fitting and adjustment of other gastrointestinal appliance and device: Secondary | ICD-10-CM

## 2017-09-14 DIAGNOSIS — G9341 Metabolic encephalopathy: Secondary | ICD-10-CM | POA: Diagnosis not present

## 2017-09-14 DIAGNOSIS — E1142 Type 2 diabetes mellitus with diabetic polyneuropathy: Secondary | ICD-10-CM | POA: Diagnosis present

## 2017-09-14 DIAGNOSIS — I340 Nonrheumatic mitral (valve) insufficiency: Secondary | ICD-10-CM | POA: Diagnosis not present

## 2017-09-14 DIAGNOSIS — I10 Essential (primary) hypertension: Secondary | ICD-10-CM | POA: Diagnosis present

## 2017-09-14 DIAGNOSIS — I471 Supraventricular tachycardia: Secondary | ICD-10-CM | POA: Diagnosis not present

## 2017-09-14 DIAGNOSIS — Z515 Encounter for palliative care: Secondary | ICD-10-CM | POA: Diagnosis not present

## 2017-09-14 DIAGNOSIS — F329 Major depressive disorder, single episode, unspecified: Secondary | ICD-10-CM | POA: Diagnosis present

## 2017-09-14 DIAGNOSIS — Z66 Do not resuscitate: Secondary | ICD-10-CM | POA: Diagnosis not present

## 2017-09-14 DIAGNOSIS — J154 Pneumonia due to other streptococci: Secondary | ICD-10-CM | POA: Diagnosis present

## 2017-09-14 DIAGNOSIS — I447 Left bundle-branch block, unspecified: Secondary | ICD-10-CM | POA: Diagnosis not present

## 2017-09-14 DIAGNOSIS — J9690 Respiratory failure, unspecified, unspecified whether with hypoxia or hypercapnia: Secondary | ICD-10-CM

## 2017-09-14 DIAGNOSIS — I361 Nonrheumatic tricuspid (valve) insufficiency: Secondary | ICD-10-CM | POA: Diagnosis not present

## 2017-09-14 DIAGNOSIS — E785 Hyperlipidemia, unspecified: Secondary | ICD-10-CM | POA: Diagnosis present

## 2017-09-14 DIAGNOSIS — Z7189 Other specified counseling: Secondary | ICD-10-CM | POA: Diagnosis not present

## 2017-09-14 DIAGNOSIS — E559 Vitamin D deficiency, unspecified: Secondary | ICD-10-CM | POA: Diagnosis present

## 2017-09-14 DIAGNOSIS — M810 Age-related osteoporosis without current pathological fracture: Secondary | ICD-10-CM | POA: Diagnosis present

## 2017-09-14 DIAGNOSIS — N179 Acute kidney failure, unspecified: Secondary | ICD-10-CM | POA: Diagnosis not present

## 2017-09-14 DIAGNOSIS — R1319 Other dysphagia: Secondary | ICD-10-CM | POA: Diagnosis not present

## 2017-09-14 DIAGNOSIS — A419 Sepsis, unspecified organism: Secondary | ICD-10-CM

## 2017-09-14 DIAGNOSIS — R0600 Dyspnea, unspecified: Secondary | ICD-10-CM | POA: Diagnosis not present

## 2017-09-14 DIAGNOSIS — R570 Cardiogenic shock: Secondary | ICD-10-CM | POA: Diagnosis not present

## 2017-09-14 DIAGNOSIS — I469 Cardiac arrest, cause unspecified: Secondary | ICD-10-CM | POA: Diagnosis not present

## 2017-09-14 DIAGNOSIS — R6889 Other general symptoms and signs: Secondary | ICD-10-CM

## 2017-09-14 DIAGNOSIS — R0602 Shortness of breath: Secondary | ICD-10-CM | POA: Diagnosis present

## 2017-09-14 DIAGNOSIS — J9601 Acute respiratory failure with hypoxia: Secondary | ICD-10-CM | POA: Diagnosis not present

## 2017-09-14 DIAGNOSIS — Z8249 Family history of ischemic heart disease and other diseases of the circulatory system: Secondary | ICD-10-CM

## 2017-09-14 DIAGNOSIS — Z886 Allergy status to analgesic agent status: Secondary | ICD-10-CM

## 2017-09-14 DIAGNOSIS — I959 Hypotension, unspecified: Secondary | ICD-10-CM | POA: Diagnosis not present

## 2017-09-14 DIAGNOSIS — E861 Hypovolemia: Secondary | ICD-10-CM | POA: Diagnosis not present

## 2017-09-14 DIAGNOSIS — A403 Sepsis due to Streptococcus pneumoniae: Secondary | ICD-10-CM | POA: Diagnosis not present

## 2017-09-14 DIAGNOSIS — Z7902 Long term (current) use of antithrombotics/antiplatelets: Secondary | ICD-10-CM

## 2017-09-14 DIAGNOSIS — R609 Edema, unspecified: Secondary | ICD-10-CM | POA: Diagnosis not present

## 2017-09-14 DIAGNOSIS — E872 Acidosis: Secondary | ICD-10-CM | POA: Diagnosis not present

## 2017-09-14 DIAGNOSIS — J181 Lobar pneumonia, unspecified organism: Secondary | ICD-10-CM | POA: Diagnosis not present

## 2017-09-14 DIAGNOSIS — Z881 Allergy status to other antibiotic agents status: Secondary | ICD-10-CM

## 2017-09-14 LAB — CBC WITH DIFFERENTIAL/PLATELET
Basophils Absolute: 0 K/uL (ref 0.0–0.1)
Basophils Relative: 0 %
Eosinophils Absolute: 0 K/uL (ref 0.0–0.7)
Eosinophils Relative: 0 %
HCT: 43.4 % (ref 36.0–46.0)
Hemoglobin: 14.2 g/dL (ref 12.0–15.0)
Lymphocytes Relative: 3 %
Lymphs Abs: 0.6 K/uL — ABNORMAL LOW (ref 0.7–4.0)
MCH: 30.5 pg (ref 26.0–34.0)
MCHC: 32.7 g/dL (ref 30.0–36.0)
MCV: 93.3 fL (ref 78.0–100.0)
Monocytes Absolute: 1.5 K/uL — ABNORMAL HIGH (ref 0.1–1.0)
Monocytes Relative: 9 %
Neutro Abs: 15 K/uL — ABNORMAL HIGH (ref 1.7–7.7)
Neutrophils Relative %: 88 %
Platelets: 242 K/uL (ref 150–400)
RBC: 4.65 MIL/uL (ref 3.87–5.11)
RDW: 14.4 % (ref 11.5–15.5)
WBC: 17.1 K/uL — ABNORMAL HIGH (ref 4.0–10.5)

## 2017-09-14 LAB — URINALYSIS, ROUTINE W REFLEX MICROSCOPIC
Bilirubin Urine: NEGATIVE
Glucose, UA: 150 mg/dL — AB
Ketones, ur: 20 mg/dL — AB
Leukocytes, UA: NEGATIVE
Nitrite: NEGATIVE
Protein, ur: 100 mg/dL — AB
Specific Gravity, Urine: 1.02 (ref 1.005–1.030)
pH: 5 (ref 5.0–8.0)

## 2017-09-14 LAB — COMPREHENSIVE METABOLIC PANEL WITH GFR
ALT: 27 U/L (ref 14–54)
AST: 26 U/L (ref 15–41)
Albumin: 3.7 g/dL (ref 3.5–5.0)
Alkaline Phosphatase: 65 U/L (ref 38–126)
Anion gap: 12 (ref 5–15)
BUN: 19 mg/dL (ref 6–20)
CO2: 20 mmol/L — ABNORMAL LOW (ref 22–32)
Calcium: 8.8 mg/dL — ABNORMAL LOW (ref 8.9–10.3)
Chloride: 105 mmol/L (ref 101–111)
Creatinine, Ser: 0.88 mg/dL (ref 0.44–1.00)
GFR calc Af Amer: >60 mL/min
GFR calc non Af Amer: 58 mL/min — ABNORMAL LOW
Glucose, Bld: 222 mg/dL — ABNORMAL HIGH (ref 65–99)
Potassium: 3.8 mmol/L (ref 3.5–5.1)
Sodium: 137 mmol/L (ref 135–145)
Total Bilirubin: 1.9 mg/dL — ABNORMAL HIGH (ref 0.3–1.2)
Total Protein: 6.1 g/dL — ABNORMAL LOW (ref 6.5–8.1)

## 2017-09-14 LAB — D-DIMER, QUANTITATIVE (NOT AT ARMC): D DIMER QUANT: 0.81 ug{FEU}/mL — AB (ref 0.00–0.50)

## 2017-09-14 LAB — TROPONIN I: Troponin I: 0.1 ng/mL

## 2017-09-14 LAB — I-STAT CG4 LACTIC ACID, ED
Lactic Acid, Venous: 2.48 mmol/L (ref 0.5–1.9)
Lactic Acid, Venous: 1.92 mmol/L — ABNORMAL HIGH (ref 0.5–1.9)

## 2017-09-14 LAB — BRAIN NATRIURETIC PEPTIDE: B Natriuretic Peptide: 2708.6 pg/mL — ABNORMAL HIGH (ref 0.0–100.0)

## 2017-09-14 LAB — GLUCOSE, CAPILLARY: GLUCOSE-CAPILLARY: 170 mg/dL — AB (ref 65–99)

## 2017-09-14 LAB — INFLUENZA PANEL BY PCR (TYPE A & B)
Influenza A By PCR: NEGATIVE
Influenza B By PCR: NEGATIVE

## 2017-09-14 MED ORDER — VITAMIN D3 25 MCG (1000 UNIT) PO TABS
1000.0000 [IU] | ORAL_TABLET | Freq: Every day | ORAL | Status: DC
  Administered 2017-09-14 – 2017-09-22 (×5): 1000 [IU] via ORAL
  Filled 2017-09-14 (×5): qty 1

## 2017-09-14 MED ORDER — INSULIN ASPART 100 UNIT/ML ~~LOC~~ SOLN: 0.0000 [IU] | Freq: Every day | SUBCUTANEOUS | Status: DC

## 2017-09-14 MED ORDER — TRAMADOL HCL 50 MG PO TABS
50.0000 mg | ORAL_TABLET | Freq: Two times a day (BID) | ORAL | Status: DC | PRN
  Administered 2017-09-15: 50 mg via ORAL
  Administered 2017-09-19: 100 mg via ORAL
  Administered 2017-09-21: 50 mg via ORAL
  Filled 2017-09-14: qty 1
  Filled 2017-09-14: qty 2
  Filled 2017-09-14: qty 1

## 2017-09-14 MED ORDER — ALBUTEROL SULFATE (2.5 MG/3ML) 0.083% IN NEBU
5.0000 mg | INHALATION_SOLUTION | Freq: Once | RESPIRATORY_TRACT | Status: AC
  Administered 2017-09-14: 5 mg via RESPIRATORY_TRACT
  Filled 2017-09-14: qty 6

## 2017-09-14 MED ORDER — ACETAMINOPHEN 500 MG PO TABS
1000.0000 mg | ORAL_TABLET | Freq: Once | ORAL | Status: AC
  Administered 2017-09-14: 1000 mg via ORAL
  Filled 2017-09-14: qty 2

## 2017-09-14 MED ORDER — CLOPIDOGREL BISULFATE 75 MG PO TABS
75.0000 mg | ORAL_TABLET | Freq: Every day | ORAL | Status: DC
  Administered 2017-09-17: 75 mg via ORAL
  Filled 2017-09-14: qty 1

## 2017-09-14 MED ORDER — SODIUM CHLORIDE 0.9 % IV SOLN
INTRAVENOUS | Status: DC
  Administered 2017-09-14: 21:00:00 via INTRAVENOUS

## 2017-09-14 MED ORDER — ENOXAPARIN SODIUM 30 MG/0.3ML ~~LOC~~ SOLN
30.0000 mg | SUBCUTANEOUS | Status: DC
  Administered 2017-09-14: 30 mg via SUBCUTANEOUS
  Filled 2017-09-14: qty 0.3

## 2017-09-14 MED ORDER — INSULIN ASPART 100 UNIT/ML ~~LOC~~ SOLN
0.0000 [IU] | Freq: Three times a day (TID) | SUBCUTANEOUS | Status: DC
Start: 2017-09-15 — End: 2017-09-15

## 2017-09-14 MED ORDER — AMLODIPINE BESYLATE 5 MG PO TABS
5.0000 mg | ORAL_TABLET | Freq: Every day | ORAL | Status: DC
  Administered 2017-09-14: 5 mg via ORAL
  Filled 2017-09-14: qty 1

## 2017-09-14 MED ORDER — LEVOFLOXACIN IN D5W 750 MG/150ML IV SOLN
750.0000 mg | Freq: Once | INTRAVENOUS | Status: AC
  Administered 2017-09-14: 750 mg via INTRAVENOUS
  Filled 2017-09-14: qty 150

## 2017-09-14 MED ORDER — LEVOFLOXACIN IN D5W 750 MG/150ML IV SOLN: 750.0000 mg | INTRAVENOUS | Status: DC

## 2017-09-14 MED ORDER — DOXEPIN HCL 3 MG PO TABS: 1.0000 | ORAL_TABLET | Freq: Every day | ORAL | Status: DC

## 2017-09-14 MED ORDER — CLONIDINE HCL 0.1 MG PO TABS
0.1000 mg | ORAL_TABLET | Freq: Two times a day (BID) | ORAL | Status: DC
  Administered 2017-09-14: 0.1 mg via ORAL
  Filled 2017-09-14: qty 1

## 2017-09-14 MED ORDER — ATENOLOL 25 MG PO TABS
100.0000 mg | ORAL_TABLET | Freq: Two times a day (BID) | ORAL | Status: DC
  Administered 2017-09-14: 100 mg via ORAL
  Filled 2017-09-14: qty 2

## 2017-09-14 MED ORDER — TRAZODONE HCL 50 MG PO TABS
150.0000 mg | ORAL_TABLET | Freq: Every day | ORAL | Status: DC
  Administered 2017-09-14 – 2017-09-22 (×5): 150 mg via ORAL
  Filled 2017-09-14 (×2): qty 1
  Filled 2017-09-14 (×2): qty 3
  Filled 2017-09-14: qty 1
  Filled 2017-09-14: qty 3
  Filled 2017-09-14: qty 1

## 2017-09-14 MED ORDER — SODIUM CHLORIDE 0.9 % IV BOLUS (SEPSIS)
1000.0000 mL | Freq: Once | INTRAVENOUS | Status: AC
  Administered 2017-09-14: 1000 mL via INTRAVENOUS

## 2017-09-14 NOTE — ED Notes (Addendum)
Date and time results received: 09/27/2017 1920 (use smartphrase ".now" to insert current time)  Test: troponin Critical Value: 0.10 Notified J. Maudie Mercury

## 2017-09-14 NOTE — Patient Instructions (Signed)
Please go to the emergency room so that you may be evaluated sooner.

## 2017-09-14 NOTE — Progress Notes (Signed)
Sandra [name] - 84 y.o. female MRN [id number]  Date of birth: 1929/01/06  SUBJECTIVE:  Including CC & ROS.  Chief Complaint  Patient presents with  . Diarrhea    body aches, cough, SOB,:     Sandra [name] is a 84 y.o. female that is presenting with shortness of breath. She has been hang tachypnea and trouble breathing for the past week. It has gotten worse today. She denies any history of blood clots. She denies any leg swelling. She has no history of asthma or COPD. Has not had any fevers. Feels like her symptoms are getting worse. She is accompanied by her daughter who provides history. No coughing up any blood..  Review of echo from 2016 shows normal ejection fraction with grade 2 diastolic dysfunction.   Review of Systems  Constitutional: Negative for fever.  HENT: Negative for congestion.   Respiratory: Positive for cough and shortness of breath.   Cardiovascular: Negative for chest pain.  Gastrointestinal: Negative for abdominal pain.    HISTORY: Past Medical, Surgical, Social, and Family History Reviewed & Updated per EMR.   Pertinent Historical Findings include:  Past Medical History:  Diagnosis Date  . Anxiety   . Colon polyps   . HTN (hypertension)   . Hyperlipidemia   . LBP (low back pain) 2011   right  . Osteoporosis   . Peripheral neuropathy   . Strabismus    L eye  . Vitamin B12 deficiency   . Vitamin D deficiency     Past Surgical History:  Procedure Laterality Date  . ABDOMINAL HYSTERECTOMY        Family History  Problem Relation Age of Onset  . Hypertension Unknown   . Heart disease Mother 13       chf  . Early death Father   . Heart disease Father 34       mi  . Diabetes Sister   . Heart disease Sister        chf     Social History   Socioeconomic History  . Marital status: Widowed    Spouse name: Not on file  . Number of children: Not on file  . Years of education: Not on file  . Highest education level: Not on file  Social  Needs  . Financial resource strain: Not on file  . Food insecurity - worry: Not on file  . Food insecurity - inability: Not on file  . Transportation needs - medical: Not on file  . Transportation needs - non-medical: Not on file  Occupational History  . Occupation: Retired    Comment: Therapist, art  Tobacco Use  . Smoking status: Never Smoker  . Smokeless tobacco: Never Used  Substance and Sexual Activity  . Alcohol use: No  . Drug use: No  . Sexual activity: No  Other Topics Concern  . Not on file  Social History Narrative   Single widow   Retired   Occupation: Therapist, art     PHYSICAL EXAM:  VS: BP 130/90 (BP Location: Left Arm, Patient Position: Sitting, Cuff Size: Normal)   Pulse (!) 126   Temp 98.1 F (36.7 C) (Oral)   SpO2 92%  Physical Exam Gen: NAD, alert, cooperative with exam,  ENT: normal lips, normal nasal mucosa,  Eye: normal EOM, normal conjunctiva and lids CV:  no edema, +2 pedal pulses, tachycardia, S1-S2   Resp: no accessory muscle use, non-labored,no crackles or wheezes  Skin: no rashes, no areas of induration  Neuro: normal tone, normal sensation to touch Psych:  normal insight, alert and oriented MSK: sitting in wheelchair,     ASSESSMENT & PLAN:   Shortness of breath Concern that she may have a pulmonary embolism with no prior history of lung disease. There is no crackles on exam or fever to suspect pneumonia. Patient is breathing heavily despite sitting in the wheelchair today. - advised to be seen in the emergency department for the consideration of a CT angiogram of her chest.

## 2017-09-14 NOTE — Assessment & Plan Note (Signed)
Concern that she may have a pulmonary embolism with no prior history of lung disease. There is no crackles on exam or fever to suspect pneumonia. Patient is breathing heavily despite sitting in the wheelchair today. - advised to be seen in the emergency department for the consideration of a CT angiogram of her chest.

## 2017-09-14 NOTE — H&P (Addendum)
TRH H&P   Patient Demographics:    Sandra [name], is a 84 y.o. female  MRN: [id number]   DOB - [birth year]  Admit Date - 09/07/2017  Outpatient Primary MD for the patient is Plotnikov, Evie Lacks, MD  Referring MD/NP/PA:  Lindell Noe  Outpatient Specialists:   Patient coming from: home  Chief Complaint  Patient presents with  . Shortness of Breath      HPI:    Sandra [name]  is a 84 y.o. female,w h/o hypertension, hyperlipidemia, c/o dry cough that has worsened over the past 2 weeks.  Slight dyspnea.  Pt denies fever, chills, cp, palp, n/v, diarrhea, brbpr.  Pt was apparently was slightly confused and dyspneic and therefore her family brought her to ER   In Ed,  CXR IMPRESSION: Bilateral effusions, patchy infiltrate/atelectasis in both lower lungs. Findings more consistent with pneumonia, but there could be an element of congestive heart failure.  Na 137, K 3.8, Bun 19 , Creatinine 0.88 Glucose 222 Ast 26, Alt 27 Al phos 65, T. Bili 1.9  Lactic acid 2.48 Trop I 0.1  (no chest pain)  BNP 2, 708.6 Influenza negative Wbc 17.1, hgb 14.2, Plt 242  ST at 115, nl axis, slight st depression in v5,6  Pt will be admitted for pneumonia, and troponin elevation.    Review of systems:    In addition to the HPI above,  No Fever-chills, No Headache, No changes with Vision or hearing, No problems swallowing food or Liquids, No Chest pain, No Abdominal pain, No Nausea or Vommitting, Bowel movements are regular, No Blood in stool or Urine, No dysuria, No new skin rashes or bruises, No new joints pains-aches,  No new weakness, tingling, numbness in any extremity, No recent weight gain or loss, No polyuria, polydypsia or polyphagia, No significant Mental Stressors.  A full 10 point Review of Systems was done, except as stated above, all other Review of Systems were  negative.   With Past History of the following :    Past Medical History:  Diagnosis Date  . Anxiety   . Colon polyps   . HTN (hypertension)   . Hyperlipidemia   . LBP (low back pain) 2011   right  . Osteoporosis   . Peripheral neuropathy   . Strabismus    L eye  . Vitamin B12 deficiency   . Vitamin D deficiency       Past Surgical History:  Procedure Laterality Date  . ABDOMINAL HYSTERECTOMY        Social History:     Social History   Tobacco Use  . Smoking status: Never Smoker  . Smokeless tobacco: Never Used  Substance Use Topics  . Alcohol use: No     Lives - at home,   Mobility - walks by self   Family History :     Family History  Problem Relation Age  of Onset  . Heart disease Mother 11       chf  . Early death Father   . Heart disease Father 56       mi  . Diabetes Sister   . Heart disease Sister        chf  . Hypertension Unknown       Home Medications:   Prior to Admission medications   Medication Sig Start Date End Date Taking? Authorizing Provider  acetaminophen (TYLENOL) 500 MG tablet Take 500 mg by mouth every 6 (six) hours as needed for moderate pain.   Yes [provider]  amLODipine (NORVASC) 10 MG tablet Take 0.5 tablets (5 mg total) by mouth daily. 03/25/17  Yes Plotnikov, Evie Lacks, MD  Ascorbic Acid (VITAMIN C) 100 MG tablet Take 100 mg by mouth daily.   Yes [provider]  atenolol (TENORMIN) 100 MG tablet Take 1 tablet (100 mg total) by mouth 2 (two) times daily. Annual appt is due must see provider for future refills 03/25/17  Yes Plotnikov, Evie Lacks, MD  Cholecalciferol (VITAMIN D3) 1000 UNITS tablet Take 1,000 Units by mouth daily.     Yes [provider]  cloNIDine (CATAPRES) 0.1 MG tablet TAKE ONE TABLET BY MOUTH TWICE DAILY FOR sweating & blood pressure 03/25/17  Yes Plotnikov, Evie Lacks, MD  clopidogrel (PLAVIX) 75 MG tablet Take 1 tablet (75 mg total) by mouth daily. 03/25/17  Yes Plotnikov,  Evie Lacks, MD  diclofenac (VOLTAREN) 50 MG EC tablet Take 1 tablet (50 mg total) by mouth 2 (two) times daily as needed for moderate pain. 08/27/15  Yes Plotnikov, Evie Lacks, MD  diclofenac sodium (VOLTAREN) 1 % GEL Apply 1 application topically 4 (four) times daily. 02/15/16  Yes Plotnikov, Evie Lacks, MD  Doxepin HCl (SILENOR) 3 MG TABS Take 1 tablet (3 mg total) by mouth at bedtime. Annual appt is due must see provider for future refills 03/25/17  Yes Plotnikov, Evie Lacks, MD  SILENOR 3 MG TABS TAKE ONE TABLET BY MOUTH DAILY AT BEDTIME 06/22/17  Yes Plotnikov, Evie Lacks, MD  traMADol (ULTRAM) 50 MG tablet Take 1-2 tablets (50-100 mg total) by mouth 2 (two) times daily as needed for moderate pain. 08/27/15  Yes Plotnikov, Evie Lacks, MD  traZODone (DESYREL) 150 MG tablet Take 1 tablet (150 mg total) by mouth at bedtime. 03/25/17  Yes Plotnikov, Evie Lacks, MD  triamcinolone ointment (KENALOG) 0.5 % APPLY TOPICALLY TWICE DAILY 07/06/15  Yes Plotnikov, Evie Lacks, MD  vitamin B-12 (CYANOCOBALAMIN) 1000 MCG tablet Take 1,000 mcg by mouth daily.     Yes [provider]     Allergies:     See Epic   Physical Exam:   Vitals  Blood pressure (!) 159/87, pulse 100, temperature 99.1 F (37.3 C), temperature source Oral, resp. rate 20, height 5\' 2"  (1.575 m), weight 63.5 kg (140 lb), SpO2 98 %.   1. General lying in bed in NAD,   2. Normal affect and insight, Not Suicidal or Homicidal, Awake Alert, Oriented X 3.  3. No F.N deficits, ALL C.Nerves Intact, Strength 5/5 all 4 extremities, Sensation intact all 4 extremities, Plantars down going.  4. Ears and Eyes appear Normal, Conjunctivae clear, PERRLA. Moist Oral Mucosa.  5. Supple Neck, No JVD, No cervical lymphadenopathy appriciated, No Carotid Bruits.  6. Symmetrical Chest wall movement,+ bilateral crackles (basilar), no wheezing  7. RRR, No Gallops, Rubs or Murmurs, No Parasternal Heave.  8. Positive Bowel Sounds, Abdomen  Soft, No  tenderness, No organomegaly appriciated,No rebound -guarding or rigidity.  9.  No Cyanosis, Normal Skin Turgor, No Skin Rash or Bruise.  10. Good muscle tone,  joints appear normal , no effusions, Normal ROM.  11. No Palpable Lymph Nodes in Neck or Axillae    Data Review:    CBC Recent Labs  Lab 09/16/2017 1559  WBC 17.1*  HGB 14.2  HCT 43.4  PLT 242  MCV 93.3  MCH 30.5  MCHC 32.7  RDW 14.4  LYMPHSABS 0.6*  MONOABS 1.5*  EOSABS 0.0  BASOSABS 0.0   ------------------------------------------------------------------------------------------------------------------  Chemistries  Recent Labs  Lab 09/26/2017 1559  NA 137  K 3.8  CL 105  CO2 20*  GLUCOSE 222*  BUN 19  CREATININE 0.88  CALCIUM 8.8*  AST 26  ALT 27  ALKPHOS 65  BILITOT 1.9*   ------------------------------------------------------------------------------------------------------------------ estimated creatinine clearance is 40.2 mL/min (by C-G formula based on SCr of 0.88 mg/dL). ------------------------------------------------------------------------------------------------------------------ No results for input(s): TSH, T4TOTAL, T3FREE, THYROIDAB in the last 72 hours.  Invalid input(s): FREET3  Coagulation profile No results for input(s): INR, PROTIME in the last 168 hours. ------------------------------------------------------------------------------------------------------------------- No results for input(s): DDIMER in the last 72 hours. -------------------------------------------------------------------------------------------------------------------  Cardiac Enzymes Recent Labs  Lab 09/11/2017 1713  TROPONINI 0.10*   ------------------------------------------------------------------------------------------------------------------    Component Value Date/Time   BNP 2,708.6 (H) 10/03/2017 1713      ---------------------------------------------------------------------------------------------------------------  Urinalysis    Component Value Date/Time   COLORURINE YELLOW 09/07/2017 1850   APPEARANCEUR CLEAR 09/07/2017 1850   LABSPEC 1.020 09/30/2017 1850   PHURINE 5.0 10/02/2017 1850   GLUCOSEU 150 (A) 09/12/2017 1850   GLUCOSEU NEGATIVE 03/25/2017 1437   HGBUR MODERATE (A) 09/28/2017 1850   BILIRUBINUR NEGATIVE 09/21/2017 1850   KETONESUR 20 (A) 09/24/2017 1850   PROTEINUR 100 (A) 09/08/2017 1850   UROBILINOGEN 0.2 03/25/2017 1437   NITRITE NEGATIVE 10/04/2017 1850   LEUKOCYTESUR NEGATIVE 09/24/2017 1850    ----------------------------------------------------------------------------------------------------------------   Imaging Results:    Dg Chest 2 View  Result Date: 09/09/2017 CLINICAL DATA:  Shortness of breath with poor oxygen saturation and tachycardia. EXAM: CHEST - 2 VIEW COMPARISON:  11/13/2015 FINDINGS: The heart is enlarged. There is aortic atherosclerosis. There are bilateral pleural effusions with dependent atelectasis. There is patchy density in both lower lobes. Upper lobes are clear. The findings are more consistent with pneumonia, but there could also be an element of congestive heart failure. IMPRESSION: Bilateral effusions, patchy infiltrate/atelectasis in both lower lungs. Findings more consistent with pneumonia, but there could be an element of congestive heart failure. Electronically Signed   By: Nelson Chimes M.D.   On: 09/10/2017 16:34      Assessment & Plan:    Principal Problem:   CAP (community acquired pneumonia)    Cap Blood culture x2 Sputum gram stain, culture  Urine strep antigen , urine legionella antigen levaquin iv pharmacy to dose  Troponin elevation ? Demand ischemia  Tele Trop I q6h x3 Check cardiac echo  Tachycardia Check TSH Trop I q6h x3 D dimer, if positive then CTA chest r/o PE  Hypertension Cont Atenolol  100mg  poq day Cont amlodipine 5mg  po qday Cont clonidine  Dm2 (?),  Bs=222 Check hga1c fsbs ac and qhs, ISS    DVT Prophylaxis  Lovenox - SCDs  AM Labs Ordered, also please review Full Orders  Family Communication: Admission, patients condition and plan of care including tests being ordered have been discussed with the patient  who indicate  understanding and agree with the plan and Code Status.  Code Status FULL CODE  Likely DC to  home  Condition GUARDED    Consults called: none  Admission status: inpatient   Time spent in minutes : 45   Jani Gravel M.D on 10/04/2017 at 10:47 PM  Between 7am to 7pm - Pager - [phone]   After 7pm go to [email or web address] - password [facility]  [facility] - Office  [phone]

## 2017-09-14 NOTE — ED Notes (Signed)
Patient transported to X-ray 

## 2017-09-14 NOTE — ED Provider Notes (Addendum)
[place] DEPT Provider Note   CSN: [id number] Arrival date & time: 09/30/2017  1529     History   Chief Complaint Chief Complaint  Patient presents with  . Shortness of Breath    HPI Sandra [name] is a 84 y.o. female.  HPI 84 year old female with past medical history as below including hypertension and hyperlipidemia here with shortness of breath.  The patient states that over the last 2 and half weeks, she is had progressive worsening cough, and shortness of breath.  The cough has been dry.  She is had no sputum production.  Her symptoms have progressively worsened.  Over the last several days, her cough has worsened and she has become extremely short of breath.  She is now short of breath at rest.  Family is noticed increased confusion as well as increased work of breathing.  She is having difficulty getting around the house.  She lives alone.  Per family, there is been increased confusion.  No known falls.  No recent head trauma.  She has had some subjective fevers and chills.  No myalgias.  No abdominal pain.  No other medical complaints.  Past Medical History:  Diagnosis Date  . Anxiety   . Colon polyps   . HTN (hypertension)   . Hyperlipidemia   . LBP (low back pain) 2011   right  . Osteoporosis   . Peripheral neuropathy   . Strabismus    L eye  . Vitamin B12 deficiency   . Vitamin D deficiency     Patient Active Problem List   Diagnosis Date Noted  . Shortness of breath 09/13/2017  . Well adult exam 03/25/2017  . Anemia 02/15/2016  . Fatigue 11/13/2015  . Pain in joint, shoulder region 08/27/2015  . Duodenitis without bleeding 03/23/2015  . Fall at home 03/19/2015  . Facial trauma-right side 03/19/2015  . Nausea vomiting and diarrhea 03/19/2015  . Dehydration 03/19/2015  . Acute kidney injury ([place]) 03/19/2015  . Constipation 02/14/2015  . Peripheral neuropathy 02/03/2015  . Headache 01/30/2015  . Acute confusional state  01/30/2015  . Trigeminal neuralgia of right side of face 01/24/2015  . Sweats, menopausal 06/19/2014  . Neoplasm of uncertain behavior of skin 06/15/2013  . Cystitis 03/16/2013  . Bruising 02/28/2013  . Diarrhea 11/11/2012  . Eczema 04/26/2012  . Need for prophylactic vaccination and inoculation against influenza 04/26/2012  . Fever 03/09/2012  . Ataxia 01/20/2012  . Staggering 01/12/2012  . Occipital neuralgia 06/25/2011  . Grief reaction 01/13/2011  . Chest pain at rest 01/13/2011  . Paresthesia 11/13/2010  . LOW BACK PAIN 04/17/2010  . WHEEZING 06/07/2009  . TOBACCO USE, QUIT 05/25/2009  . BRONCHITIS, ACUTE 07/19/2008  . Cough 07/19/2008  . CONTUSION, LOWER LEG, LEFT 09/27/2007  . B12 deficiency 09/15/2007  . PERIPHERAL NEUROPATHY 09/15/2007  . Vitamin D deficiency 06/14/2007  . Anxiety state 06/14/2007  . ABNORMAL GLUCOSE NEC 06/14/2007  . Dyslipidemia 05/21/2007  . Essential hypertension 05/21/2007  . OSTEOPOROSIS 05/21/2007  . Insomnia 05/21/2007  . COLONIC POLYPS, HX OF 05/21/2007    Past Surgical History:  Procedure Laterality Date  . ABDOMINAL HYSTERECTOMY      OB History    No data available       Home Medications    Prior to Admission medications   Medication Sig Start Date End Date Taking? Authorizing Provider  acetaminophen (TYLENOL) 500 MG tablet Take 500 mg by mouth every 6 (six) hours as needed for moderate  pain.   Yes [provider]  amLODipine (NORVASC) 10 MG tablet Take 0.5 tablets (5 mg total) by mouth daily. 03/25/17  Yes Plotnikov, Evie Lacks, MD  Ascorbic Acid (VITAMIN C) 100 MG tablet Take 100 mg by mouth daily.   Yes [provider]  atenolol (TENORMIN) 100 MG tablet Take 1 tablet (100 mg total) by mouth 2 (two) times daily. Annual appt is due must see provider for future refills 03/25/17  Yes Plotnikov, Evie Lacks, MD  Cholecalciferol (VITAMIN D3) 1000 UNITS tablet Take 1,000 Units by mouth daily.     Yes [provider]  cloNIDine (CATAPRES) 0.1 MG tablet TAKE ONE TABLET BY MOUTH TWICE DAILY FOR sweating & blood pressure 03/25/17  Yes Plotnikov, Evie Lacks, MD  clopidogrel (PLAVIX) 75 MG tablet Take 1 tablet (75 mg total) by mouth daily. 03/25/17  Yes Plotnikov, Evie Lacks, MD  diclofenac (VOLTAREN) 50 MG EC tablet Take 1 tablet (50 mg total) by mouth 2 (two) times daily as needed for moderate pain. 08/27/15  Yes Plotnikov, Evie Lacks, MD  diclofenac sodium (VOLTAREN) 1 % GEL Apply 1 application topically 4 (four) times daily. 02/15/16  Yes Plotnikov, Evie Lacks, MD  Doxepin HCl (SILENOR) 3 MG TABS Take 1 tablet (3 mg total) by mouth at bedtime. Annual appt is due must see provider for future refills 03/25/17  Yes Plotnikov, Evie Lacks, MD  SILENOR 3 MG TABS TAKE ONE TABLET BY MOUTH DAILY AT BEDTIME 06/22/17  Yes Plotnikov, Evie Lacks, MD  traMADol (ULTRAM) 50 MG tablet Take 1-2 tablets (50-100 mg total) by mouth 2 (two) times daily as needed for moderate pain. 08/27/15  Yes Plotnikov, Evie Lacks, MD  traZODone (DESYREL) 150 MG tablet Take 1 tablet (150 mg total) by mouth at bedtime. 03/25/17  Yes Plotnikov, Evie Lacks, MD  triamcinolone ointment (KENALOG) 0.5 % APPLY TOPICALLY TWICE DAILY 07/06/15  Yes Plotnikov, Evie Lacks, MD  vitamin B-12 (CYANOCOBALAMIN) 1000 MCG tablet Take 1,000 mcg by mouth daily.     Yes [provider]    Family History Family History  Problem Relation Age of Onset  . Heart disease Mother 32       chf  . Early death Father   . Heart disease Father 16       mi  . Diabetes Sister   . Heart disease Sister        chf  . Hypertension Unknown     Social History Social History   Tobacco Use  . Smoking status: Never Smoker  . Smokeless tobacco: Never Used  Substance Use Topics  . Alcohol use: No  . Drug use: No     Allergies   Prednisone; Aspirin; Azithromycin; Enalapril maleate; Penicillins; and Trazodone and nefazodone   Review of Systems Review of Systems    Constitutional: Positive for fatigue.  Respiratory: Positive for cough and shortness of breath.   Gastrointestinal: Positive for diarrhea and nausea.  All other systems reviewed and are negative.    Physical Exam Updated Vital Signs BP (!) 180/99   Pulse (!) 108   Temp (!) 100.9 F (38.3 C) (Rectal)   Resp (!) 24   Ht 5\' 2"  (1.575 m)   Wt 63.5 kg (140 lb)   SpO2 97%   BMI 25.61 kg/m   Physical Exam  Constitutional: She is oriented to person, place, and time. She appears well-developed and well-nourished. She appears distressed.  HENT:  Head: Normocephalic and atraumatic.  Eyes: Conjunctivae are normal.  Neck: Neck supple.  Cardiovascular: Normal rate, regular rhythm and normal heart sounds. Exam reveals no friction rub.  No murmur heard. Pulmonary/Chest: Accessory muscle usage present. Tachypnea noted. No respiratory distress. She has decreased breath sounds. She has wheezes. She has rhonchi in the right lower field and the left lower field. She has rales in the right lower field and the left lower field.  Abdominal: She exhibits no distension.  Musculoskeletal: She exhibits edema (1+ pitting bilateral lower extremities).  Neurological: She is alert and oriented to person, place, and time. She exhibits normal muscle tone.  Skin: Skin is warm. Capillary refill takes less than 2 seconds.  Psychiatric: She has a normal mood and affect.  Nursing note and vitals reviewed.    ED Treatments / Results  Labs (all labs ordered are listed, but only abnormal results are displayed) Labs Reviewed  COMPREHENSIVE METABOLIC PANEL - Abnormal; Notable for the following components:      Result Value   CO2 20 (*)    Glucose, Bld 222 (*)    Calcium 8.8 (*)    Total Protein 6.1 (*)    Total Bilirubin 1.9 (*)    GFR calc non Af Amer 58 (*)    All other components within normal limits  CBC WITH DIFFERENTIAL/PLATELET - Abnormal; Notable for the following components:   WBC 17.1 (*)     Neutro Abs 15.0 (*)    Lymphs Abs 0.6 (*)    Monocytes Absolute 1.5 (*)    All other components within normal limits  I-STAT CG4 LACTIC ACID, ED - Abnormal; Notable for the following components:   Lactic Acid, Venous 2.48 (*)    All other components within normal limits  I-STAT CG4 LACTIC ACID, ED - Abnormal; Notable for the following components:   Lactic Acid, Venous 1.92 (*)    All other components within normal limits  CULTURE, BLOOD (ROUTINE X 2)  CULTURE, BLOOD (ROUTINE X 2)  URINE CULTURE  URINALYSIS, ROUTINE W REFLEX MICROSCOPIC  TROPONIN I  BRAIN NATRIURETIC PEPTIDE  INFLUENZA PANEL BY PCR (TYPE A & B)    EKG  EKG Interpretation  Date/Time:  Monday September 14 2017 15:44:28 EDT Ventricular Rate:  115 PR Interval:    QRS Duration: 106 QT Interval:  340 QTC Calculation: 471 R Axis:   97 Text Interpretation:  Sinus tachycardia Right axis deviation Nonspecific repol abnormality, diffuse leads Since last EKG, ST changes noted inferolateral leads Confirmed by Duffy Bruce [id number]) on 09/28/2017 5:03:35 PM       Radiology Dg Chest 2 View  Result Date: 09/26/2017 CLINICAL DATA:  Shortness of breath with poor oxygen saturation and tachycardia. EXAM: CHEST - 2 VIEW COMPARISON:  11/13/2015 FINDINGS: The heart is enlarged. There is aortic atherosclerosis. There are bilateral pleural effusions with dependent atelectasis. There is patchy density in both lower lobes. Upper lobes are clear. The findings are more consistent with pneumonia, but there could also be an element of congestive heart failure. IMPRESSION: Bilateral effusions, patchy infiltrate/atelectasis in both lower lungs. Findings more consistent with pneumonia, but there could be an element of congestive heart failure. Electronically Signed   By: Nelson Chimes M.D.   On: 09/30/2017 16:34    Procedures .Critical Care Performed by: Duffy Bruce, MD Authorized by: Duffy Bruce, MD   Critical care provider statement:      Critical care time (minutes):  35   Critical care time was exclusive of:  Separately billable procedures and treating other patients and teaching  time   Critical care was necessary to treat or prevent imminent or life-threatening deterioration of the following conditions:  Sepsis, circulatory failure and dehydration   Critical care was time spent personally by me on the following activities:  Development of treatment plan with patient or surrogate, discussions with consultants, evaluation of patient's response to treatment, examination of patient, obtaining history from patient or surrogate, ordering and performing treatments and interventions, ordering and review of laboratory studies, ordering and review of radiographic studies, pulse oximetry, re-evaluation of patient's condition and review of old charts   I assumed direction of critical care for this patient from another provider in my specialty: no     (including critical care time)  Medications Ordered in ED Medications  levofloxacin (LEVAQUIN) IVPB 750 mg (750 mg Intravenous New Bag/Given 09/05/2017 1729)  sodium chloride 0.9 % bolus 1,000 mL (1,000 mLs Intravenous New Bag/Given 09/30/2017 1728)  acetaminophen (TYLENOL) tablet 1,000 mg (not administered)  albuterol (PROVENTIL) (2.5 MG/3ML) 0.083% nebulizer solution 5 mg (5 mg Nebulization Given 09/08/2017 1546)     Initial Impression / Assessment and Plan / ED Course  I have reviewed the triage vital signs and the nursing notes.  Pertinent labs & imaging results that were available during my care of the patient were reviewed by me and considered in my medical decision making (see chart for details).     84 yo F with PMHx as above here with cough, SOB. Pt tachycardic, febrile, and borderline hypoxic.  Lab work shows lactic acidosis and leukocytosis.  Concern for sepsis secondary to pneumonia.  Chest x-ray confirms bilateral pneumonia.  Patient also has possible component of CHF.  She does  have some pitting edema bilateral lower extremities. Given that she does not have severe sepsis but does have fever/lactic acidosis with bilateral PNA, will plan to give 1 L and reassess as I am hesitant to fluid overload her given her tenuous respiratory status.  EF 2016 was 55-60%, no WMA. Otherwise, full sepsis protocol initiated.  On sepsis reevaluation, patient appears to be improving.  She continues to [facility] well. RR has improved from 30s to 18-20s. Perfusion improved. Lungs with persistent rhonchi b/l bases, but WOB improved. No new rales. Admit for sepsis due to CA pneumonia.  Final Clinical Impressions(s) / ED Diagnoses   Final diagnoses:  Sepsis, due to unspecified organism [facility])  Community acquired pneumonia, unspecified laterality    ED Discharge Orders    None       Duffy Bruce, MD 09/15/17 1104    Duffy Bruce, MD 09/15/17 1126    Duffy Bruce, MD 09/15/17 1128

## 2017-09-14 NOTE — ED Triage Notes (Signed)
Pt sent from [place] for SOB, low O2 saturation and rapid heart rate. Pt had cough for over week that is dry. reports SOB with exertion.

## 2017-09-14 NOTE — ED Notes (Signed)
Bed: WA10 Expected date:  Expected time:  Means of arrival:  Comments: Triage 1   

## 2017-09-15 ENCOUNTER — Inpatient Hospital Stay (HOSPITAL_COMMUNITY): Payer: Medicare Other

## 2017-09-15 DIAGNOSIS — I469 Cardiac arrest, cause unspecified: Secondary | ICD-10-CM

## 2017-09-15 DIAGNOSIS — A419 Sepsis, unspecified organism: Secondary | ICD-10-CM

## 2017-09-15 DIAGNOSIS — J189 Pneumonia, unspecified organism: Secondary | ICD-10-CM

## 2017-09-15 DIAGNOSIS — Z978 Presence of other specified devices: Secondary | ICD-10-CM

## 2017-09-15 DIAGNOSIS — I340 Nonrheumatic mitral (valve) insufficiency: Secondary | ICD-10-CM

## 2017-09-15 DIAGNOSIS — R0602 Shortness of breath: Secondary | ICD-10-CM

## 2017-09-15 DIAGNOSIS — I361 Nonrheumatic tricuspid (valve) insufficiency: Secondary | ICD-10-CM

## 2017-09-15 LAB — BLOOD GAS, ARTERIAL
ACID-BASE DEFICIT: 8.8 mmol/L — AB (ref 0.0–2.0)
ACID-BASE DEFICIT: 4.5 mmol/L — AB (ref 0.0–2.0)
Acid-base deficit: 5.3 mmol/L — ABNORMAL HIGH (ref 0.0–2.0)
Bicarbonate: 18.3 mmol/L — ABNORMAL LOW (ref 20.0–28.0)
Bicarbonate: 18.5 mmol/L — ABNORMAL LOW (ref 20.0–28.0)
Bicarbonate: 18.6 mmol/L — ABNORMAL LOW (ref 20.0–28.0)
DRAWN BY: 422461
DRAWN BY: 257881
Drawn by: 422461
FIO2: 100
FIO2: 100
FIO2: 65
MECHVT: 400 mL
O2 SAT: 78.3 %
O2 SAT: 94.3 %
O2 Saturation: 94.1 %
PATIENT TEMPERATURE: 36.8
PCO2 ART: 29.9 mmHg — AB (ref 32.0–48.0)
PEEP/CPAP: 10 cmH2O
PEEP: 12 cmH2O
PEEP: 12 cmH2O
PH ART: 7.237 — AB (ref 7.350–7.450)
PH ART: 7.41 (ref 7.350–7.450)
PO2 ART: 78.6 mmHg — AB (ref 83.0–108.0)
Patient temperature: 36.2
Patient temperature: 37
RATE: 26 resp/min
RATE: 30 resp/min
RATE: 28 resp/min
VT: 400 mL
VT: 400 mL
pCO2 arterial: 43.9 mmHg (ref 32.0–48.0)
pCO2 arterial: 32 mmHg (ref 32.0–48.0)
pH, Arterial: 7.378 (ref 7.350–7.450)
pO2, Arterial: 54.1 mmHg — ABNORMAL LOW (ref 83.0–108.0)
pO2, Arterial: 78.4 mmHg — ABNORMAL LOW (ref 83.0–108.0)

## 2017-09-15 LAB — COMPREHENSIVE METABOLIC PANEL
ALBUMIN: 2.7 g/dL — AB (ref 3.5–5.0)
ALK PHOS: 66 U/L (ref 38–126)
ALT: 35 U/L (ref 14–54)
AST: 46 U/L — AB (ref 15–41)
Anion gap: 13 (ref 5–15)
BILIRUBIN TOTAL: 1.8 mg/dL — AB (ref 0.3–1.2)
BUN: 18 mg/dL (ref 6–20)
CO2: 18 mmol/L — ABNORMAL LOW (ref 22–32)
Calcium: 7.8 mg/dL — ABNORMAL LOW (ref 8.9–10.3)
Chloride: 107 mmol/L (ref 101–111)
Creatinine, Ser: 1.02 mg/dL — ABNORMAL HIGH (ref 0.44–1.00)
GFR calc Af Amer: 56 mL/min — ABNORMAL LOW (ref >60)
GFR calc non Af Amer: 48 mL/min — ABNORMAL LOW (ref >60)
GLUCOSE: 204 mg/dL — AB (ref 65–99)
POTASSIUM: 4 mmol/L (ref 3.5–5.1)
Sodium: 138 mmol/L (ref 135–145)
TOTAL PROTEIN: 4.9 g/dL — AB (ref 6.5–8.1)

## 2017-09-15 LAB — RESPIRATORY PANEL BY PCR
Adenovirus: NOT DETECTED
BORDETELLA PERTUSSIS-RVPCR: NOT DETECTED
CORONAVIRUS 229E-RVPPCR: NOT DETECTED
Chlamydophila pneumoniae: NOT DETECTED
Coronavirus HKU1: NOT DETECTED
Coronavirus NL63: NOT DETECTED
Coronavirus OC43: NOT DETECTED
INFLUENZA B-RVPPCR: NOT DETECTED
Influenza A: NOT DETECTED
METAPNEUMOVIRUS-RVPPCR: NOT DETECTED
MYCOPLASMA PNEUMONIAE-RVPPCR: NOT DETECTED
Parainfluenza Virus 1: NOT DETECTED
Parainfluenza Virus 2: NOT DETECTED
Parainfluenza Virus 3: NOT DETECTED
Parainfluenza Virus 4: NOT DETECTED
RESPIRATORY SYNCYTIAL VIRUS-RVPPCR: NOT DETECTED
Rhinovirus / Enterovirus: NOT DETECTED

## 2017-09-15 LAB — GLUCOSE, CAPILLARY
GLUCOSE-CAPILLARY: 131 mg/dL — AB (ref 65–99)
GLUCOSE-CAPILLARY: 177 mg/dL — AB (ref 65–99)
GLUCOSE-CAPILLARY: 191 mg/dL — AB (ref 65–99)
Glucose-Capillary: 175 mg/dL — ABNORMAL HIGH (ref 65–99)
Glucose-Capillary: 161 mg/dL — ABNORMAL HIGH (ref 65–99)
Glucose-Capillary: 157 mg/dL — ABNORMAL HIGH (ref 65–99)
Glucose-Capillary: 128 mg/dL — ABNORMAL HIGH (ref 65–99)

## 2017-09-15 LAB — CBC
HEMATOCRIT: 42.7 % (ref 36.0–46.0)
HEMOGLOBIN: 13.4 g/dL (ref 12.0–15.0)
MCH: 29.7 pg (ref 26.0–34.0)
MCHC: 31.4 g/dL (ref 30.0–36.0)
MCV: 94.7 fL (ref 78.0–100.0)
Platelets: 160 10*3/uL (ref 150–400)
RBC: 4.51 MIL/uL (ref 3.87–5.11)
RDW: 14.4 % (ref 11.5–15.5)
WBC: 17.9 10*3/uL — AB (ref 4.0–10.5)

## 2017-09-15 LAB — HEMOGLOBIN A1C
Hgb A1c MFr Bld: 7 % — ABNORMAL HIGH (ref 4.8–5.6)
Mean Plasma Glucose: 154.2 mg/dL

## 2017-09-15 LAB — ECHOCARDIOGRAM COMPLETE
HEIGHTINCHES: 62 in
WEIGHTICAEL: 2663.16 [oz_av]

## 2017-09-15 LAB — TROPONIN I
TROPONIN I: 0.62 ng/mL — AB (ref <0.03)
TROPONIN I: 1.68 ng/mL — AB (ref <0.03)
TROPONIN I: 2.02 ng/mL — AB (ref <0.03)
TROPONIN I: 4.12 ng/mL — AB (ref <0.03)
Troponin I: 0.35 ng/mL (ref <0.03)
Troponin I: 0.57 ng/mL (ref <0.03)

## 2017-09-15 LAB — MRSA PCR SCREENING: MRSA by PCR: NEGATIVE

## 2017-09-15 LAB — LACTIC ACID, PLASMA
LACTIC ACID, VENOUS: 5.1 mmol/L — AB (ref 0.5–1.9)
LACTIC ACID, VENOUS: 3.3 mmol/L — AB (ref 0.5–1.9)
Lactic Acid, Venous: 1.7 mmol/L (ref 0.5–1.9)

## 2017-09-15 LAB — STREP PNEUMONIAE URINARY ANTIGEN: STREP PNEUMO URINARY ANTIGEN: POSITIVE — AB

## 2017-09-15 LAB — PROCALCITONIN: Procalcitonin: 2.11 ng/mL

## 2017-09-15 LAB — TSH: TSH: 2.333 u[IU]/mL (ref 0.350–4.500)

## 2017-09-15 LAB — CORTISOL: CORTISOL PLASMA: 46.2 ug/dL

## 2017-09-15 MED ORDER — FUROSEMIDE 10 MG/ML IJ SOLN
60.0000 mg | Freq: Once | INTRAMUSCULAR | Status: AC
  Administered 2017-09-15: 60 mg via INTRAVENOUS
  Filled 2017-09-15: qty 6

## 2017-09-15 MED ORDER — NOREPINEPHRINE 4 MG/250ML-% IV SOLN
0.0000 ug/min | INTRAVENOUS | Status: DC
Start: 2017-09-15 — End: 2017-09-18
  Administered 2017-09-15: 20 ug/min via INTRAVENOUS
  Administered 2017-09-15 (×2): 18 ug/min via INTRAVENOUS
  Administered 2017-09-16: 16 ug/min via INTRAVENOUS
  Administered 2017-09-16: 14 ug/min via INTRAVENOUS
  Administered 2017-09-16: 6 ug/min via INTRAVENOUS
  Administered 2017-09-17: 9 ug/min via INTRAVENOUS
  Filled 2017-09-15 (×8): qty 250

## 2017-09-15 MED ORDER — DOCUSATE SODIUM 50 MG/5ML PO LIQD
100.0000 mg | Freq: Two times a day (BID) | ORAL | Status: DC | PRN
  Filled 2017-09-15: qty 10

## 2017-09-15 MED ORDER — PANTOPRAZOLE SODIUM 40 MG IV SOLR: 40.0000 mg | Freq: Every day | INTRAVENOUS | Status: DC

## 2017-09-15 MED ORDER — MIDAZOLAM HCL 2 MG/2ML IJ SOLN: 1.0000 mg | INTRAMUSCULAR | Status: DC | PRN

## 2017-09-15 MED ORDER — NOREPINEPHRINE BITARTRATE 1 MG/ML IV SOLN
0.0000 ug/min | INTRAVENOUS | Status: DC
  Administered 2017-09-15: 18 ug/min via INTRAVENOUS
  Administered 2017-09-15: 2 ug/min via INTRAVENOUS
  Administered 2017-09-15: 20 ug/min via INTRAVENOUS
  Filled 2017-09-15 (×4): qty 4

## 2017-09-15 MED ORDER — LEVOFLOXACIN IN D5W 750 MG/150ML IV SOLN: 750.0000 mg | INTRAVENOUS | Status: DC

## 2017-09-15 MED ORDER — FENTANYL CITRATE (PF) 100 MCG/2ML IJ SOLN
25.0000 ug | INTRAMUSCULAR | Status: DC | PRN
  Administered 2017-09-15: 100 ug via INTRAVENOUS
  Administered 2017-09-15 (×2): 50 ug via INTRAVENOUS
  Administered 2017-09-15: 75 ug via INTRAVENOUS
  Administered 2017-09-15 (×2): 100 ug via INTRAVENOUS
  Administered 2017-09-16 (×2): 75 ug via INTRAVENOUS
  Administered 2017-09-16 (×2): 50 ug via INTRAVENOUS
  Administered 2017-09-17: 75 ug via INTRAVENOUS
  Administered 2017-09-17: 100 ug via INTRAVENOUS
  Filled 2017-09-15 (×4): qty 2

## 2017-09-15 MED ORDER — PANTOPRAZOLE SODIUM 40 MG IV SOLR
40.0000 mg | Freq: Two times a day (BID) | INTRAVENOUS | Status: DC
  Administered 2017-09-15 – 2017-09-16 (×4): 40 mg via INTRAVENOUS
  Filled 2017-09-15 (×4): qty 40

## 2017-09-15 MED ORDER — ENOXAPARIN SODIUM 40 MG/0.4ML ~~LOC~~ SOLN
40.0000 mg | SUBCUTANEOUS | Status: DC
  Administered 2017-09-15: 40 mg via SUBCUTANEOUS
  Filled 2017-09-15: qty 0.4

## 2017-09-15 MED ORDER — CHLORHEXIDINE GLUCONATE 0.12% ORAL RINSE (MEDLINE KIT)
15.0000 mL | Freq: Two times a day (BID) | OROMUCOSAL | Status: DC
  Administered 2017-09-15 – 2017-09-19 (×9): 15 mL via OROMUCOSAL

## 2017-09-15 MED ORDER — INSULIN ASPART 100 UNIT/ML ~~LOC~~ SOLN
2.0000 [IU] | SUBCUTANEOUS | Status: DC
  Administered 2017-09-15: 4 [IU] via SUBCUTANEOUS
  Administered 2017-09-15: 2 [IU] via SUBCUTANEOUS
  Administered 2017-09-15 (×2): 4 [IU] via SUBCUTANEOUS

## 2017-09-15 MED ORDER — MIDAZOLAM HCL 2 MG/2ML IJ SOLN
1.0000 mg | INTRAMUSCULAR | Status: DC | PRN
  Administered 2017-09-15 – 2017-09-16 (×3): 1 mg via INTRAVENOUS
  Filled 2017-09-15 (×3): qty 2

## 2017-09-15 MED ORDER — SODIUM CHLORIDE 0.9 % IV SOLN
INTRAVENOUS | Status: DC | PRN
  Administered 2017-09-15: 04:00:00 via INTRA_ARTERIAL

## 2017-09-15 MED ORDER — FENTANYL CITRATE (PF) 100 MCG/2ML IJ SOLN: 50.0000 ug | INTRAMUSCULAR | Status: DC | PRN

## 2017-09-15 MED ORDER — FENTANYL 2500MCG IN NS 250ML (10MCG/ML) PREMIX INFUSION
0.0000 ug/h | INTRAVENOUS | Status: DC
  Administered 2017-09-15: 75 ug/h via INTRAVENOUS
  Administered 2017-09-16: 125 ug/h via INTRAVENOUS
  Filled 2017-09-15 (×2): qty 250

## 2017-09-15 MED ORDER — ORAL CARE MOUTH RINSE
15.0000 mL | Freq: Four times a day (QID) | OROMUCOSAL | Status: DC
  Administered 2017-09-15 – 2017-09-20 (×19): 15 mL via OROMUCOSAL

## 2017-09-15 NOTE — Progress Notes (Signed)
Patient noted in bed with both leg hanging off the bed, Oxygen off her nose.While attempted to reposition patient, noted that patient was biting her tongue and her lips looks bluish, She was also clammy. Patient was still breathing at this time. Charge nurse called to room, Blood sugar check, Vs checked, RR nurse notified. While we are still assessing patient, patient coded, CPR started.

## 2017-09-15 NOTE — Progress Notes (Addendum)
CODE BLUE note:  After transfer to ICU, patient was noted to lose pulses again.  On arrival, CPR was in progress.  Patient received several more doses of epinephrine.  The ET tube was pulled back a total of 3 cm and bilateral breath sounds were now heard.  She had return of palpable pulses.  I have spoken with Dr. Oletta Darter of critical care who states patient will be seen by intensivist.  Of note, portable chest x-ray is still pending.  Chest x-ray confirms reposition tube 1.7 cm above the carina.  Cardiopulmonary Resuscitation (CPR) Procedure Note Directed/Performed by: Delora Fuel I personally directed ancillary staff and/or performed CPR in an effort to regain return of spontaneous circulation and to maintain cardiac, neuro and systemic perfusion.

## 2017-09-15 NOTE — Progress Notes (Signed)
  Echocardiogram 2D Echocardiogram has been performed.  Darlina Sicilian M 09/15/2017, 8:33 AM

## 2017-09-15 NOTE — Progress Notes (Signed)
[facility] Progress Note Patient Name: Sandra [name] DOB: 09/09/1928 MRN: [id number]   Date of Service  09/15/2017  HPI/Events of Note  ABG on 100%/PRVC 26/TV 400/P 10 = 7.23/43.9/54.1/18.3  eICU Interventions  Will order: 1. Fentanyl 50-100 mcg IV Q 2 hours PRN. 2. Increase PEEP to 12. 3. Increase PRVC rate to 30. 4. Repeat ABG at 5 AM.     Intervention Category Major Interventions: Hypoxemia - evaluation and management;Respiratory failure - evaluation and management  Sommer,Steven Eugene 09/15/2017, 2:42 AM

## 2017-09-15 NOTE — Progress Notes (Signed)
Unable to reach  daughter on her home and cell phone at this time, voice mail left for her to call back. Will cont to f/u.

## 2017-09-15 NOTE — Progress Notes (Addendum)
Initial Nutrition Assessment  DOCUMENTATION CODES:   Obesity unspecified  INTERVENTION:   Monitor for diet advancement/toleration  If tube feeding desired: Vital High Protein @ 40 ml/hr 30 ml Prostat BID Provides: 1160 kcals, 114 grams protein, 806 ml free water. Meets 100% of calorie and protein needs.   NUTRITION DIAGNOSIS:   Inadequate oral intake related to inability to eat as evidenced by NPO status.  GOAL:   Provide needs based on ASPEN/SCCM guidelines  MONITOR:   Diet advancement, Vent status, TF tolerance, I & O's, Labs, Weight trends  REASON FOR ASSESSMENT:   Ventilator    ASSESSMENT:   Patient with PMH significant for HTN, HLD, osteoporosis, and vitamin D deficiency. Presents this admission with shortness of breath and confusion. Initially pt was admitted for CAP but became hypoxic and had PEA arrest in the ED. Pt was intubated and transferred to ICU.   Pressors are now discontinued. Foley in place in attempts to diuresis pt. Pt does not have OGT /NGT in place. No family at bedside to provide history. Records are limited in weight history over the last year. Pt shows to have weighed 151 lb 03/2017 and 166 lb this admission. Do not suspect recent wt loss. Will try to obtain more information from family if possible. Nutrition-Focused physical exam completed.   I/O: +1545 ml since admit UOP: 15 ml this morning  Patient is currently intubated on ventilator support MV: 11.2 L/min Temp (24hrs), Avg:98.9 F (37.2 C), Min:92.8 F (33.8 C), Max:100.9 F (38.3 C) BP: 103/53 MAP: 70 Propofol: None  Medications reviewed and include: Vit D, SSI, levophed (weaning off) Labs reviewed: CBG 124-222   NUTRITION - FOCUSED PHYSICAL EXAM:    Most Recent Value  Orbital Region  No depletion  Upper Arm Region  No depletion  Thoracic and Lumbar Region  Unable to assess  Buccal Region  No depletion  Temple Region  No depletion  Clavicle Bone Region  No depletion  Clavicle  and Acromion Bone Region  No depletion  Scapular Bone Region  Unable to assess  Dorsal Hand  No depletion  Patellar Region  No depletion  Anterior Thigh Region  No depletion  Posterior Calf Region  No depletion  Edema (RD Assessment)  Unable to assess     Diet Order:  Diet NPO time specified  EDUCATION NEEDS:   Not appropriate for education at this time  Skin:  Skin Assessment: Reviewed RN Assessment  Last BM:  PTA  Height:   Ht Readings from Last 1 Encounters:  09/29/2017 5\' 2"  (1.575 m)    Weight:   Wt Readings from Last 1 Encounters:  09/15/17 166 lb 7.2 oz (75.5 kg)    Ideal Body Weight:  50 kg  BMI:  Body mass index is 30.44 kg/m.  Estimated Nutritional Needs:   Kcal:  1000-1200 (13-17 kcal/kg)  Protein:  > 100 g/day  Fluid:  MD to determine    [place], LDN Clinical Nutrition Pager # - [phone]

## 2017-09-15 NOTE — Progress Notes (Signed)
RT and NP  Tried to decrease the Pt's FIO2 and Peep and her SATS dropped nurse called and stated that she placed the Pt on 100% and 12 of and the nurse told the NP. RT will continue to monitor

## 2017-09-15 NOTE — Progress Notes (Signed)
CODE BLUE note:  Patient admitted with community-acquired pneumonia and pleural effusion, noted to be getting hypoxic and then suddenly collapsed and cardiac arrest.  CPR initiated and CODE BLUE called.  On arrival, CPR was in progress.  Rhythm was pulseless electrical activity.  Several rounds of epinephrine were given with ongoing pulseless electrical activity.  Patient was intubated and CPR continued.  Continued to be in pulseless electrical activity.  Given a dose of sodium bicarbonate with return of spontaneous circulation.  Noted to be somewhat hypotensive and started on epinephrine drip.  Repeat assessment showed decreased breath sounds on the left.  Decision to adjust to pull await chest x-ray.  Patient transferred to ICU.  Critical care has been consulted (Dr. Janann Colonel).  Portable chest x-ray has been ordered to assess tube placement.  ECG Interpretation: OMAR, ORREGO YT:[id number] 15-Sep-2017 01:07:57 [facility][id number] ROUTINE RECORD Sinus tachycardia with Premature atrial complexes Rightward axis ST & T wave abnormality, consider lateral ischemia Abnormal ECG When compared with ECG of 09/19/2017, ST-t abnormality is more pronounced Confirmed by Delora Fuel ([id number]) on 09/15/2017 1:12:16 AM 64m/s 11mmV _0  9.0.4 12SL 241 HD CID: 10 Confirmed By: DaDelora Fuelent. rate 121 BPM PR interval 130 ms QRS duration 100 ms QT/QTc 358/508 ms P-R-T axes 9 96 146 01-Jun-1929 (88 yr) Female Caucasian Room:1421 Loc:504 Technician: [id number] ind:  Cardiopulmonary Resuscitation (CPR) Procedure Note Directed/Performed by: DaDelora Fuel personally directed ancillary staff and/or performed CPR in an effort to regain return of spontaneous circulation and to maintain cardiac, neuro and systemic perfusion.   Procedure Name: Intubation Date/Time: 09/15/2017 1:00 AM Performed by: GlDelora FuelMD Pre-anesthesia Checklist: Patient identified, Emergency Drugs available, Patient being  monitored and Timeout performed Oxygen Delivery Method: Ambu bag Preoxygenation: Pre-oxygenation with 100% oxygen Ventilation: Mask ventilation without difficulty Laryngoscope Size: Mac and 3 Grade View: Grade I Number of attempts: 2 Airway Equipment and Method: Stylet Placement Confirmation: ETT inserted through vocal cords under direct vision,  Breath sounds checked- equal and bilateral and Positive ETCO2 Secured at: 25 cm Tube secured with: Tape Dental Injury: Teeth and Oropharynx as per pre-operative assessment

## 2017-09-15 NOTE — Consult Note (Signed)
.. ..  Name: Sandra [name] MRN: [id number] DOB: 01/08/1929    ADMISSION DATE:  09/30/2017 CONSULTATION DATE:  09/15/17  REFERRING MD :  Marquette Old MD- EDP  CHIEF COMPLAINT:  S/p cardiac arrest  BRIEF PATIENT DESCRIPTION: 84 yr old female with PMHx of HTN, Osteoporosis, Vit D deficiency that presented on 09/19/2017 to [facility] with complaints of dyspnea found to have b/l effusions on CXR and elevated BNP and was admitted for CAP and CHF. While pt was on floor she has a PEA arrest approx around 1 AM and was intubated  SIGNIFICANT EVENTS  Cardiac arrest 09/16/1998  STUDIES:  09/15/2017 2D echo>> 09/15/2017 pro-calcitonin>>> 09/15/2017 cortisol>> 09/15/2017 venous duplex lower extremities>>  HISTORY OF PRESENT ILLNESS:  85 yr old female with PMHx of Anxiety, HTN, HLD, Osteoporosis, vit D Deficiency presented from home with increased work of breathing and confusion. When pt was awake and verbal on presentation she was able to state that it started 2 weeks ago as a dry non productive cough, denied fever, chills, myalgias she was noted to have lower extremity edema. BNP 2708.6 Received one Liter of IVF, started on empiric antibiotics Levaquin IV per Pharm dosing D dimer was sent: 0.81-> no CTA done She appeared to be improving on re-evaluation and was admitted for sepsis due to CAP by Hospitalist.   Per EDP note at 1:21 AM Pt became hypoxic and collapsed-> PEA arrest received several rounds of epi, was intubated, received bicarb and had ROSC, Pt was transferred to ICU. PCCM assumed care.    SUBJECTIVE:  Currently sedated on full mechanical ventilatory support.  Issues with oxygenation.  She is also requiring increased pressor support.  We will check pro-calcitonin for completeness  VITAL SIGNS: Temp:  [92.8 F (33.8 C)-100.9 F (38.3 C)] 99.9 F (37.7 C) (03/12 0930) Pulse Rate:  [79-126] 80 (03/12 0930) Resp:  [20-30] 30 (03/12 0930) BP: (96-180)/(43-120) 101/67 (03/12 0800) SpO2:  [84 %-100 %]  92 % (03/12 0930) Arterial Line BP: (84-123)/(51-76) 88/54 (03/12 0930) FiO2 (%):  [100 %] 100 % (03/12 0445) Weight:  [63.5 kg (140 lb)-75.5 kg (166 lb 7.2 oz)] 75.5 kg (166 lb 7.2 oz) (03/12 0100)  PHYSICAL EXAMINATION: General: Frail elderly female who is sedated on the vent HEENT: Endotracheal tube to ventilator, orogastric tube to suction PSY: Not responsive Neuro: Either sedated or agitated CV: Heart sounds are regular regular rate and rhythm PULM: even/non-labored, lungs bilaterally coarse rhonchi PO:EUMP, non-tender, bsx4 active  Extremities: warm/dry, 1 edema  Skin: no rashes or lesions   Recent Labs  Lab 09/07/2017 1559 09/15/17 0115  NA 137 138  K 3.8 4.0  CL 105 107  CO2 20* 18*  BUN 19 18  CREATININE 0.88 1.02*  GLUCOSE 222* 204*   Recent Labs  Lab 09/27/2017 1559 09/15/17 0115  HGB 14.2 13.4  HCT 43.4 42.7  WBC 17.1* 17.9*  PLT 242 160   Dg Chest 2 View  Result Date: 09/21/2017 CLINICAL DATA:  Shortness of breath with poor oxygen saturation and tachycardia. EXAM: CHEST - 2 VIEW COMPARISON:  11/13/2015 FINDINGS: The heart is enlarged. There is aortic atherosclerosis. There are bilateral pleural effusions with dependent atelectasis. There is patchy density in both lower lobes. Upper lobes are clear. The findings are more consistent with pneumonia, but there could also be an element of congestive heart failure. IMPRESSION: Bilateral effusions, patchy infiltrate/atelectasis in both lower lungs. Findings more consistent with pneumonia, but there could be an element of congestive heart failure. Electronically  Signed   By: Nelson Chimes M.D.   On: 09/15/2017 16:34   Dg Abd 1 View  Result Date: 09/15/2017 CLINICAL DATA:  Enteric tube placement. EXAM: ABDOMEN - 1 VIEW COMPARISON:  CT of the abdomen and pelvis performed 03/22/2015 FINDINGS: The patient's enteric tube is noted ending overlying the fundus of the stomach, with the side port about the gastroesophageal junction.  There is distention of small-bowel loops to 3.6 cm in diameter. There is suggestion of air within the cecum and at the splenic flexure of the colon. This may reflect ileus. No free intra-abdominal air is seen, though evaluation for free air is limited on a single supine view. No acute osseous abnormalities are identified. Mild degenerative change is noted at the mid lumbar spine. IMPRESSION: 1. Enteric tube noted ending overlying the fundus of the stomach, with the side port about the gastroesophageal junction. 2. Distention of small-bowel loops to 3.6 cm in diameter. This may reflect ileus, given air within the colon. Electronically Signed   By: Garald Balding M.D.   On: 09/15/2017 02:34   Dg Chest Port 1 View  Result Date: 09/15/2017 CLINICAL DATA:  84 y/o  F; post intubation. EXAM: PORTABLE CHEST 1 VIEW COMPARISON:  09/22/2017 chest radiograph. FINDINGS: Stable cardiomegaly given projection and technique. Aortic atherosclerosis with calcification. Endotracheal tube tip projects 1.7 cm above carina. Enteric tube tip extends below field of view and abdomen. Transcutaneous pacing pads noted. Interval increase in patchy opacities throughout the lungs probably representing alveolar pulmonary edema. Pneumonia is possible. Small effusions. Bones are unremarkable. IMPRESSION: 1. Endotracheal tube tip projects 1.7 cm above carina. Enteric tube tip below field of view and abdomen. 2. Increased diffuse patchy opacities probably representing alveolar pulmonary edema. Pneumonia is possible. Small effusions. Electronically Signed   By: Kristine Garbe M.D.   On: 09/15/2017 02:22    ASSESSMENT / PLAN: NEURO: Altered Mental Status/ Encephalopathy S/p cardiac arrest GCS 6 Withdraws from painful stimuli not much else ICU protocol for sedation/pain mgmt - intermittent fentanyl and versed  RASS goal 0 Continue Neurochecks Stable CT of the head  CARDIAC:  S/p cardiac arrest MAP goal >65 mmHg Initially  on Levophed post code -> weaned off .Marland KitchenCardiac Panel (last 3 results) Recent Labs    09/15/17 0115 09/15/17 0334 09/15/17 0900  TROPONINI 0.57* 0.62* 1.68*   Noted to have ST/T wave abnormalities on EKG Troponin slight trend up--> continue to trend Hold home antihypertensives Has a h/o being on Plavix HFpEF 55-60% EF with Grade 2 diastolic dysfn elevated BNP 708.6 ECHO pending Last ECHO in 2016  PULMONARY: Acute hypoxic respiratory failure Secondary to Cardiogenic Pulmonary edema and suspected Community Accquired Pneumonia Influenza negative on swab Currently intubated requiring PEEP 12 and FiO2 65% Reviewed latest CXR BNP was elevated Given diuretic early a.m. 312 subsequent drug decrease in blood pressure If oxygenation does not improve post diuresis consider: CTA chest: D dimer was collected earlier 0.81- low suspicion for PE   RVP panel pending F/u Step and Legionella Ur Ag Continue on droplet precautions Based on cx will adjust abx   ID: Community Acquired Pneumonia- suspected F/u blood and resp cultures Continues on droplet precaution trend WBC, fever curve, and lactate  Endocrine: Checked TSH levels--. 2.33 Has a h/o Vit D deficiency Started on Phase 1 ICU glycemic protocol to keep BS ,180mg /dl  GI: Keep NPO OGT in place with small sanguinous output Start on PPI Q 12h No TF for now   Heme: Recent Labs  10/03/2017 1559 09/15/17 0115  HGB 14.2 13.4    DVT prophylaxis  RENAL Lab Results  Component Value Date   CREATININE 1.02 (H) 09/15/2017   CREATININE 0.88 09/26/2017   CREATININE 0.98 03/25/2017   Recent Labs  Lab 09/17/2017 1559 09/15/17 0115  K 3.8 4.0    Acute Kidney Injury Most likely secondary to hypoperfusion S/p cardiac arrest Metabolic Acidosis AG 13 F/u Lactic 5.1-3.3 Was on NS at 100 BNP 2700-> decrease IVF Indwelling foley cath in place     DISPOSITION: ICU CODE STATUS: Full PROGNOSIS: POOR FAMILY: Family was  reached by phone on 09/15/2017, they reportedly coming in to see patient.   App cct 45 min   Richardson Landry Minor ACNP Maryanna Shape PCCM Pager [phone] till 1 pm If no answer page [phone] 09/15/2017, 10:06 AM

## 2017-09-15 NOTE — Consult Note (Addendum)
.. ..  Name: Sandra [name] MRN: [id number] DOB: 02-27-1929    ADMISSION DATE:  09/07/2017 CONSULTATION DATE:  09/15/17  REFERRING MD :  Marquette Old MD- EDP  CHIEF COMPLAINT:  S/p cardiac arrest  BRIEF PATIENT DESCRIPTION: 84 yr old female with PMHx of HTN, Osteoporosis, Vit D deficiency that presented on 09/16/2017 to [facility] with complaints of dyspnea found to have b/l effusions on CXR and elevated BNP and was admitted for CAP and CHF. While pt was on floor she has a PEA arrest approx around 1 AM and was intubated  SIGNIFICANT EVENTS  Cardiac arrest  STUDIES:  CXR/ DG ABD ABG   HISTORY OF PRESENT ILLNESS:  84 yr old female with PMHx of Anxiety, HTN, HLD, Osteoporosis, vit D Deficiency presented from home with increased work of breathing and confusion. When pt was awake and verbal on presentation she was able to state that it started 2 weeks ago as a dry non productive cough, denied fever, chills, myalgias she was noted to have lower extremity edema. BNP 2708.6 Received one Liter of IVF, started on empiric antibiotics Levaquin IV per Pharm dosing D dimer was sent: 0.81-> no CTA done She appeared to be improving on re-evaluation and was admitted for sepsis due to CAP by Hospitalist.   Per EDP note at 1:21 AM Pt became hypoxic and collapsed-> PEA arrest received several rounds of epi, was intubated, received bicarb and had ROSC, Pt was transferred to ICU. PCCM assumed care.  PAST MEDICAL HISTORY :   has a past medical history of Anxiety, Colon polyps, HTN (hypertension), Hyperlipidemia, LBP (low back pain) (2011), Osteoporosis, Peripheral neuropathy, Strabismus, Vitamin B12 deficiency, and Vitamin D deficiency.  has a past surgical history that includes Abdominal hysterectomy. Prior to Admission medications   Medication Sig Start Date End Date Taking? Authorizing Provider  acetaminophen (TYLENOL) 500 MG tablet Take 500 mg by mouth every 6 (six) hours as needed for moderate pain.   Yes [provider]  amLODipine (NORVASC) 10 MG tablet Take 0.5 tablets (5 mg total) by mouth daily. 03/25/17  Yes Plotnikov, Evie Lacks, MD  Ascorbic Acid (VITAMIN C) 100 MG tablet Take 100 mg by mouth daily.   Yes [provider]  atenolol (TENORMIN) 100 MG tablet Take 1 tablet (100 mg total) by mouth 2 (two) times daily. Annual appt is due must see provider for future refills 03/25/17  Yes Plotnikov, Evie Lacks, MD  Cholecalciferol (VITAMIN D3) 1000 UNITS tablet Take 1,000 Units by mouth daily.     Yes [provider]  cloNIDine (CATAPRES) 0.1 MG tablet TAKE ONE TABLET BY MOUTH TWICE DAILY FOR sweating & blood pressure 03/25/17  Yes Plotnikov, Evie Lacks, MD  clopidogrel (PLAVIX) 75 MG tablet Take 1 tablet (75 mg total) by mouth daily. 03/25/17  Yes Plotnikov, Evie Lacks, MD  diclofenac (VOLTAREN) 50 MG EC tablet Take 1 tablet (50 mg total) by mouth 2 (two) times daily as needed for moderate pain. 08/27/15  Yes Plotnikov, Evie Lacks, MD  diclofenac sodium (VOLTAREN) 1 % GEL Apply 1 application topically 4 (four) times daily. 02/15/16  Yes Plotnikov, Evie Lacks, MD  Doxepin HCl (SILENOR) 3 MG TABS Take 1 tablet (3 mg total) by mouth at bedtime. Annual appt is due must see provider for future refills 03/25/17  Yes Plotnikov, Evie Lacks, MD  SILENOR 3 MG TABS TAKE ONE TABLET BY MOUTH DAILY AT BEDTIME 06/22/17  Yes Plotnikov, Evie Lacks, MD  traMADol (ULTRAM) 50 MG tablet Take  1-2 tablets (50-100 mg total) by mouth 2 (two) times daily as needed for moderate pain. 08/27/15  Yes Plotnikov, Evie Lacks, MD  traZODone (DESYREL) 150 MG tablet Take 1 tablet (150 mg total) by mouth at bedtime. 03/25/17  Yes Plotnikov, Evie Lacks, MD  triamcinolone ointment (KENALOG) 0.5 % APPLY TOPICALLY TWICE DAILY 07/06/15  Yes Plotnikov, Evie Lacks, MD  vitamin B-12 (CYANOCOBALAMIN) 1000 MCG tablet Take 1,000 mcg by mouth daily.     Yes [provider]    FAMILY HISTORY:  family history includes Diabetes in her sister;  Early death in her father; Heart disease in her sister; Heart disease (age of onset: 13) in her father; Heart disease (age of onset: 6) in her mother; Hypertension in her unknown relative. SOCIAL HISTORY:  reports that  has never smoked. she has never used smokeless tobacco. She reports that she does not drink alcohol or use drugs.  REVIEW OF SYSTEMS:  Unable to obtain due to encephalopathy s/p cardiac arrest Constitutional: Negative for fever, chills, weight loss, malaise/fatigue and diaphoresis.  HENT: Negative for hearing loss, ear pain, nosebleeds, congestion, sore throat, neck pain, tinnitus and ear discharge.   Eyes: Negative for blurred vision, double vision, photophobia, pain, discharge and redness.  Respiratory: Negative for cough, hemoptysis, sputum production, shortness of breath, wheezing and stridor.   Cardiovascular: Negative for chest pain, palpitations, orthopnea, claudication, leg swelling and PND.  Gastrointestinal: Negative for heartburn, nausea, vomiting, abdominal pain, diarrhea, constipation, blood in stool and melena.  Genitourinary: Negative for dysuria, urgency, frequency, hematuria and flank pain.  Musculoskeletal: Negative for myalgias, back pain, joint pain and falls.  Skin: Negative for itching and rash.  Neurological: Negative for dizziness, tingling, tremors, sensory change, speech change, focal weakness, seizures, loss of consciousness, weakness and headaches.  Endo/Heme/Allergies: Negative for environmental allergies and polydipsia. Does not bruise/bleed easily.  SUBJECTIVE:   VITAL SIGNS: Temp:  [92.8 F (33.8 C)-100.9 F (38.3 C)] 98.4 F (36.9 C) (03/12 0235) Pulse Rate:  [90-126] 90 (03/12 0235) Resp:  [20-30] 28 (03/12 0235) BP: (130-180)/(66-112) 141/66 (03/12 0200) SpO2:  [87 %-98 %] 89 % (03/12 0244) FiO2 (%):  [100 %] 100 % (03/12 0244) Weight:  [63.5 kg (140 lb)-75.5 kg (166 lb 7.2 oz)] 75.5 kg (166 lb 7.2 oz) (03/12 0100)  PHYSICAL  EXAMINATION: General:  Not responsive, intubated Neuro:  Withdraws to deep painful stimuli, no other pertinent positives. Pinpoint pupils noted HEENT:  Normocephalic ETT in oropharynx, OGT in oropharynx Cardiovascular:  S1 and S2 appreciated, no appreciable rubs or gallops Lungs:  Coarse breath sounds bilaterally + crackles at bases Abdomen:  Distended but soft, hypoactive BS Musculoskeletal:  No gross deformities. +2 edema in LE Skin:  Grossly intact  Recent Labs  Lab 09/13/2017 1559 09/15/17 0115  NA 137 138  K 3.8 4.0  CL 105 107  CO2 20* 18*  BUN 19 18  CREATININE 0.88 1.02*  GLUCOSE 222* 204*   Recent Labs  Lab 09/07/2017 1559 09/15/17 0115  HGB 14.2 13.4  HCT 43.4 42.7  WBC 17.1* 17.9*  PLT 242 160   Dg Chest 2 View  Result Date: 09/08/2017 CLINICAL DATA:  Shortness of breath with poor oxygen saturation and tachycardia. EXAM: CHEST - 2 VIEW COMPARISON:  11/13/2015 FINDINGS: The heart is enlarged. There is aortic atherosclerosis. There are bilateral pleural effusions with dependent atelectasis. There is patchy density in both lower lobes. Upper lobes are clear. The findings are more consistent with pneumonia, but there could also  be an element of congestive heart failure. IMPRESSION: Bilateral effusions, patchy infiltrate/atelectasis in both lower lungs. Findings more consistent with pneumonia, but there could be an element of congestive heart failure. Electronically Signed   By: Nelson Chimes M.D.   On: 09/24/2017 16:34   Dg Abd 1 View  Result Date: 09/15/2017 CLINICAL DATA:  Enteric tube placement. EXAM: ABDOMEN - 1 VIEW COMPARISON:  CT of the abdomen and pelvis performed 03/22/2015 FINDINGS: The patient's enteric tube is noted ending overlying the fundus of the stomach, with the side port about the gastroesophageal junction. There is distention of small-bowel loops to 3.6 cm in diameter. There is suggestion of air within the cecum and at the splenic flexure of the colon. This  may reflect ileus. No free intra-abdominal air is seen, though evaluation for free air is limited on a single supine view. No acute osseous abnormalities are identified. Mild degenerative change is noted at the mid lumbar spine. IMPRESSION: 1. Enteric tube noted ending overlying the fundus of the stomach, with the side port about the gastroesophageal junction. 2. Distention of small-bowel loops to 3.6 cm in diameter. This may reflect ileus, given air within the colon. Electronically Signed   By: Garald Balding M.D.   On: 09/15/2017 02:34   Dg Chest Port 1 View  Result Date: 09/15/2017 CLINICAL DATA:  84 y/o  F; post intubation. EXAM: PORTABLE CHEST 1 VIEW COMPARISON:  09/16/2017 chest radiograph. FINDINGS: Stable cardiomegaly given projection and technique. Aortic atherosclerosis with calcification. Endotracheal tube tip projects 1.7 cm above carina. Enteric tube tip extends below field of view and abdomen. Transcutaneous pacing pads noted. Interval increase in patchy opacities throughout the lungs probably representing alveolar pulmonary edema. Pneumonia is possible. Small effusions. Bones are unremarkable. IMPRESSION: 1. Endotracheal tube tip projects 1.7 cm above carina. Enteric tube tip below field of view and abdomen. 2. Increased diffuse patchy opacities probably representing alveolar pulmonary edema. Pneumonia is possible. Small effusions. Electronically Signed   By: Kristine Garbe M.D.   On: 09/15/2017 02:22    ASSESSMENT / PLAN: NEURO: Altered Mental Status/ Encephalopathy S/p cardiac arrest GCS 6 Withdraws from painful stimuli not much else ICU protocol for sedation/pain mgmt - intermittent fentanyl and versed  RASS goal 0 Continue Neurochecks  CARDIAC:  S/p cardiac arrest MAP goal >65 mmHg Initially on Levophed post code -> weaned off .Marland KitchenCardiac Panel (last 3 results) Recent Labs    09/10/2017 1713 09/15/2017 2326 09/15/17 0115  TROPONINI 0.10* 0.35* 0.57*   Noted to  have ST/T wave abnormalities on EKG Troponin slight trend up--> continue to trend Hold home antihypertensives Has a h/o being on Plavix HFpEF 55-60% EF with Grade 2 diastolic dysfn elevated BNP 708.6 Now off of pressors, insert foley cath and attempt diuresis ECHO pending Last ECHO in 2016  PULMONARY: Acute hypoxic respiratory failure Secondary to Cardiogenic Pulmonary edema and suspected Community Accquired Pneumonia Influenza negative on swab Currently intubated requiring PEEP 12 Reviewed latest CXR BNP was elevated Now off of pressors will attempt diuresis If oxygenation does not improve post diuresis consider: CTA chest: D dimer was collected earlier 0.81- low suspicion for PE  Insert A-line for frequent ABG checks RVP panel pending F/u Step and Legionella Ur Ag Continue on droplet precautions Based on cx will adjust abx   ID: Community Acquired Pneumonia- suspected F/u blood and resp cultures Continues on droplet precaution trend WBC, fever curve, and lactate  Endocrine: Checked TSH levels--. 2.33 Has a h/o Vit D deficiency Started  on Phase 1 ICU glycemic protocol to keep BS ,180mg /dl  GI: Keep NPO OGT in place with small sanguinous output Start on PPI Q 12h No TF for now   Heme: CBC at 115 AM Hgb 13.4, Plts 160 Hgb<7 transfuse PRBCs Pt has a h/o being on Plavix DVT PPx-> if not why is it not given  RENAL Acute Kidney Injury - secondary to hypoperfusion S/p cardiac arrest Metabolic Acidosis AG 13 F/u Lactic ( 1.92 prior to event) Was on NS at 100 BNP 2700-> decrease IVF Indwelling foley cath in place Will attempt diuresis now that pt is off vasopressors Baseline Cr .. Lab Results  Component Value Date   CREATININE 1.02 (H) 09/15/2017   CREATININE 0.88 10/03/2017   CREATININE 0.98 03/25/2017  .Marland Kitchen Patient Vitals for the past 24 hrs:  Urine Occurrence  09/10/2017 2211 1     I, Dr Seward Carol have personally reviewed patient's available  data, including medical history, events of note, physical examination and test results as part of my evaluation. I have discussed with NP and other care providers such as pharmacist, RN and RRT. The patient is critically ill with multiple organ systems failure and requires high complexity decision making for assessment and support, frequent evaluation and titration of therapies, application of advanced monitoring technologies and extensive interpretation of multiple databases. Critical Care Time devoted to patient care services described in this note is 52 Minutes. This time reflects time of care of this signee Dr Seward Carol. This critical care time does not reflect procedure time, or teaching time or supervisory time of NP Hoffmann but could involve care discussion time    DISPOSITION: ICU CC TIME: 55 mins CODE STATUS: Full PROGNOSIS: POOR FAMILY: not at bedside, attempted to reach by phone ( called both home and cell phone for daughter Maurene Capes with no success)   Signed Dr Seward Carol Pulmonary Critical Care [facility] [place] Pager: [phone]  09/15/2017, 3:00 AM

## 2017-09-15 NOTE — Progress Notes (Signed)
Pt desaturated while on 100% FiO2.  PEEP increased to 10 cm H2O for SpO2 and rate increased to 26 per MD orders.

## 2017-09-15 NOTE — Progress Notes (Signed)
PHARMACY NOTE:  ANTIMICROBIAL RENAL DOSAGE ADJUSTMENT  Current antimicrobial regimen includes a mismatch between antimicrobial dosage and estimated renal function.  As per policy approved by the Pharmacy & Therapeutics and Medical Executive Committees, the antimicrobial dosage will be adjusted accordingly.  Current antimicrobial dosage:  Levaquin 750mg  IV q24h x 5 days  Indication: CAP  Renal Function:  Estimated Creatinine Clearance: 37.7 mL/min (A) (by C-G formula based on SCr of 1.02 mg/dL (H)). []      On intermittent HD, scheduled: []      On CRRT    Antimicrobial dosage has been changed to:  750mg  IV q48h x 2 doses (to complete 5 days)  Additional comments:   Thank you for allowing pharmacy to be a part of this patient's care.  Peggyann Juba, PharmD, BCPS Pager: [phone] 09/15/2017 6:57 AM

## 2017-09-15 NOTE — Progress Notes (Signed)
Patient was admitted under hospitalist service, then PEA arrest, intubated overnight, critically ill on pressure support, under the care of intensivist. hospitalist will sign off Donat Humble N

## 2017-09-15 NOTE — Progress Notes (Signed)
Bilateral lower extremity venous duplex has been completed. Negative for DVT.  Results were given to the patient's nurse, Amy.  09/15/17 12:06 PM Sandra [name] RVT

## 2017-09-15 NOTE — Procedures (Signed)
Arterial Catheter Insertion Procedure Note TINESHIA BECRAFT [id number] 23-Mar-1929  Procedure: Insertion of Arterial Catheter  Indications: Blood pressure monitoring and Frequent blood sampling  Procedure Details Consent: Unable to obtain consent because of emergent medical necessity. Time Out: Verified patient identification, verified procedure, site/side was marked, verified correct patient position, special equipment/implants available, medications/allergies/relevent history reviewed, required imaging and test results available.  Performed  Maximum sterile technique was used including antiseptics, cap, gloves, gown, hand hygiene, mask and sheet. Skin prep: Chlorhexidine; local anesthetic administered 22 gauge catheter was inserted into left radial artery using the Seldinger technique.  Evaluation Blood flow good; BP tracing good. Complications: No apparent complications.   Winferd Humphrey 09/15/2017

## 2017-09-16 ENCOUNTER — Inpatient Hospital Stay (HOSPITAL_COMMUNITY): Payer: Medicare Other

## 2017-09-16 ENCOUNTER — Encounter (HOSPITAL_COMMUNITY): Payer: Self-pay | Admitting: Cardiology

## 2017-09-16 DIAGNOSIS — N179 Acute kidney failure, unspecified: Secondary | ICD-10-CM

## 2017-09-16 DIAGNOSIS — I5021 Acute systolic (congestive) heart failure: Secondary | ICD-10-CM

## 2017-09-16 DIAGNOSIS — J181 Lobar pneumonia, unspecified organism: Secondary | ICD-10-CM

## 2017-09-16 DIAGNOSIS — R570 Cardiogenic shock: Secondary | ICD-10-CM

## 2017-09-16 LAB — CBC WITH DIFFERENTIAL/PLATELET
BASOS PCT: 0 %
Basophils Absolute: 0 10*3/uL (ref 0.0–0.1)
EOS ABS: 0 10*3/uL (ref 0.0–0.7)
EOS PCT: 0 %
HEMATOCRIT: 41.3 % (ref 36.0–46.0)
Hemoglobin: 13.5 g/dL (ref 12.0–15.0)
Lymphocytes Relative: 11 %
Lymphs Abs: 1.7 10*3/uL (ref 0.7–4.0)
MCH: 29.9 pg (ref 26.0–34.0)
MCHC: 32.7 g/dL (ref 30.0–36.0)
MCV: 91.6 fL (ref 78.0–100.0)
MONO ABS: 1.1 10*3/uL — AB (ref 0.1–1.0)
MONOS PCT: 7 %
NEUTROS ABS: 12.6 10*3/uL — AB (ref 1.7–7.7)
Neutrophils Relative %: 82 %
PLATELETS: 220 10*3/uL (ref 150–400)
RBC: 4.51 MIL/uL (ref 3.87–5.11)
RDW: 14.5 % (ref 11.5–15.5)
WBC: 15.5 10*3/uL — ABNORMAL HIGH (ref 4.0–10.5)

## 2017-09-16 LAB — BASIC METABOLIC PANEL
ANION GAP: 11 (ref 5–15)
BUN: 40 mg/dL — ABNORMAL HIGH (ref 6–20)
CALCIUM: 8.1 mg/dL — AB (ref 8.9–10.3)
CO2: 18 mmol/L — AB (ref 22–32)
CREATININE: 2.42 mg/dL — AB (ref 0.44–1.00)
Chloride: 104 mmol/L (ref 101–111)
GFR calc Af Amer: 20 mL/min — ABNORMAL LOW (ref >60)
GFR, EST NON AFRICAN AMERICAN: 17 mL/min — AB (ref >60)
Glucose, Bld: 157 mg/dL — ABNORMAL HIGH (ref 65–99)
Potassium: 4.2 mmol/L (ref 3.5–5.1)
Sodium: 133 mmol/L — ABNORMAL LOW (ref 135–145)

## 2017-09-16 LAB — URINE CULTURE: Special Requests: NORMAL

## 2017-09-16 LAB — GLUCOSE, CAPILLARY
GLUCOSE-CAPILLARY: 213 mg/dL — AB (ref 65–99)
Glucose-Capillary: 152 mg/dL — ABNORMAL HIGH (ref 65–99)
Glucose-Capillary: 144 mg/dL — ABNORMAL HIGH (ref 65–99)
Glucose-Capillary: 181 mg/dL — ABNORMAL HIGH (ref 65–99)

## 2017-09-16 LAB — PROCALCITONIN: PROCALCITONIN: 3.51 ng/mL

## 2017-09-16 LAB — LEGIONELLA PNEUMOPHILA SEROGP 1 UR AG: L. pneumophila Serogp 1 Ur Ag: NEGATIVE

## 2017-09-16 LAB — MAGNESIUM: Magnesium: 1.6 mg/dL — ABNORMAL LOW (ref 1.7–2.4)

## 2017-09-16 LAB — HIV ANTIBODY (ROUTINE TESTING W REFLEX): HIV Screen 4th Generation wRfx: NONREACTIVE

## 2017-09-16 LAB — PHOSPHORUS: PHOSPHORUS: 4.7 mg/dL — AB (ref 2.5–4.6)

## 2017-09-16 MED ORDER — VITAL HIGH PROTEIN PO LIQD
1000.0000 mL | ORAL | Status: DC
  Administered 2017-09-16: 1000 mL
  Filled 2017-09-16 (×2): qty 1000

## 2017-09-16 MED ORDER — PANTOPRAZOLE SODIUM 40 MG PO PACK
40.0000 mg | PACK | Freq: Every day | ORAL | Status: DC
  Administered 2017-09-17: 40 mg
  Filled 2017-09-16: qty 20

## 2017-09-16 MED ORDER — LEVOFLOXACIN IN D5W 500 MG/100ML IV SOLN
500.0000 mg | INTRAVENOUS | Status: AC
  Administered 2017-09-17 – 2017-09-18 (×2): 500 mg via INTRAVENOUS
  Filled 2017-09-16 (×3): qty 100

## 2017-09-16 MED ORDER — ENOXAPARIN SODIUM 30 MG/0.3ML ~~LOC~~ SOLN
30.0000 mg | SUBCUTANEOUS | Status: DC
  Administered 2017-09-17 (×2): 30 mg via SUBCUTANEOUS
  Filled 2017-09-16 (×2): qty 0.3

## 2017-09-16 MED ORDER — VITAL HIGH PROTEIN PO LIQD
1000.0000 mL | ORAL | Status: DC
  Filled 2017-09-16: qty 1000

## 2017-09-16 MED ORDER — PRO-STAT SUGAR FREE PO LIQD
30.0000 mL | Freq: Two times a day (BID) | ORAL | Status: DC
  Administered 2017-09-16 – 2017-09-17 (×3): 30 mL
  Filled 2017-09-16 (×3): qty 30

## 2017-09-16 NOTE — Progress Notes (Signed)
Brief Nutrition Note  RD consulted for enteral/tube feeding initiation and management.  Intervention: - Vital High Protein @ 40 ml/hr - 30 ml Pro-stat BID  TF regimen provides: 1160 kcals, 114 grams protein, 806 ml free water. Meets 100% of calorie and protein needs.  RD to place order.  Gaynell Face, MS, RD, LDN Pager: [phone] Weekend/After Hours: [phone]

## 2017-09-16 NOTE — Progress Notes (Signed)
.. ..  Name: Sandra [name] MRN: [id number] DOB: 02-19-1929    ADMISSION DATE:  09/18/2017 CONSULTATION DATE:  09/15/17  REFERRING MD :  Marquette Old MD- EDP  CHIEF COMPLAINT:  S/p cardiac arrest  BRIEF PATIENT DESCRIPTION: 84 year old woman with diastolic dysfunction admitted 3/11 with dyspnea, chest x-ray showing bibasilar infiltrates and effusions with elevated BNP 2700, d-dimer slight high, urine strep antigen positive.  Elevated troponin was attributed to demand ischemia. She sustained PEA cardiac arrest on the floor and was transferred to the ICU with ROS C.  SIGNIFICANT EVENTS  Cardiac arrest 3/12 3/12 cardiogenic shock with high PEEP  STUDIES:  09/15/2017 2D echo>> EF 25-30%, diffuse hypo, RVSP 50 09/15/2017 venous duplex lower extremities>>neg    SUBJECTIVE:  Currently sedated on full mechanical ventilatory support.  Issues with oxygenation.  She is also requiring increased pressor support.  We will check pro-calcitonin for completeness  VITAL SIGNS: Temp:  [99.3 F (37.4 C)-100.8 F (38.2 C)] 99.3 F (37.4 C) (03/13 1000) Pulse Rate:  [70-85] 73 (03/13 1000) Resp:  [19-30] 30 (03/13 1000) BP: (99-130)/(52-72) 111/68 (03/13 0500) SpO2:  [94 %-100 %] 100 % (03/13 1000) Arterial [name] BP: (85-133)/(55-79) 133/66 (03/13 1000) FiO2 (%):  [40 %-65 %] 40 % (03/13 1034)  PHYSICAL EXAMINATION: Gen. Frail ,elderly, in mild distress on vent ENT - oral ETT, no post nasal drip Neck: No JVD, no thyromegaly, no carotid bruits Lungs: no use of accessory muscles, no dullness to percussion, decreased left without rales or rhonchi  Cardiovascular: Rhythm regular, heart sounds  normal, no murmurs or gallops, no peripheral edema Abdomen: soft and non-tender, no hepatosplenomegaly, BS normal. Musculoskeletal: No deformities, no cyanosis or clubbing Neuro:  alert, awake on low dose fent , follows commands,non focal    Recent Labs  Lab 09/26/2017 1559 09/15/17 0115 09/16/17 0330  NA 137  138 133*  K 3.8 4.0 4.2  CL 105 107 104  CO2 20* 18* 18*  BUN 19 18 40*  CREATININE 0.88 1.02* 2.42*  GLUCOSE 222* 204* 157*   Recent Labs  Lab 10/01/2017 1559 09/15/17 0115 09/16/17 0330  HGB 14.2 13.4 13.5  HCT 43.4 42.7 41.3  WBC 17.1* 17.9* 15.5*  PLT 242 160 220   Dg Chest 2 View  Result Date: 09/18/2017 CLINICAL DATA:  Shortness of breath with poor oxygen saturation and tachycardia. EXAM: CHEST - 2 VIEW COMPARISON:  11/13/2015 FINDINGS: The heart is enlarged. There is aortic atherosclerosis. There are bilateral pleural effusions with dependent atelectasis. There is patchy density in both lower lobes. Upper lobes are clear. The findings are more consistent with pneumonia, but there could also be an element of congestive heart failure. IMPRESSION: Bilateral effusions, patchy infiltrate/atelectasis in both lower lungs. Findings more consistent with pneumonia, but there could be an element of congestive heart failure. Electronically Signed   By: Nelson Chimes M.D.   On: 09/22/2017 16:34   Dg Abd 1 View  Result Date: 09/15/2017 CLINICAL DATA:  Enteric tube placement. EXAM: ABDOMEN - 1 VIEW COMPARISON:  CT of the abdomen and pelvis performed 03/22/2015 FINDINGS: The patient's enteric tube is noted ending overlying the fundus of the stomach, with the side port about the gastroesophageal junction. There is distention of small-bowel loops to 3.6 cm in diameter. There is suggestion of air within the cecum and at the splenic flexure of the colon. This may reflect ileus. No free intra-abdominal air is seen, though evaluation for free air is limited on a single supine view. No  acute osseous abnormalities are identified. Mild degenerative change is noted at the mid lumbar spine. IMPRESSION: 1. Enteric tube noted ending overlying the fundus of the stomach, with the side port about the gastroesophageal junction. 2. Distention of small-bowel loops to 3.6 cm in diameter. This may reflect ileus, given air  within the colon. Electronically Signed   By: Garald Balding M.D.   On: 09/15/2017 02:34   Dg Chest Port 1 View  Result Date: 09/16/2017 CLINICAL DATA:  Hypoxia EXAM: PORTABLE CHEST 1 VIEW COMPARISON:  September 15, 2017 FINDINGS: Endotracheal tube tip is 3.2 cm above the carina. Nasogastric tube tip and side port are in the stomach. No pneumothorax. There is left lower lobe airspace consolidation with left pleural effusion. There is a minimal right pleural effusion. Lungs elsewhere clear. There is cardiomegaly with pulmonary vascularity within normal limits. There is aortic atherosclerosis. No adenopathy. No bone lesions. IMPRESSION: Tube positions as described without evident pneumothorax. Airspace consolidation left lower lobe with left pleural effusion, increased from 1 day prior. Rather minimal right pleural effusion present. Right lung otherwise clear. Stable cardiac prominence. There is aortic atherosclerosis. Aortic Atherosclerosis (ICD10-I70.0). Electronically Signed   By: Lowella Grip III M.D.   On: 09/16/2017 07:20   Dg Chest Port 1 View  Result Date: 09/15/2017 CLINICAL DATA:  84 y/o  F; post intubation. EXAM: PORTABLE CHEST 1 VIEW COMPARISON:  09/16/2017 chest radiograph. FINDINGS: Stable cardiomegaly given projection and technique. Aortic atherosclerosis with calcification. Endotracheal tube tip projects 1.7 cm above carina. Enteric tube tip extends below field of view and abdomen. Transcutaneous pacing pads noted. Interval increase in patchy opacities throughout the lungs probably representing alveolar pulmonary edema. Pneumonia is possible. Small effusions. Bones are unremarkable. IMPRESSION: 1. Endotracheal tube tip projects 1.7 cm above carina. Enteric tube tip below field of view and abdomen. 2. Increased diffuse patchy opacities probably representing alveolar pulmonary edema. Pneumonia is possible. Small effusions. Electronically Signed   By: Kristine Garbe M.D.   On:  09/15/2017 02:22    ASSESSMENT / PLAN: NEURO: Acute Encephalopathy - improved, post anoxic, neg head CT Fent gtt , low dose RASS goal 0 Continue Neurochecks   CARDIAC:  PEA cardiac arrest NSTEMI -Noted to have ST/T wave abnormalities on EKG HFpEF 55-60% EF with Grade 2 diastolic dysfn, now post arrest EF 25%  -Taper levo to off -cards input -trend trop until peak captured  PULMONARY: Acute hypoxic respiratory failure Secondary to Cardiogenic Pulmonary edema and suspected Community Accquired Pneumonia   Vent settings adjusted - drop PEEP 5, drop RR 22, start SBTs  Acute Kidney Injury -likely ATN p-arrest  -ct foley -Expect to improve once pressors off -Not a candidate for HD  ID: Community Acquired Pneumonia- urine strep Ag + Ct levaquin   Endocrine: Osteoporosis - h/o Vit D deficiency  SSI  GI: Start TFs Start on PPI Q 12h    Heme: Recent Labs    09/15/17 0115 09/16/17 0330  HGB 13.4 13.5    DVT prophylaxis     DISPOSITION: ICU CODE STATUS: DNR PROGNOSIS: guarded but improved FAMILY: Discussed and updated daughter / sons with plan of care.   The patient is critically ill with multiple organ systems failure and requires high complexity decision making for assessment and support, frequent evaluation and titration of therapies, application of advanced monitoring technologies and extensive interpretation of multiple databases. Critical Care Time devoted to patient care services described in this note independent of APP/resident  time is 35 minutes.  Kara Mead MD. Shade Flood. [place] [facility] Pager [phone] If no response call [phone]    09/16/2017, 10:51 AM

## 2017-09-16 NOTE — Consult Note (Addendum)
Cardiology Consultation:   Patient ID: Sandra [name]; [id number]; 1929-06-09   Admit date: 09/06/2017 Date of Consult: 09/16/2017  Primary Care Provider: Cassandria Anger, MD Primary Cardiologist: new   Patient Profile:   SHAWNELL [name] is a 84 y.o. female with a hx of HTN followed by Dr Alain Marion.  She is being seen today for the evaluation of new LVD and elevated Troponin s/p PEA arrest at the request of Dr Elsworth Soho.  History of Present Illness:   Ms. Nembhard is an 84 y/o female who lives in her own home though her family has recently provided assistance with daily chores. The pt has a history of HTN. An echo in Sept 2016 showed her EF to 55% with grade 2 DD. She has no history of CAD, MI, or chest pain. Her family has noted increasing dyspnea and "wheezing" in the past week. She has no history of asthma or COPD. She went to her PCP's office for evaluation and was sent to the ED at [facility]. In the ED she was found to be tachycardic, febrile, and hypoxic.  Lab work showed lactic acidosis and leukocytosis as well as a  D-dimer of 0.8. CXR suggested possible CAP. There was concern for sepsis secondary to pneumonia. Patientwas also felt to have a possible component of CHF. After admission her condition deteriorated to PEA arrest.  She was intubated and ROSC was obtained. She is now intubated and awake in the CCU. She continues to deny chest pain. Echo done 09/15/17 shows her EF to be 25% with diffuse HK. Her EKG shows new poor anterior RW. Troponin up to 4.12 (today at 1500).     Past Medical History:  Diagnosis Date  . Anxiety   . Colon polyps   . HTN (hypertension)   . Hyperlipidemia   . LBP (low back pain) 2011   right  . Osteoporosis   . Peripheral neuropathy   . Strabismus    L eye  . Vitamin B12 deficiency   . Vitamin D deficiency     Past Surgical History:  Procedure Laterality Date  . ABDOMINAL HYSTERECTOMY       Home Medications:  Prior to Admission medications   Medication  Sig Start Date End Date Taking? Authorizing Provider  acetaminophen (TYLENOL) 500 MG tablet Take 500 mg by mouth every 6 (six) hours as needed for moderate pain.   Yes [provider]  amLODipine (NORVASC) 10 MG tablet Take 0.5 tablets (5 mg total) by mouth daily. 03/25/17  Yes Plotnikov, Evie Lacks, MD  Ascorbic Acid (VITAMIN C) 100 MG tablet Take 100 mg by mouth daily.   Yes [provider]  atenolol (TENORMIN) 100 MG tablet Take 1 tablet (100 mg total) by mouth 2 (two) times daily. Annual appt is due must see provider for future refills 03/25/17  Yes Plotnikov, Evie Lacks, MD  Cholecalciferol (VITAMIN D3) 1000 UNITS tablet Take 1,000 Units by mouth daily.     Yes [provider]  cloNIDine (CATAPRES) 0.1 MG tablet TAKE ONE TABLET BY MOUTH TWICE DAILY FOR sweating & blood pressure 03/25/17  Yes Plotnikov, Evie Lacks, MD  clopidogrel (PLAVIX) 75 MG tablet Take 1 tablet (75 mg total) by mouth daily. 03/25/17  Yes Plotnikov, Evie Lacks, MD  diclofenac (VOLTAREN) 50 MG EC tablet Take 1 tablet (50 mg total) by mouth 2 (two) times daily as needed for moderate pain. 08/27/15  Yes Plotnikov, Evie Lacks, MD  diclofenac sodium (VOLTAREN) 1 % GEL Apply 1 application  topically 4 (four) times daily. 02/15/16  Yes Plotnikov, Evie Lacks, MD  Doxepin HCl (SILENOR) 3 MG TABS Take 1 tablet (3 mg total) by mouth at bedtime. Annual appt is due must see provider for future refills 03/25/17  Yes Plotnikov, Evie Lacks, MD  SILENOR 3 MG TABS TAKE ONE TABLET BY MOUTH DAILY AT BEDTIME 06/22/17  Yes Plotnikov, Evie Lacks, MD  traMADol (ULTRAM) 50 MG tablet Take 1-2 tablets (50-100 mg total) by mouth 2 (two) times daily as needed for moderate pain. 08/27/15  Yes Plotnikov, Evie Lacks, MD  traZODone (DESYREL) 150 MG tablet Take 1 tablet (150 mg total) by mouth at bedtime. 03/25/17  Yes Plotnikov, Evie Lacks, MD  triamcinolone ointment (KENALOG) 0.5 % APPLY TOPICALLY TWICE DAILY 07/06/15  Yes Plotnikov, Evie Lacks, MD    vitamin B-12 (CYANOCOBALAMIN) 1000 MCG tablet Take 1,000 mcg by mouth daily.     Yes [provider]    Inpatient Medications: Scheduled Meds: . chlorhexidine gluconate (MEDLINE KIT)  15 mL Mouth Rinse BID  . cholecalciferol  1,000 Units Oral Daily  . clopidogrel  75 mg Oral Daily  . enoxaparin (LOVENOX) injection  30 mg Subcutaneous Q24H  . feeding supplement (PRO-STAT SUGAR FREE 64)  30 mL Per Tube BID  . mouth rinse  15 mL Mouth Rinse QID  . [START ON 09/17/2017] pantoprazole sodium  40 mg Per Tube Daily  . traZODone  150 mg Oral QHS   Continuous Infusions: . sodium chloride 10 mL/hr at 09/16/17 0300  . feeding supplement (VITAL HIGH PROTEIN)    . fentaNYL infusion INTRAVENOUS 75 mcg/hr (09/16/17 1207)  . levofloxacin (LEVAQUIN) IV    . norepinephrine (LEVOPHED) Adult infusion 6 mcg/min (09/16/17 1318)   PRN Meds: Place/Maintain arterial line **AND** sodium chloride, docusate, fentaNYL (SUBLIMAZE) injection, fentaNYL (SUBLIMAZE) injection, midazolam, midazolam, traMADol  Allergies:    Allergies  Allergen    Reactions  Prednisone PCN ASA  ACE-cough  Social History:   Social History   Socioeconomic History  . Marital status: Widowed    Spouse name: Not on file  . Number of children: Not on file  . Years of education: Not on file  . Highest education level: Not on file  Social Needs  . Financial resource strain: Not on file  . Food insecurity - worry: Not on file  . Food insecurity - inability: Not on file  . Transportation needs - medical: Not on file  . Transportation needs - non-medical: Not on file  Occupational History  . Occupation: Retired    Comment: Therapist, art  Tobacco Use  . Smoking status: Never Smoker  . Smokeless tobacco: Never Used  Substance and Sexual Activity  . Alcohol use: No  . Drug use: No  . Sexual activity: No  Other Topics Concern  . Not on file  Social History Narrative   Single widow   Retired   Occupation:  Therapist, art    Family History:    Family History  Problem Relation Age of Onset  . Heart disease Mother 31       chf  . Early death Father   . Heart disease Father 26       mi  . Diabetes Sister   . Heart disease Sister        chf  . Hypertension Unknown      ROS:  Please see the history of present illness.  All other ROS reviewed and negative.     Physical Exam/Data:  Vitals:   09/16/17 0655 09/16/17 0800 09/16/17 0900 09/16/17 1000  BP:      Pulse: 70 72 80 73  Resp: (!) 30 (!) 30 19 (!) 30  Temp: 99.5 F (37.5 C) 99.5 F (37.5 C) 99.5 F (37.5 C) 99.3 F (37.4 C)  TempSrc:      SpO2: 100% 100% 100% 100%  Weight:      Height:        Intake/Output Summary (Last 24 hours) at 09/16/2017 1411 Last data filed at 09/16/2017 0330 Gross per 24 hour  Intake 1661.8 ml  Output 35 ml  Net 1626.8 ml   Filed Weights   09/11/2017 1702 09/15/17 0100  Weight: 140 lb (63.5 kg) 166 lb 7.2 oz (75.5 kg)   Body mass index is 30.44 kg/m.  General:  Elderly female, intubated, awake HEENT: normal Neck: no JVD Endocrine:  No thryomegaly Vascular: No carotid bruits; FA pulses 2+ bilaterally without bruits  Cardiac:  RRR decreased heart sounds Lungs:  Decreased breath sounds, no obvious rales or wheezing Abd: soft, nontender, no hepatomegaly  Ext: no edema Musculoskeletal:  No deformities, BUE and BLE strength normal and equal Skin: warm and dry  Neuro:  CNs 2-12 intact, no focal abnormalities noted Psych:  Normal affect   EKG:  The EKG was personally reviewed and demonstrates:  09/15/17- NSR, ST, down sloping lateral ST depression, RAD Telemetry:  Telemetry was personally reviewed and demonstrates:  NSR with bursts of PAT  Relevant CV Studies: Echo- 09/15/17- Study Conclusions  - Left ventricle: The cavity size was normal. Systolic function was   severely reduced. The estimated ejection fraction was in the   range of 25% to 30%. Diffuse hypokinesis. Doppler  parameters are   consistent with abnormal left ventricular relaxation (grade 1   diastolic dysfunction). - Ventricular septum: Septal motion showed abnormal function and   dyssynergy. The contour showed diastolic flattening. - Aortic valve: Trileaflet; mildly thickened, mildly calcified   leaflets. - Mitral valve: There was mild to moderate regurgitation. - Right ventricle: Systolic function was moderately reduced. - Tricuspid valve: There was moderate regurgitation. - Pulmonary arteries: Systolic pressure was moderately increased.   PA peak pressure: 50 mm Hg (S). - Pericardium, extracardiac: A trivial pericardial effusion was   identified posterior to the heart.  Impressions:  - EF is reduced when compared to prior (55%)  Laboratory Data:  Chemistry Recent Labs  Lab 09/26/2017 1559 09/15/17 0115 09/16/17 0330  NA 137 138 133*  K 3.8 4.0 4.2  CL 105 107 104  CO2 20* 18* 18*  GLUCOSE 222* 204* 157*  BUN 19 18 40*  CREATININE 0.88 1.02* 2.42*  CALCIUM 8.8* 7.8* 8.1*  GFRNONAA 58* 48* 17*  GFRAA >60 56* 20*  ANIONGAP '12 13 11    '$ Recent Labs  Lab 09/06/2017 1559 09/15/17 0115  PROT 6.1* 4.9*  ALBUMIN 3.7 2.7*  AST 26 46*  ALT 27 35  ALKPHOS 65 66  BILITOT 1.9* 1.8*   Hematology Recent Labs  Lab 09/05/2017 1559 09/15/17 0115 09/16/17 0330  WBC 17.1* 17.9* 15.5*  RBC 4.65 4.51 4.51  HGB 14.2 13.4 13.5  HCT 43.4 42.7 41.3  MCV 93.3 94.7 91.6  MCH 30.5 29.7 29.9  MCHC 32.7 31.4 32.7  RDW 14.4 14.4 14.5  PLT 242 160 220   Cardiac Enzymes Recent Labs  Lab 09/17/2017 2326 09/15/17 0115 09/15/17 0334 09/15/17 0900 09/15/17 1115 09/15/17 1459  TROPONINI 0.35* 0.57* 0.62* 1.68* 2.02* 4.12*  No results for input(s): TROPIPOC in the last 168 hours.  BNP Recent Labs  Lab 09/06/2017 1713  BNP 2,708.6*    DDimer  Recent Labs  Lab 10/02/2017 2326  DDIMER 0.81*    Radiology/Studies:  Dg Chest 2 View  Result Date: 10/01/2017 CLINICAL DATA:  Shortness of  breath with poor oxygen saturation and tachycardia. EXAM: CHEST - 2 VIEW COMPARISON:  11/13/2015 FINDINGS: The heart is enlarged. There is aortic atherosclerosis. There are bilateral pleural effusions with dependent atelectasis. There is patchy density in both lower lobes. Upper lobes are clear. The findings are more consistent with pneumonia, but there could also be an element of congestive heart failure. IMPRESSION: Bilateral effusions, patchy infiltrate/atelectasis in both lower lungs. Findings more consistent with pneumonia, but there could be an element of congestive heart failure. Electronically Signed   By: Nelson Chimes M.D.   On: 09/13/2017 16:34   Dg Abd 1 View  Result Date: 09/15/2017 CLINICAL DATA:  Enteric tube placement. EXAM: ABDOMEN - 1 VIEW COMPARISON:  CT of the abdomen and pelvis performed 03/22/2015 FINDINGS: The patient's enteric tube is noted ending overlying the fundus of the stomach, with the side port about the gastroesophageal junction. There is distention of small-bowel loops to 3.6 cm in diameter. There is suggestion of air within the cecum and at the splenic flexure of the colon. This may reflect ileus. No free intra-abdominal air is seen, though evaluation for free air is limited on a single supine view. No acute osseous abnormalities are identified. Mild degenerative change is noted at the mid lumbar spine. IMPRESSION: 1. Enteric tube noted ending overlying the fundus of the stomach, with the side port about the gastroesophageal junction. 2. Distention of small-bowel loops to 3.6 cm in diameter. This may reflect ileus, given air within the colon. Electronically Signed   By: Garald Balding M.D.   On: 09/15/2017 02:34   Dg Chest Port 1 View  Result Date: 09/16/2017 CLINICAL DATA:  Hypoxia EXAM: PORTABLE CHEST 1 VIEW COMPARISON:  September 15, 2017 FINDINGS: Endotracheal tube tip is 3.2 cm above the carina. Nasogastric tube tip and side port are in the stomach. No pneumothorax. There  is left lower lobe airspace consolidation with left pleural effusion. There is a minimal right pleural effusion. Lungs elsewhere clear. There is cardiomegaly with pulmonary vascularity within normal limits. There is aortic atherosclerosis. No adenopathy. No bone lesions. IMPRESSION: Tube positions as described without evident pneumothorax. Airspace consolidation left lower lobe with left pleural effusion, increased from 1 day prior. Rather minimal right pleural effusion present. Right lung otherwise clear. Stable cardiac prominence. There is aortic atherosclerosis. Aortic Atherosclerosis (ICD10-I70.0). Electronically Signed   By: Lowella Grip III M.D.   On: 09/16/2017 07:20   Dg Chest Port 1 View  Result Date: 09/15/2017 CLINICAL DATA:  84 y/o  F; post intubation. EXAM: PORTABLE CHEST 1 VIEW COMPARISON:  09/10/2017 chest radiograph. FINDINGS: Stable cardiomegaly given projection and technique. Aortic atherosclerosis with calcification. Endotracheal tube tip projects 1.7 cm above carina. Enteric tube tip extends below field of view and abdomen. Transcutaneous pacing pads noted. Interval increase in patchy opacities throughout the lungs probably representing alveolar pulmonary edema. Pneumonia is possible. Small effusions. Bones are unremarkable. IMPRESSION: 1. Endotracheal tube tip projects 1.7 cm above carina. Enteric tube tip below field of view and abdomen. 2. Increased diffuse patchy opacities probably representing alveolar pulmonary edema. Pneumonia is possible. Small effusions. Electronically Signed   By: Kristine Garbe M.D.   On: 09/15/2017  02:22    Assessment and Plan:   PEA arrest R/O acute MI though level of Troponin elevation, diffuse HK on echo, ST depression on EKG, and lack of angina history suggest this may be secondary to her respiratory arrest and acute illness. Doubt there is any indication for an urgent coronary angiogram, especially with acute renal injury -SCr 2.42.  Continue to support and cycle Troponin.   HTN With grade 2 DD by echo in 2016   AKI- Previous SCr WNL  PAT Noted on telemetry. She is on IV Levophed. Could try low dose IV Diltiazem or possibly Amiodarone if this recurs, currently in NSR.   Possible CAP Per CCM, she is on IV ABs  H/O ASA intolerance Pt was on Plavix PTA  Plan: Dr Percival Spanish to see, further recommendations to follow.   For questions or updates, please contact Brea Please consult [email or web address] for contact info under Cardiology/STEMI.   Signed, Kerin Ransom, PA-C  09/16/2017 2:11 PM   History and all data above reviewed.  Patient examined.  I agree with the findings as above.  Events as noted.  She appears to have had a primary respiratory event by reading the notes.  (Her family was not present and there are no eye witnesses to interview at this time.)  PEA noted.  Since admission to the ICU, intubation she has had runs of atrial tachycardia.  EKG with IVCD which is new compared to 2016.  She did have anterolateral ST depression anteriorly.  Enzymes are elevated as above.  Now with new cardiomyopathy with diffuse hyokinesis.  AKI. The patient exam reveals COR:RRR  ,  Lungs: Clear  ,  Abd: Positive bowel sounds, no rebound no guarding, Ext Moderate edema  .  All available labs, radiology testing, previous records reviewed. Agree with documented assessment and plan. Acute systolic HF:  She had no prior cardiac history and had a NL EF a couple of years ago.  Now with presentation with progressive respiratory failure with the classic debate between primary pulmonary vs cardiac.  On admission CXR was felt to be consistent with pneumonia. EKG demonstrated essentially non specific changes at that point.  Troponin was mildly elevated.  Then with PEA arrest described in the notes as a primary respiratory event.  I suspect the new acute systolic HF and elevated enzymes (probable demand ischemia with EKG changes) was not the  primary event.  Regardless, she is not a candidate for invasive evaluation with her rising Creat.  She is responding to supportive therapy.  Not a candidate for therapy other than antiplatelet therapy.  (She is on Plavix).  No acute change in therapy until her BP improves and creat levels off/improves.  We will follow with you.        Jeneen Rinks Freya Zobrist  2:51 PM  09/16/2017

## 2017-09-17 ENCOUNTER — Inpatient Hospital Stay (HOSPITAL_COMMUNITY): Payer: Medicare Other

## 2017-09-17 DIAGNOSIS — J9600 Acute respiratory failure, unspecified whether with hypoxia or hypercapnia: Secondary | ICD-10-CM

## 2017-09-17 LAB — GLUCOSE, CAPILLARY
GLUCOSE-CAPILLARY: 123 mg/dL — AB (ref 65–99)
GLUCOSE-CAPILLARY: 202 mg/dL — AB (ref 65–99)
Glucose-Capillary: 220 mg/dL — ABNORMAL HIGH (ref 65–99)
Glucose-Capillary: 218 mg/dL — ABNORMAL HIGH (ref 65–99)

## 2017-09-17 LAB — MAGNESIUM: MAGNESIUM: 1.5 mg/dL — AB (ref 1.7–2.4)

## 2017-09-17 LAB — CBC
HCT: 38.3 % (ref 36.0–46.0)
HEMOGLOBIN: 12.7 g/dL (ref 12.0–15.0)
MCH: 30.2 pg (ref 26.0–34.0)
MCHC: 33.2 g/dL (ref 30.0–36.0)
MCV: 91.2 fL (ref 78.0–100.0)
PLATELETS: 214 10*3/uL (ref 150–400)
RBC: 4.2 MIL/uL (ref 3.87–5.11)
RDW: 14.7 % (ref 11.5–15.5)
WBC: 15.3 10*3/uL — ABNORMAL HIGH (ref 4.0–10.5)

## 2017-09-17 LAB — BASIC METABOLIC PANEL
Anion gap: 12 (ref 5–15)
BUN: 58 mg/dL — ABNORMAL HIGH (ref 6–20)
CHLORIDE: 102 mmol/L (ref 101–111)
CO2: 18 mmol/L — ABNORMAL LOW (ref 22–32)
CREATININE: 3.13 mg/dL — AB (ref 0.44–1.00)
Calcium: 7.9 mg/dL — ABNORMAL LOW (ref 8.9–10.3)
GFR, EST AFRICAN AMERICAN: 14 mL/min — AB (ref >60)
GFR, EST NON AFRICAN AMERICAN: 12 mL/min — AB (ref >60)
Glucose, Bld: 243 mg/dL — ABNORMAL HIGH (ref 65–99)
Potassium: 4.2 mmol/L (ref 3.5–5.1)
SODIUM: 132 mmol/L — AB (ref 135–145)

## 2017-09-17 LAB — PHOSPHORUS: PHOSPHORUS: 5.5 mg/dL — AB (ref 2.5–4.6)

## 2017-09-17 LAB — PROCALCITONIN: PROCALCITONIN: 2.29 ng/mL

## 2017-09-17 LAB — TROPONIN I: Troponin I: 1.05 ng/mL (ref <0.03)

## 2017-09-17 MED ORDER — SODIUM CHLORIDE 0.9 % IV SOLN
0.0000 ug/h | INTRAVENOUS | Status: DC
  Administered 2017-09-17: 100 ug/h via INTRAVENOUS
  Filled 2017-09-17: qty 50

## 2017-09-17 MED ORDER — INSULIN ASPART 100 UNIT/ML ~~LOC~~ SOLN
1.0000 [IU] | SUBCUTANEOUS | Status: DC
  Administered 2017-09-17: 3 [IU] via SUBCUTANEOUS
  Administered 2017-09-17: 2 [IU] via SUBCUTANEOUS
  Administered 2017-09-17: 3 [IU] via SUBCUTANEOUS
  Administered 2017-09-17: 1 [IU] via SUBCUTANEOUS
  Administered 2017-09-17: 3 [IU] via SUBCUTANEOUS
  Administered 2017-09-18: 2 [IU] via SUBCUTANEOUS
  Administered 2017-09-18: 3 [IU] via SUBCUTANEOUS
  Administered 2017-09-18: 1 [IU] via SUBCUTANEOUS
  Administered 2017-09-18 (×2): 3 [IU] via SUBCUTANEOUS
  Administered 2017-09-18: 1 [IU] via SUBCUTANEOUS
  Administered 2017-09-19: 2 [IU] via SUBCUTANEOUS
  Administered 2017-09-19 (×2): 1 [IU] via SUBCUTANEOUS
  Administered 2017-09-19 (×2): 2 [IU] via SUBCUTANEOUS
  Administered 2017-09-20: 1 [IU] via SUBCUTANEOUS

## 2017-09-17 NOTE — Evaluation (Signed)
SLP Cancellation Note  Patient Details Name: Sandra [name] MRN: [id number] DOB: July 24, 1928   Cancelled treatment:       Reason Eval/Treat Not Completed: Other (comment);Medical issues which prohibited therapy(pt severely dysphonic and reports this is not baseline, will follow up next date in am for evaluation in hopes acute edema resolved)  RN and pt informed and agreeable to plan.   Macario Golds 09/17/2017, 3:48 PM  Luanna Salk, [facility] SLP [phone]

## 2017-09-17 NOTE — Progress Notes (Signed)
CRITICAL VALUE ALERT  Critical Value:  Troponin 1.5  Date & Time Notied:  3159 09/17/17  Provider Notified

## 2017-09-17 NOTE — Progress Notes (Addendum)
Progress Note  Patient Name: Sandra [name] Date of Encounter: 09/17/2017  Primary Cardiologist: new - Dr. Percival Spanish  Subjective   Pt awake, extubated this morning. She denies chest pain.  Inpatient Medications    Scheduled Meds: . chlorhexidine gluconate (MEDLINE KIT)  15 mL Mouth Rinse BID  . cholecalciferol  1,000 Units Oral Daily  . clopidogrel  75 mg Oral Daily  . enoxaparin (LOVENOX) injection  30 mg Subcutaneous Q24H  . insulin aspart  1-3 Units Subcutaneous Q4H  . mouth rinse  15 mL Mouth Rinse QID  . pantoprazole sodium  40 mg Per Tube Daily  . traZODone  150 mg Oral QHS   Continuous Infusions: . sodium chloride 10 mL/hr at 09/17/17 0500  . levofloxacin (LEVAQUIN) IV 500 mg (09/17/17 0500)  . norepinephrine (LEVOPHED) Adult infusion Stopped (09/17/17 1058)   PRN Meds: Place/Maintain arterial line **AND** sodium chloride, docusate, traMADol   Vital Signs    Vitals:   09/17/17 0815 09/17/17 0830 09/17/17 0845 09/17/17 1020  BP:      Pulse: 77 76 75   Resp: (!) 24 (!) 22 (!) 22   Temp: 99.5 F (37.5 C) 99.5 F (37.5 C) 99.5 F (37.5 C)   TempSrc:      SpO2: 96% 95% 95% 92%  Weight:      Height:        Intake/Output Summary (Last 24 hours) at 09/17/2017 1447 Last data filed at 09/17/2017 1059 Gross per 24 hour  Intake 2571.53 ml  Output 150 ml  Net 2421.53 ml   Filed Weights   09/04/2017 1702 09/15/17 0100 09/17/17 0500  Weight: 140 lb (63.5 kg) 166 lb 7.2 oz (75.5 kg) 175 lb 7.8 oz (79.6 kg)     Physical Exam   General: Well developed, well nourished, female appearing in no acute distress. Head: Normocephalic, atraumatic.  Neck: Supple without bruits, no JVD Lungs:  Respirations regular, extubated Heart: RRR, S1, S2, no S3, S4, or murmur; no rub. Abdomen: Soft, non-tender, non-distended with normoactive bowel sounds. No hepatomegaly. No rebound/guarding. No obvious abdominal masses. Extremities: No clubbing, cyanosis, trace edema. Distal pedal  pulses are 2+ bilaterally. Neuro: Alert and oriented X 3. Moves all extremities spontaneously. Psych: Normal affect.  Labs    Chemistry Recent Labs  Lab 10/04/2017 1559 09/15/17 0115 09/16/17 0330 09/17/17 0500  NA 137 138 133* 132*  K 3.8 4.0 4.2 4.2  CL 105 107 104 102  CO2 20* 18* 18* 18*  GLUCOSE 222* 204* 157* 243*  BUN 19 18 40* 58*  CREATININE 0.88 1.02* 2.42* 3.13*  CALCIUM 8.8* 7.8* 8.1* 7.9*  PROT 6.1* 4.9*  --   --   ALBUMIN 3.7 2.7*  --   --   AST 26 46*  --   --   ALT 27 35  --   --   ALKPHOS 65 66  --   --   BILITOT 1.9* 1.8*  --   --   GFRNONAA 58* 48* 17* 12*  GFRAA >60 56* 20* 14*  ANIONGAP _0 Hematology Recent Labs  Lab 09/15/17 0115 09/16/17 0330 09/17/17 0500  WBC 17.9* 15.5* 15.3*  RBC 4.51 4.51 4.20  HGB 13.4 13.5 12.7  HCT 42.7 41.3 38.3  MCV 94.7 91.6 91.2  MCH 29.7 29.9 30.2  MCHC 31.4 32.7 33.2  RDW 14.4 14.5 14.7  PLT 160 220 214    Cardiac Enzymes Recent Labs  Lab 09/15/17 0900  09/15/17 1115 09/15/17 1459 09/17/17 0500  TROPONINI 1.68* 2.02* 4.12* 1.05*   No results for input(s): TROPIPOC in the last 168 hours.   BNP Recent Labs  Lab 10/04/2017 1713  BNP 2,708.6*     DDimer  Recent Labs  Lab 09/06/2017 2326  DDIMER 0.81*     Radiology    Dg Chest Port 1 View  Result Date: 09/17/2017 CLINICAL DATA:  Intubation.  Hypoxia. EXAM: PORTABLE CHEST 1 VIEW COMPARISON:  09/16/2017. FINDINGS: Endotracheal tube and NG tube in stable position. Cardiomegaly with bilateral pulmonary interstitial prominence and bilateral pleural effusions consistent with CHF. IMPRESSION: 1.  Lines and tubes in stable position. 2. Cardiomegaly with diffuse bilateral interstitial prominence and bilateral pleural effusions consistent with CHF. Electronically Signed   By: Marcello Moores  Register   On: 09/17/2017 09:33   Dg Chest Port 1 View  Result Date: 09/16/2017 CLINICAL DATA:  Hypoxia EXAM: PORTABLE CHEST 1 VIEW COMPARISON:  September 15, 2017  FINDINGS: Endotracheal tube tip is 3.2 cm above the carina. Nasogastric tube tip and side port are in the stomach. No pneumothorax. There is left lower lobe airspace consolidation with left pleural effusion. There is a minimal right pleural effusion. Lungs elsewhere clear. There is cardiomegaly with pulmonary vascularity within normal limits. There is aortic atherosclerosis. No adenopathy. No bone lesions. IMPRESSION: Tube positions as described without evident pneumothorax. Airspace consolidation left lower lobe with left pleural effusion, increased from 1 day prior. Rather minimal right pleural effusion present. Right lung otherwise clear. Stable cardiac prominence. There is aortic atherosclerosis. Aortic Atherosclerosis (ICD10-I70.0). Electronically Signed   By: Lowella Grip III M.D.   On: 09/16/2017 07:20     Telemetry    sinus - Personally Reviewed  ECG    No new tracings - Personally Reviewed   Cardiac Studies   Echo 09/15/17: Study Conclusions - Left ventricle: The cavity size was normal. Systolic function was   severely reduced. The estimated ejection fraction was in the   range of 25% to 30%. Diffuse hypokinesis. Doppler parameters are   consistent with abnormal left ventricular relaxation (grade 1   diastolic dysfunction). - Ventricular septum: Septal motion showed abnormal function and   dyssynergy. The contour showed diastolic flattening. - Aortic valve: Trileaflet; mildly thickened, mildly calcified   leaflets. - Mitral valve: There was mild to moderate regurgitation. - Right ventricle: Systolic function was moderately reduced. - Tricuspid valve: There was moderate regurgitation. - Pulmonary arteries: Systolic pressure was moderately increased.   PA peak pressure: 50 mm Hg (S). - Pericardium, extracardiac: A trivial pericardial effusion was   identified posterior to the heart.  Impressions: - EF is reduced when compared to prior (55%)  Patient Profile     84  y.o. female with a hx of HTN followed by Dr Alain Marion.  She is being seen today for the evaluation of new LVD and elevated Troponin s/p PEA arrest.  Assessment & Plan    1.PEA arrest Troponin peaked at 4.12 on 09/15/17 at1500, trended down to 1.05. Creatinine continues to rise, now 3.13. Will not pursue ischemic evaluation while in critical condition. It is suspected that her PEA arrest was respiratory in nature since she did not have an acute EKG on admission with a mildly elevated troponin thought to be related to demand ischemia in the setting of respiratory illness. Continue plavix for now. May consider ischemic evaluation after she recovers from this event. She was extubated this morning and denies chest pain.   2. Acute on  chronic systolic and diastolic heart failure Echo with newly reduced EF of 25-30% and grade 1 DD, mild to moderate mitral regurgitation. Will follow fluid status closely. No diuretic at this time.    3. Paroxysmal atrial tachycardia Sinus on telemetry.   Signed, Tami Lin Duke , PA-C 2:47 PM 09/17/2017 Pager: [phone]  History and all data above reviewed.  Patient examined.  I agree with the findings as above.   She denies chest pain.  No SOB.  Denies any history prior to presentation that would suggest angina or HF.  The patient exam reveals COR:RRR  ,  Lungs: Decreased breath sounds  ,  Abd: Decreased BS, Ext no edema  .  All available labs, radiology testing, previous records reviewed. Agree with documented assessment and plan. PEA arrest:  Troponin trend is not diagnostic of an acute coronary event.  Plan is to titrate meds in the days to come as her BP and creat allow.  Reassess echo in the future as an out patient.    Jeneen Rinks Giannina Bartolome  3:44 PM  09/17/2017

## 2017-09-17 NOTE — Procedures (Signed)
Extubation Procedure Note  Patient Details:   Name: Sandra [name] DOB: 1929/03/28 MRN: [id number]   Airway Documentation:  Airway 7.5 mm (Active)  Secured at (cm) 20 cm 09/17/2017  9:00 AM  Measured From Lips 09/17/2017  9:00 AM  Secured Location Center 09/17/2017  9:00 AM  Secured By [employer] 09/17/2017  9:00 AM  Tube Holder Repositioned Yes 09/17/2017  9:00 AM  Cuff Pressure (cm H2O) 24 cm H2O 09/17/2017  9:00 AM  Site Condition Dry 09/17/2017  9:00 AM    Evaluation  O2 sats: stable throughout Complications: No apparent complications Patient did tolerate procedure well. Bilateral Breath Sounds: Clear, Diminished   Yes  Baldwin [place] Nannette 09/17/2017, 10:16 AM   Positive cuff leak pre extubation. Placed PT on 3LPM to achieve MD Sp02 goal of 92 or >. Current Spo2 92%. RN aware PT is extubated.

## 2017-09-17 NOTE — Progress Notes (Signed)
Pharmacist Heart Failure Core Measure Documentation  Assessment: Sandra [name] has an EF documented as 25-30% on 09/15/17 by ECHO.  Rationale: Heart failure patients with left ventricular systolic dysfunction (LVSD) and an EF < 40% should be prescribed an angiotensin converting enzyme inhibitor (ACEI) or angiotensin receptor blocker (ARB) at discharge unless a contraindication is documented in the medical record.  This patient is not currently on an ACEI or ARB for HF.  This note is being placed in the record in order to provide documentation that a contraindication to the use of these agents is present for this encounter.  ACE Inhibitor or Angiotensin Receptor Blocker is contraindicated (specify all that apply)  [x]   ACEI allergy AND ARB allergy []   Angioedema []   Moderate or severe aortic stenosis []   Hyperkalemia []   Hypotension []   Renal artery stenosis [x]   Worsening renal function, preexisting renal disease or dysfunction    Doreene Eland, PharmD, BCPS.   09/17/2017 2:11 PM

## 2017-09-17 NOTE — Progress Notes (Signed)
.. ..  Name: Sandra [name] MRN: [id number] DOB: 09/26/1928    ADMISSION DATE:  09/24/2017 CONSULTATION DATE:  09/15/17  REFERRING MD :  Marquette Old MD- EDP  CHIEF COMPLAINT:  S/p cardiac arrest  BRIEF PATIENT DESCRIPTION: 84 year old woman with diastolic dysfunction admitted 3/11 with dyspnea, chest x-ray showing bibasilar infiltrates and effusions with elevated BNP 2700, d-dimer slight high, urine strep antigen positive.  Elevated troponin was attributed to demand ischemia. She sustained PEA cardiac arrest on the floor and was transferred to the ICU with ROS C.  SIGNIFICANT EVENTS  Cardiac arrest 3/12 3/12 cardiogenic shock with high PEEP  STUDIES:  09/15/2017 2D echo>> EF 25-30%, diffuse hypo, RVSP 50 09/15/2017 venous duplex lower extremities>>neg    SUBJECTIVE:  Afebrile Denies CP/ dyspnea remains critically ill, intubated, poor UO  VITAL SIGNS: Temp:  [99.1 F (37.3 C)-99.5 F (37.5 C)] 99.5 F (37.5 C) (03/14 0845) Pulse Rate:  [69-85] 75 (03/14 0845) Resp:  [12-30] 22 (03/14 0845) SpO2:  [92 %-100 %] 95 % (03/14 0845) Arterial Line BP: (101-133)/(46-66) 114/52 (03/14 0845) FiO2 (%):  [30 %-40 %] 35 % (03/14 0900) Weight:  [175 lb 7.8 oz (79.6 kg)] 175 lb 7.8 oz (79.6 kg) (03/14 0500)  PHYSICAL EXAMINATION: Gen. Frail ,elderly,no distress on vent ENT - oral ETT , pupils 75mm BERTL Neck: No JVD, no thyromegaly, no carotid bruits Lungs: no use of accessory muscles, no dullness to percussion, clear without rales or rhonchi  Cardiovascular: Rhythm regular, heart sounds  normal, no murmurs or gallops, no peripheral edema Abdomen: soft and non-tender, no hepatosplenomegaly, BS normal. Musculoskeletal: No deformities, no cyanosis or clubbing Neuro:  Awake,alert, non focal , off sedation     Recent Labs  Lab 09/15/17 0115 09/16/17 0330 09/17/17 0500  NA 138 133* 132*  K 4.0 4.2 4.2  CL 107 104 102  CO2 18* 18* 18*  BUN 18 40* 58*  CREATININE 1.02* 2.42* 3.13*    GLUCOSE 204* 157* 243*   Recent Labs  Lab 09/15/17 0115 09/16/17 0330 09/17/17 0500  HGB 13.4 13.5 12.7  HCT 42.7 41.3 38.3  WBC 17.9* 15.5* 15.3*  PLT 160 220 214   Dg Chest Port 1 View  Result Date: 09/17/2017 CLINICAL DATA:  Intubation.  Hypoxia. EXAM: PORTABLE CHEST 1 VIEW COMPARISON:  09/16/2017. FINDINGS: Endotracheal tube and NG tube in stable position. Cardiomegaly with bilateral pulmonary interstitial prominence and bilateral pleural effusions consistent with CHF. IMPRESSION: 1.  Lines and tubes in stable position. 2. Cardiomegaly with diffuse bilateral interstitial prominence and bilateral pleural effusions consistent with CHF. Electronically Signed   By: Marcello Moores  Register   On: 09/17/2017 09:33   Dg Chest Port 1 View  Result Date: 09/16/2017 CLINICAL DATA:  Hypoxia EXAM: PORTABLE CHEST 1 VIEW COMPARISON:  September 15, 2017 FINDINGS: Endotracheal tube tip is 3.2 cm above the carina. Nasogastric tube tip and side port are in the stomach. No pneumothorax. There is left lower lobe airspace consolidation with left pleural effusion. There is a minimal right pleural effusion. Lungs elsewhere clear. There is cardiomegaly with pulmonary vascularity within normal limits. There is aortic atherosclerosis. No adenopathy. No bone lesions. IMPRESSION: Tube positions as described without evident pneumothorax. Airspace consolidation left lower lobe with left pleural effusion, increased from 1 day prior. Rather minimal right pleural effusion present. Right lung otherwise clear. Stable cardiac prominence. There is aortic atherosclerosis. Aortic Atherosclerosis (ICD10-I70.0). Electronically Signed   By: Lowella Grip III M.D.   On: 09/16/2017 07:20  ASSESSMENT / PLAN: NEURO: Acute Encephalopathy - improved, post anoxic, neg head CT Dc sedation RASS goal 0 More detailed neuro eval p-extubation   CARDIAC:  PEA cardiac arrest / cardiogenic shock NSTEMI -Noted to have ST/T wave abnormalities  on EKG, peak Tp 4 HFpEF 55-60% EF with Grade 2 diastolic dysfn, now post arrest EF 25%  -Taper levo to off -cards input appreciated   PULMONARY: Acute hypoxic respiratory failure Secondary to Cardiogenic Pulmonary edema and suspected Community Accquired Pneumonia  Tolerating SBTs Extubate  Acute Kidney Injury -likely ATN p-arrest  -ct foley -Expect to improve now that pressors off -Not a candidate for HD  ID: Community Acquired Pneumonia- urine strep Ag + Ct levaquin   Endocrine: Osteoporosis - h/o Vit D deficiency  SSI  GI: dc TFs, advance PO after extubation PPI     Heme: Recent Labs    09/16/17 0330 09/17/17 0500  HGB 13.5 12.7    DVT prophylaxis     DISPOSITION: ICU CODE STATUS: DNR PROGNOSIS: guarded but improved FAMILY: Discussed and updated daughter / sons with plan of care.   The patient is critically ill with multiple organ systems failure and requires high complexity decision making for assessment and support, frequent evaluation and titration of therapies, application of advanced monitoring technologies and extensive interpretation of multiple databases. Critical Care Time devoted to patient care services described in this note independent of APP/resident  time is 33 minutes.   Kara Mead MD. Shade Flood. [facility] Pager [phone] If no response call [phone]    09/17/2017, 9:59 AM

## 2017-09-17 NOTE — Progress Notes (Signed)
Fentanyl drip wasted with Antony Blackbird RN 180cc (10 mcg/ml)

## 2017-09-18 ENCOUNTER — Inpatient Hospital Stay (HOSPITAL_COMMUNITY): Payer: Medicare Other

## 2017-09-18 DIAGNOSIS — A403 Sepsis due to Streptococcus pneumoniae: Secondary | ICD-10-CM

## 2017-09-18 DIAGNOSIS — I4891 Unspecified atrial fibrillation: Secondary | ICD-10-CM

## 2017-09-18 DIAGNOSIS — R609 Edema, unspecified: Secondary | ICD-10-CM

## 2017-09-18 LAB — GLUCOSE, CAPILLARY
GLUCOSE-CAPILLARY: 164 mg/dL — AB (ref 65–99)
GLUCOSE-CAPILLARY: 193 mg/dL — AB (ref 65–99)
GLUCOSE-CAPILLARY: 145 mg/dL — AB (ref 65–99)
GLUCOSE-CAPILLARY: 231 mg/dL — AB (ref 65–99)
GLUCOSE-CAPILLARY: 254 mg/dL — AB (ref 65–99)
Glucose-Capillary: 196 mg/dL — ABNORMAL HIGH (ref 65–99)
Glucose-Capillary: 136 mg/dL — ABNORMAL HIGH (ref 65–99)
Glucose-Capillary: 231 mg/dL — ABNORMAL HIGH (ref 65–99)

## 2017-09-18 LAB — CBC
HCT: 35.7 % — ABNORMAL LOW (ref 36.0–46.0)
Hemoglobin: 11.9 g/dL — ABNORMAL LOW (ref 12.0–15.0)
MCH: 30.2 pg (ref 26.0–34.0)
MCHC: 33.3 g/dL (ref 30.0–36.0)
MCV: 90.6 fL (ref 78.0–100.0)
PLATELETS: 152 10*3/uL (ref 150–400)
RBC: 3.94 MIL/uL (ref 3.87–5.11)
RDW: 14.6 % (ref 11.5–15.5)
WBC: 12.9 10*3/uL — AB (ref 4.0–10.5)

## 2017-09-18 LAB — BASIC METABOLIC PANEL
ANION GAP: 12 (ref 5–15)
BUN: 73 mg/dL — ABNORMAL HIGH (ref 6–20)
CALCIUM: 8 mg/dL — AB (ref 8.9–10.3)
CO2: 18 mmol/L — ABNORMAL LOW (ref 22–32)
Chloride: 104 mmol/L (ref 101–111)
Creatinine, Ser: 3.03 mg/dL — ABNORMAL HIGH (ref 0.44–1.00)
GFR calc Af Amer: 15 mL/min — ABNORMAL LOW (ref >60)
GFR, EST NON AFRICAN AMERICAN: 13 mL/min — AB (ref >60)
Glucose, Bld: 183 mg/dL — ABNORMAL HIGH (ref 65–99)
POTASSIUM: 4.2 mmol/L (ref 3.5–5.1)
SODIUM: 134 mmol/L — AB (ref 135–145)

## 2017-09-18 LAB — HEPARIN LEVEL (UNFRACTIONATED): Heparin Unfractionated: 0.12 IU/mL — ABNORMAL LOW (ref 0.30–0.70)

## 2017-09-18 MED ORDER — DEXTROSE-NACL 5-0.45 % IV SOLN
INTRAVENOUS | Status: DC
  Administered 2017-09-18: 11:00:00 via INTRAVENOUS

## 2017-09-18 MED ORDER — LIP MEDEX EX OINT
TOPICAL_OINTMENT | CUTANEOUS | Status: AC
  Administered 2017-09-18: 17:00:00
  Filled 2017-09-18: qty 7

## 2017-09-18 MED ORDER — AMIODARONE HCL IN DEXTROSE 360-4.14 MG/200ML-% IV SOLN
30.0000 mg/h | INTRAVENOUS | Status: DC
  Administered 2017-09-19 – 2017-09-20 (×3): 30 mg/h via INTRAVENOUS
  Filled 2017-09-18 (×4): qty 200

## 2017-09-18 MED ORDER — DILTIAZEM HCL-DEXTROSE 100-5 MG/100ML-% IV SOLN (PREMIX)
5.0000 mg/h | INTRAVENOUS | Status: DC
  Administered 2017-09-18: 7.5 mg/h via INTRAVENOUS
  Filled 2017-09-18: qty 100

## 2017-09-18 MED ORDER — HEPARIN BOLUS VIA INFUSION
2000.0000 [IU] | Freq: Once | INTRAVENOUS | Status: AC
  Administered 2017-09-18: 2000 [IU] via INTRAVENOUS
  Filled 2017-09-18: qty 2000

## 2017-09-18 MED ORDER — AMIODARONE LOAD VIA INFUSION
150.0000 mg | Freq: Once | INTRAVENOUS | Status: AC
  Administered 2017-09-18: 150 mg via INTRAVENOUS
  Filled 2017-09-18: qty 83.34

## 2017-09-18 MED ORDER — AMIODARONE HCL IN DEXTROSE 360-4.14 MG/200ML-% IV SOLN
60.0000 mg/h | INTRAVENOUS | Status: AC
  Administered 2017-09-18 (×2): 60 mg/h via INTRAVENOUS
  Filled 2017-09-18: qty 400

## 2017-09-18 MED ORDER — HEPARIN (PORCINE) IN NACL 100-0.45 UNIT/ML-% IJ SOLN
1150.0000 [IU]/h | INTRAMUSCULAR | Status: DC
  Administered 2017-09-18: 800 [IU]/h via INTRAVENOUS
  Administered 2017-09-19: 1150 [IU]/h via INTRAVENOUS
  Filled 2017-09-18 (×2): qty 250

## 2017-09-18 NOTE — Progress Notes (Signed)
ANTICOAGULATION CONSULT NOTE - Initial Consult  Pharmacy Consult for Heparin Indication: atrial fibrillation  Patient Measurements: Height: 5\' 2"  (157.5 cm) Weight: 164 lb 10.9 oz (74.7 kg) IBW/kg (Calculated) : 50.1 HEPARIN DW (KG): 62.9   Vital Signs: Temp: 99.1 F (37.3 C) (03/15 1130) Temp Source: Oral (03/15 1127) BP: 150/85 (03/15 1130) Pulse Rate: 148 (03/15 1130)  Labs: Recent Labs    09/15/17 1459  09/16/17 0330 09/17/17 0500 09/18/17 0500  HGB  --    < > 13.5 12.7 11.9*  HCT  --   --  41.3 38.3 35.7*  PLT  --   --  220 214 152  CREATININE  --   --  2.42* 3.13* 3.03*  TROPONINI 4.12*  --   --  1.05*  --    < > = values in this interval not displayed.    Estimated Creatinine Clearance: 12.6 mL/min (A) (by C-G formula based on SCr of 3.03 mg/dL (H)).   Medical History: Past Medical History:  Diagnosis Date  . Anxiety   . Colon polyps   . HTN (hypertension)   . Hyperlipidemia   . LBP (low back pain) 2011   right  . Osteoporosis   . Peripheral neuropathy   . Strabismus    L eye  . Vitamin B12 deficiency   . Vitamin D deficiency     Medications:  Infusions:  . sodium chloride 10 mL/hr at 09/18/17 0500  . amiodarone     Followed by  . amiodarone    . dextrose 5 % and 0.45% NaCl 50 mL/hr at 09/18/17 1040  . diltiazem (CARDIZEM) infusion 15 mg/hr (09/18/17 1146)  . levofloxacin (LEVAQUIN) IV 500 mg (09/17/17 0500)    Assessment: 84 yo F s/p PEA arrest, acute respiratory failure secondary to CAP.  Now in Afib.  Pharmacy asked to start IV heparin.  She is not on any anticoagulants PTA.  She has been on Lovenox for VTE px since admission; last dose given ~14h ago.  CBC- WNL on admission.  No bleeding noted.  AKI noted.   Goal of Therapy:  Heparin level 0.3-0.7 units/ml Monitor platelets by anticoagulation protocol: Yes   Plan:  Give 2000 units bolus x 1 Start heparin infusion at 800 units/hr Check anti-Xa level in 8 hours and daily while on  heparin Continue to monitor H&H and platelets  Sandra [name], Sandra [name] 09/18/2017,12:33 PM

## 2017-09-18 NOTE — Progress Notes (Signed)
Bilateral lower extremity venous duplex completed. Technically difficult and limited due to edema and involuntary movement of the legs. There is no obvious evidence of DVTor superficial thrombosis noted in the segments of the veins able to be imaged. There is no evidence of a Baker's cyst. Toma Copier, RVS 09/18/2017, 3:37 PM

## 2017-09-18 NOTE — Evaluation (Signed)
Clinical/Bedside Swallow Evaluation Patient Details  Name: Sandra [name] MRN: [id number] Date of Birth: 09-Dec-1928  Today's Date: 09/18/2017 Time: SLP Start Time (ACUTE ONLY): 0845 SLP Stop Time (ACUTE ONLY): 0903 SLP Time Calculation (min) (ACUTE ONLY): 18 min  Past Medical History:  Past Medical History:  Diagnosis Date  . Anxiety   . Colon polyps   . HTN (hypertension)   . Hyperlipidemia   . LBP (low back pain) 2011   right  . Osteoporosis   . Peripheral neuropathy   . Strabismus    L eye  . Vitamin B12 deficiency   . Vitamin D deficiency    Past Surgical History:  Past Surgical History:  Procedure Laterality Date  . ABDOMINAL HYSTERECTOMY     HPI:  84 yo female adm to [facility] with respiratory deficits- she had PEA and required CPR, intubation.   Pt CXR 09/17/2017 showed bilateral interstitial prominence with bilateral effusions c/w CHF.  Pt has h/o cervical dysphagia- with findings of esophageal spasm with tertiary contractions and siliding hiatal hernia- 04/2002.        Assessment / Plan / Recommendation Clinical Impression  Pt presenting with signs of pharyngeal=cervical esophageal dysphagia with concerns for aspiration and lack of airway protection.   Pt with immediate cough post-swallow - which causes her excessive discomfort due to CPR.  Pt also with complaint of pharyngeal residuals with applesauce pointing to pharynx.  Liquid swallows facilitated sensation of clearance.  Pt will benefit from instrumental evaluation.  She does report h/o coughing/choking with thin liquids over the last few weeks but denies other medical issues.  Also has h/o tertiary contractions - ? spasms, and faint reflux per UGI in 2003.  Recommend npo x oral moisture - and plan MBS today.  SLP Visit Diagnosis: Dysphagia, pharyngoesophageal phase (R13.14);Dysphagia, pharyngeal phase (R13.13)    Aspiration Risk  Severe aspiration risk;Risk for inadequate nutrition/hydration    Diet Recommendation NPO         Other  Recommendations   MBS  Follow up Recommendations (tbd)      Frequency and Duration   tbd         Prognosis   tbd     Swallow Study   General Date of Onset: 09/18/17 HPI: 84 yo female adm to [facility] with respiratory deficits- she had PEA and required CPR, intubation.   Pt CXR 09/17/2017 showed bilateral interstitial prominence with bilateral effusions c/w CHF.  Pt has h/o cervical dysphagia- with findings of esophageal spasm with tertiary contractions and siliding hiatal hernia- 04/2002.      Type of Study: Bedside Swallow Evaluation Diet Prior to this Study: NPO Temperature Spikes Noted: No Respiratory Status: Nasal cannula History of Recent Intubation: No Behavior/Cognition: Alert;Cooperative;Pleasant mood Oral Cavity Assessment: Within Functional Limits Oral Care Completed by SLP: No Oral Cavity - Dentition: Adequate natural dentition Vision: Functional for self-feeding Self-Feeding Abilities: Able to feed self Patient Positioning: Upright in bed Baseline Vocal Quality: Aphonic Volitional Cough: Weak Volitional Swallow: Unable to elicit    Oral/Motor/Sensory Function Overall Oral Motor/Sensory Function: Generalized oral weakness   Ice Chips Ice chips: Not tested   Thin Liquid Thin Liquid: Impaired Presentation: Spoon Pharyngeal  Phase Impairments: Cough - Immediate    Nectar Thick Nectar Thick Liquid: Impaired Presentation: Spoon Pharyngeal Phase Impairments: Cough - Immediate   Honey Thick Honey Thick Liquid: Not tested   Puree Puree: Impaired Presentation: Spoon Pharyngeal Phase Impairments: Multiple swallows Other Comments: c/o residuals   Solid   GO  Solid: Not tested        Macario Golds 09/18/2017,9:13 AM  Luanna Salk, [place] [facility] SLP [phone]

## 2017-09-18 NOTE — Progress Notes (Signed)
PT Cancellation Note  Patient Details Name: Sandra [name] MRN: [id number] DOB: 07-Nov-1928   Cancelled Treatment:    Reason Eval/Treat Not Completed: Medical issues which prohibited therapy(HR 150-160 at rest, will hold PT today. Will check back tomorrow. )   Philomena Doheny 09/18/2017, 12:59 PM [phone]

## 2017-09-18 NOTE — Progress Notes (Signed)
Pt's HR increased and sustained in the 140's-150's.  Pt told to cough and bear down with no change.  BP remained stable.  12-lead performed, preliminary result showed A-fib RVR.  Pt stated her chest hurt a little bit, but nothing more than her baseline.  MD made aware. Orders placed to begin a Cardizem drip.  Will continue to monitor.

## 2017-09-18 NOTE — Progress Notes (Signed)
Modified Barium Swallow Progress Note  Patient Details  Name: Sandra [name] MRN: [id number] Date of Birth: 05-27-1929  Today's Date: 09/18/2017  Modified Barium Swallow completed.  Full report located under Chart Review in the Imaging Section.  Brief recommendations include the following:  Clinical Impression  Pt with much improved phonatory ability by the time MBS completed.  NO aspiration observed and mild flash (deep) penetration of thin noted that adequately cleared.  Minimal delay in oral coordination/transiting with pill and liquid primarily.  Pt had overall strong swallow without severe residuals. She did become mildly short of breath and with increased HR during MBS, therefore rec dys3/thin diet with aspiration and esophageal precautions.    Swallow Evaluation Recommendations       SLP Diet Recommendations: Dysphagia 3 (Mech soft) solids;Thin liquid   Liquid Administration via: Cup   Medication Administration: Whole meds with puree(start and follow with liquids)   Supervision: Staff to assist with self feeding   Compensations: Minimize environmental distractions;Slow rate;Small sips/bites   Postural Changes: Seated upright at 90 degrees;Remain semi-upright after after feeds/meals (Comment)   Oral Care Recommendations: Oral care BID       Luanna Salk, MS [facility] SLP [phone]  Macario Golds 09/18/2017,4:26 PM

## 2017-09-18 NOTE — Progress Notes (Signed)
PROGRESS NOTE    Sandra [name]  WJX:[id number] DOB: [birth year] DOA: 09/16/2017 PCP: Cassandria Anger, MD    Brief Narrative:  84 year old female who presented with dyspnea.  She does have a significant past medical history for hypertension, and dyslipidemia.  She complained of worsening dyspnea for the last 2 weeks prior to hospitalization, progressing into altered mentation.  On her initial physical examination blood pressure 159/87, heart rate 100-120, temperature 99.1, respiratory rate 20 to 30, oxygen saturation 98%.  Lungs with bilateral rales, no wheezing, heart S1-S2 present, tachycardic, abdomen is soft nontender, no lower extremity edema.  Sodium 137, potassium 3.8, chloride 105, bicarb 20, glucose 222, BUN 19, creatinine 0.88, white count 17.1, hemoglobin 14.2, hematocrit 43.3, platelets 242, d-dimer 0.81, urine analysis 150 glucose, pH 5, protein 100, specific gravity 1.020, 0-5 RBCs, 0-5 white cells.  Chest x-ray with left lower lobe infiltrate, positive air bronchograms, bilateral pleural effusions.   Patient was admitted to the hospital with a working diagnosis of sepsis due to community-acquired pneumonia.   Patient developed acute hypoxic respiratory failure, and had a pulseless electrical activity cardiac arrest, required ACLS procedures and was placed on invasive mechanical ventilation.   Assessment & Plan:   Principal Problem:   CAP (community acquired pneumonia) Active Problems:   Sepsis ([place])   Cardiac arrest ([place])   Endotracheally intubated   1.  Acute hypoxic respiratory failure due to community-acquired pneumonia. Streptococcal pneumonia/ present on admission, complicated by sepsis/status post cardiac arrest PEA. Patient oxygenating well on nasal cannula, up to 95% on 3 LPM flow. Urinary antigen positive for Streptococcal pneumonia, blood cultures no growth, continue antibiotic therapy with levofloxacin IV (penicillin allergy). Continue to follow on cell count,  cultures and temperature curve. Wbc down to 12,9. Follow on speech evaluation.   2.  Acute kidney injury. Serum cr at 3.0, will continue gentle hydration, avoid hypotension, patient sp cardiac arrest, avoid further hypotension. Serum  K at 4,2, with serum bicarbonate at 18 with non anion gap. Stop pantoprazole.   3.  Hypertension. Holding hypertensive agents, due to hypotension, will continue close monitoring, gentle hydration with IV fluids. At home on amlodipine, atenolol, clonidine  4. Depression. Continue trazodone.    DVT prophylaxis: enoxaparin  Code Status:  dnr Family Communication: no family at the bedside Disposition Plan: home when clinically improved   Consultants:     Procedures:     Antimicrobials:   Ceftriaxone.     Subjective: Improved dyspnea, no chest pain, no nausea or vomiting. No abdominal pain. At home ambulates with no walker or cane.   Objective: Vitals:   09/18/17 0600 09/18/17 0700 09/18/17 0730 09/18/17 0800  BP:      Pulse: 77 83 79 80  Resp: (!) 21 (!) 29 (!) 21 (!) 25  Temp: 99.1 F (37.3 C) 99.1 F (37.3 C) 99.3 F (37.4 C) 99.1 F (37.3 C)  TempSrc:      SpO2: 95% 93% 95% 95%  Weight:      Height:        Intake/Output Summary (Last 24 hours) at 09/18/2017 0839 Last data filed at 09/18/2017 0500 Gross per 24 hour  Intake 700.75 ml  Output 250 ml  Net 450.75 ml   Filed Weights   09/15/17 0100 09/17/17 0500 09/18/17 0300  Weight: 75.5 kg (166 lb 7.2 oz) 79.6 kg (175 lb 7.8 oz) 74.7 kg (164 lb 10.9 oz)    Examination:   General: Not in pain or dyspnea, deconditioned Neurology:  Awake and alert, non focal  E ENT: mild pallor, no icterus, oral mucosa moist Cardiovascular: No JVD. S1-S2 present, rhythmic, no gallops, rubs, or murmurs. Trace lower extremity edema. Pulmonary: decreased breath sounds bilaterally, poor air movement, no wheezing, or rhonchi, scattered rales. Gastrointestinal. Abdomen, no organomegaly, non tender, no  rebound or guarding Skin. No rashes Musculoskeletal: no joint deformities     Data Reviewed: I have personally reviewed following labs and imaging studies  CBC: Recent Labs  Lab 09/04/2017 1559 09/15/17 0115 09/16/17 0330 09/17/17 0500 09/18/17 0500  WBC 17.1* 17.9* 15.5* 15.3* 12.9*  NEUTROABS 15.0*  --  12.6*  --   --   HGB 14.2 13.4 13.5 12.7 11.9*  HCT 43.4 42.7 41.3 38.3 35.7*  MCV 93.3 94.7 91.6 91.2 90.6  PLT 242 160 220 214 749   Basic Metabolic Panel: Recent Labs  Lab 09/20/2017 1559 09/15/17 0115 09/16/17 0330 09/17/17 0500 09/18/17 0500  NA 137 138 133* 132* 134*  K 3.8 4.0 4.2 4.2 4.2  CL 105 107 104 102 104  CO2 20* 18* 18* 18* 18*  GLUCOSE 222* 204* 157* 243* 183*  BUN 19 18 40* 58* 73*  CREATININE 0.88 1.02* 2.42* 3.13* 3.03*  CALCIUM 8.8* 7.8* 8.1* 7.9* 8.0*  MG  --   --  1.6* 1.5*  --   PHOS  --   --  4.7* 5.5*  --    GFR: Estimated Creatinine Clearance: 12.6 mL/min (A) (by C-G formula based on SCr of 3.03 mg/dL (H)). Liver Function Tests: Recent Labs  Lab 09/10/2017 1559 09/15/17 0115  AST 26 46*  ALT 27 35  ALKPHOS 65 66  BILITOT 1.9* 1.8*  PROT 6.1* 4.9*  ALBUMIN 3.7 2.7*   No results for input(s): LIPASE, AMYLASE in the last 168 hours. No results for input(s): AMMONIA in the last 168 hours. Coagulation Profile: No results for input(s): INR, PROTIME in the last 168 hours. Cardiac Enzymes: Recent Labs  Lab 09/15/17 0334 09/15/17 0900 09/15/17 1115 09/15/17 1459 09/17/17 0500  TROPONINI 0.62* 1.68* 2.02* 4.12* 1.05*   BNP (last 3 results) No results for input(s): PROBNP in the last 8760 hours. HbA1C: No results for input(s): HGBA1C in the last 72 hours. CBG: Recent Labs  Lab 09/17/17 1531 09/17/17 1923 09/17/17 2305 09/18/17 0307 09/18/17 0732  GLUCAP 164* 196* 202* 193* 145*   Lipid Profile: No results for input(s): CHOL, HDL, LDLCALC, TRIG, CHOLHDL, LDLDIRECT in the last 72 hours. Thyroid Function Tests: No results  for input(s): TSH, T4TOTAL, FREET4, T3FREE, THYROIDAB in the last 72 hours. Anemia Panel: No results for input(s): VITAMINB12, FOLATE, FERRITIN, TIBC, IRON, RETICCTPCT in the last 72 hours.    Radiology Studies: I have reviewed all of the imaging during this hospital visit personally     Scheduled Meds: . chlorhexidine gluconate (MEDLINE KIT)  15 mL Mouth Rinse BID  . cholecalciferol  1,000 Units Oral Daily  . clopidogrel  75 mg Oral Daily  . enoxaparin (LOVENOX) injection  30 mg Subcutaneous Q24H  . insulin aspart  1-3 Units Subcutaneous Q4H  . mouth rinse  15 mL Mouth Rinse QID  . pantoprazole sodium  40 mg Per Tube Daily  . traZODone  150 mg Oral QHS   Continuous Infusions: . sodium chloride 10 mL/hr at 09/18/17 0500  . levofloxacin (LEVAQUIN) IV 500 mg (09/17/17 0500)  . norepinephrine (LEVOPHED) Adult infusion Stopped (09/17/17 1058)     LOS: 4 days        Jhana Giarratano  Gerome Apley, MD [facility] Pager [phone]

## 2017-09-18 NOTE — Progress Notes (Addendum)
Progress Note  Patient Name: Sandra [name] Date of Encounter: 09/18/2017  Primary Cardiologist: new - Dr. Percival Spanish  Subjective   Pt continues to deny chest pain and palpitations despite being in the 160s  Inpatient Medications    Scheduled Meds: . chlorhexidine gluconate (MEDLINE KIT)  15 mL Mouth Rinse BID  . cholecalciferol  1,000 Units Oral Daily  . clopidogrel  75 mg Oral Daily  . enoxaparin (LOVENOX) injection  30 mg Subcutaneous Q24H  . insulin aspart  1-3 Units Subcutaneous Q4H  . mouth rinse  15 mL Mouth Rinse QID  . traZODone  150 mg Oral QHS   Continuous Infusions: . sodium chloride 10 mL/hr at 09/18/17 0500  . dextrose 5 % and 0.45% NaCl 50 mL/hr at 09/18/17 1040  . diltiazem (CARDIZEM) infusion 15 mg/hr (09/18/17 1146)  . levofloxacin (LEVAQUIN) IV 500 mg (09/17/17 0500)   PRN Meds: Place/Maintain arterial line **AND** sodium chloride, docusate, traMADol   Vital Signs    Vitals:   09/18/17 1100 09/18/17 1115 09/18/17 1127 09/18/17 1130  BP: (!) 150/102 (!) 147/88  (!) 150/85  Pulse: 75 (!) 117  (!) 148  Resp: (!) 26 (!) 23  19  Temp: 99.1 F (37.3 C) 99.1 F (37.3 C) 98.2 F (36.8 C) 99.1 F (37.3 C)  TempSrc:   Oral   SpO2: 92% 92%  92%  Weight:      Height:        Intake/Output Summary (Last 24 hours) at 09/18/2017 1159 Last data filed at 09/18/2017 0500 Gross per 24 hour  Intake 440 ml  Output 250 ml  Net 190 ml   Filed Weights   09/15/17 0100 09/17/17 0500 09/18/17 0300  Weight: 166 lb 7.2 oz (75.5 kg) 175 lb 7.8 oz (79.6 kg) 164 lb 10.9 oz (74.7 kg)     Physical Exam   General: elderly, frail female in no acute distress Head: Normocephalic, atraumatic.  Neck: Supple without bruits,  Lungs:  Crackles in bases, exam difficult Heart: Irregular rhythm, tachycardic rate Abdomen: Soft, non-tender, non-distended with normoactive bowel sounds. No hepatomegaly. No rebound/guarding. No obvious abdominal masses. Extremities: No clubbing,  cyanosis, trace edema. Distal pedal pulses are 2+ bilaterally. Neuro: awake, answers yes/no questions Psych: Normal affect.  Labs    Chemistry Recent Labs  Lab 09/18/2017 1559 09/15/17 0115 09/16/17 0330 09/17/17 0500 09/18/17 0500  NA 137 138 133* 132* 134*  K 3.8 4.0 4.2 4.2 4.2  CL 105 107 104 102 104  CO2 20* 18* 18* 18* 18*  GLUCOSE 222* 204* 157* 243* 183*  BUN 19 18 40* 58* 73*  CREATININE 0.88 1.02* 2.42* 3.13* 3.03*  CALCIUM 8.8* 7.8* 8.1* 7.9* 8.0*  PROT 6.1* 4.9*  --   --   --   ALBUMIN 3.7 2.7*  --   --   --   AST 26 46*  --   --   --   ALT 27 35  --   --   --   ALKPHOS 65 66  --   --   --   BILITOT 1.9* 1.8*  --   --   --   GFRNONAA 58* 48* 17* 12* 13*  GFRAA >60 56* 20* 14* 15*  ANIONGAP '12 13 11 12 12     '$ Hematology Recent Labs  Lab 09/16/17 0330 09/17/17 0500 09/18/17 0500  WBC 15.5* 15.3* 12.9*  RBC 4.51 4.20 3.94  HGB 13.5 12.7 11.9*  HCT 41.3 38.3 35.7*  MCV  91.6 91.2 90.6  MCH 29.9 30.2 30.2  MCHC 32.7 33.2 33.3  RDW 14.5 14.7 14.6  PLT 220 214 152    Cardiac Enzymes Recent Labs  Lab 09/15/17 0900 09/15/17 1115 09/15/17 1459 09/17/17 0500  TROPONINI 1.68* 2.02* 4.12* 1.05*   No results for input(s): TROPIPOC in the last 168 hours.   BNP Recent Labs  Lab 10/04/2017 1713  BNP 2,708.6*     DDimer  Recent Labs  Lab 09/05/2017 2326  DDIMER 0.81*     Radiology    Dg Chest Port 1 View  Result Date: 09/18/2017 CLINICAL DATA:  Acute respiratory failure EXAM: PORTABLE CHEST 1 VIEW COMPARISON:  09/17/2017 FINDINGS: Endotracheal tube removed.  NG removed. Improvement in bilateral airspace disease. Small bilateral effusions and bibasilar atelectasis similar. IMPRESSION: Improvement in the pulmonary edema. Residual bilateral effusions and bibasilar atelectasis unchanged. Endotracheal tube removed. Electronically Signed   By: Franchot Gallo M.D.   On: 09/18/2017 08:41   Dg Chest Port 1 View  Result Date: 09/17/2017 CLINICAL DATA:   Intubation.  Hypoxia. EXAM: PORTABLE CHEST 1 VIEW COMPARISON:  09/16/2017. FINDINGS: Endotracheal tube and NG tube in stable position. Cardiomegaly with bilateral pulmonary interstitial prominence and bilateral pleural effusions consistent with CHF. IMPRESSION: 1.  Lines and tubes in stable position. 2. Cardiomegaly with diffuse bilateral interstitial prominence and bilateral pleural effusions consistent with CHF. Electronically Signed   By: Marcello Moores  Register   On: 09/17/2017 09:33     Telemetry    Afib 140-160s - Personally Reviewed  ECG    Afib RVR - Personally Reviewed   Cardiac Studies   Echo 09/15/17: Study Conclusions - Left ventricle: The cavity size was normal. Systolic function was severely reduced. The estimated ejection fraction was in the range of 25% to 30%. Diffuse hypokinesis. Doppler parameters are consistent with abnormal left ventricular relaxation (grade 1 diastolic dysfunction). - Ventricular septum: Septal motion showed abnormal function and dyssynergy. The contour showed diastolic flattening. - Aortic valve: Trileaflet; mildly thickened, mildly calcified leaflets. - Mitral valve: There was mild to moderate regurgitation. - Right ventricle: Systolic function was moderately reduced. - Tricuspid valve: There was moderate regurgitation. - Pulmonary arteries: Systolic pressure was moderately increased. PA peak pressure: 50 mm Hg (S). - Pericardium, extracardiac: A trivial pericardial effusion was identified posterior to the heart.  Impressions: - EF is reduced when compared to prior (55%)    Patient Profile     84 y.o. female with a hx of HTN followed by Dr Alain Marion. Sheis being seen today for the evaluation of new LVD and elevated Troponin s/p PEA arrest.  Assessment & Plan    1. PEA arrest Troponin peaked at 4.12 on 09/15/17 at 1500, trended down to 1.05. Creatinine has leveled off, now 3.03 (3.13). Will not pursue ischemic evaluation  while in critical condition. It is suspected that her PEA arrest was respiratory in nature since she did not have an acute EKG on admission with a mildly elevated troponin thought to be related to demand ischemia in the setting of respiratory illness. Continue plavix for now. May consider ischemic evaluation after she recovers from this event.   2. Acute on chronic systolic and diastolic heart failure Echo with newly reduced EF of 25-30% and grade 1 DD, mild to moderate mitral regurgitation. Will follow fluid status closely. Will hold lasix for one more day to give renal function time to improve. Likely start lasix tomorrow.  3. Paroxysmal atrial tachycardia Converted from sinus to Afib RVR at  1015 today in the 140-160s. Primary team started diltiazem drip that is currently going at 15 mg/hr. She continues to have RVR. Amiodarone is a possibility, but may not be the best choice given her acute respiratory issues this admission. Will discuss with attending starting amiodarone bolus with drip. She is tolerating the rhythm for now, denies chest pain, palpitations, and dizziness.  This patients CHA2DS2-VASc Score and unadjusted Ischemic Stroke Rate (% per year) is equal to 7.2 % stroke rate/year from a score of 5 (CHF, HTN, age, female).   Start heparin drip, may stop plavix.   Signed, Tami Lin Duke , PA-C 11:59 AM 09/18/2017 Pager: [phone]   History and all data above reviewed.  Patient examined.  I agree with the findings as above. She says that she feels OK but she went into atrial fib this morning.   She denies pain or SOB.  She does feel her heart pounding.  She did not reduce her HR with Cardizem.   The patient exam reveals [id number]  ,  Lungs: Decreased breath sound   ,  Abd: Positive bowel sounds, no rebound no guarding, Ext Mild thigh edema  .  All available labs, radiology testing, previous records reviewed. Agree with documented assessment and plan. Atrial Fib:  New onset.   Rate not responding to IV Dilt.  Will start IV amiodarone.  Agree with heparin for now.  Stop the Plavix.  Stop Lovenox.  Can stop IV Dilt when amiodarone started.    Jeneen Rinks Annayah Worthley  12:29 PM  09/18/2017

## 2017-09-19 LAB — CBC WITH DIFFERENTIAL/PLATELET
BASOS ABS: 0 10*3/uL (ref 0.0–0.1)
BASOS PCT: 0 %
EOS ABS: 0 10*3/uL (ref 0.0–0.7)
EOS PCT: 0 %
HCT: 36.7 % (ref 36.0–46.0)
Hemoglobin: 12 g/dL (ref 12.0–15.0)
Lymphocytes Relative: 5 %
Lymphs Abs: 0.6 10*3/uL — ABNORMAL LOW (ref 0.7–4.0)
MCH: 29.2 pg (ref 26.0–34.0)
MCHC: 32.7 g/dL (ref 30.0–36.0)
MCV: 89.3 fL (ref 78.0–100.0)
Monocytes Absolute: 1.9 10*3/uL — ABNORMAL HIGH (ref 0.1–1.0)
Monocytes Relative: 15 %
Neutro Abs: 10.5 10*3/uL — ABNORMAL HIGH (ref 1.7–7.7)
Neutrophils Relative %: 80 %
PLATELETS: 155 10*3/uL (ref 150–400)
RBC: 4.11 MIL/uL (ref 3.87–5.11)
RDW: 14.6 % (ref 11.5–15.5)
WBC: 13 10*3/uL — ABNORMAL HIGH (ref 4.0–10.5)

## 2017-09-19 LAB — BASIC METABOLIC PANEL
ANION GAP: 14 (ref 5–15)
BUN: 77 mg/dL — ABNORMAL HIGH (ref 6–20)
CALCIUM: 8.2 mg/dL — AB (ref 8.9–10.3)
CO2: 19 mmol/L — ABNORMAL LOW (ref 22–32)
Chloride: 102 mmol/L (ref 101–111)
Creatinine, Ser: 3.18 mg/dL — ABNORMAL HIGH (ref 0.44–1.00)
GFR, EST AFRICAN AMERICAN: 14 mL/min — AB (ref >60)
GFR, EST NON AFRICAN AMERICAN: 12 mL/min — AB (ref >60)
GLUCOSE: 251 mg/dL — AB (ref 65–99)
Potassium: 4.1 mmol/L (ref 3.5–5.1)
Sodium: 135 mmol/L (ref 135–145)

## 2017-09-19 LAB — CULTURE, BLOOD (ROUTINE X 2)
Culture: NO GROWTH
Culture: NO GROWTH
SPECIAL REQUESTS: ADEQUATE
Special Requests: ADEQUATE

## 2017-09-19 LAB — GLUCOSE, CAPILLARY
GLUCOSE-CAPILLARY: 135 mg/dL — AB (ref 65–99)
Glucose-Capillary: 198 mg/dL — ABNORMAL HIGH (ref 65–99)
Glucose-Capillary: 178 mg/dL — ABNORMAL HIGH (ref 65–99)
Glucose-Capillary: 159 mg/dL — ABNORMAL HIGH (ref 65–99)
Glucose-Capillary: 129 mg/dL — ABNORMAL HIGH (ref 65–99)
Glucose-Capillary: 156 mg/dL — ABNORMAL HIGH (ref 65–99)

## 2017-09-19 LAB — HEPARIN LEVEL (UNFRACTIONATED): Heparin Unfractionated: <0.1 IU/mL — ABNORMAL LOW (ref 0.30–0.70)

## 2017-09-19 MED ORDER — LEVOFLOXACIN IN D5W 500 MG/100ML IV SOLN
500.0000 mg | Freq: Once | INTRAVENOUS | Status: AC
  Administered 2017-09-20: 500 mg via INTRAVENOUS
  Filled 2017-09-19: qty 100

## 2017-09-19 MED ORDER — MORPHINE SULFATE (PF) 4 MG/ML IV SOLN
1.0000 mg | INTRAVENOUS | Status: DC | PRN
  Administered 2017-09-19 – 2017-09-23 (×10): 1 mg via INTRAVENOUS
  Filled 2017-09-19 (×11): qty 1

## 2017-09-19 MED ORDER — LACTATED RINGERS IV SOLN
INTRAVENOUS | Status: DC
  Administered 2017-09-19 – 2017-09-21 (×4): via INTRAVENOUS

## 2017-09-19 MED ORDER — HEPARIN BOLUS VIA INFUSION
2000.0000 [IU] | Freq: Once | INTRAVENOUS | Status: AC
Start: 2017-09-19 — End: 2017-09-19
  Administered 2017-09-19: 2000 [IU] via INTRAVENOUS
  Filled 2017-09-19: qty 2000

## 2017-09-19 NOTE — Progress Notes (Signed)
Pharmacy Antibiotic Note  Sandra [name] is a 84 y.o. female admitted  09/15/2017 with sepsis/CAP. Pharmacy has been following heparin dosing. Five days of therapy for CAP was completed on 09/18/17. Levaquin ordered per pharmacy today to complete a total of 8 days.  Plan: Levaquin 500mg  IV x 1 dose on 09/20/17.  (Dosing would be 500mg  q48h so this will complete 8 days of therapy)  Height: 5\' 2"  (157.5 cm) Weight: 163 lb 12.8 oz (74.3 kg) IBW/kg (Calculated) : 50.1  Temp (24hrs), Avg:98.9 F (37.2 C), Min:98.2 F (36.8 C), Max:99.1 F (37.3 C)  Recent Labs  Lab 09/22/2017 1607 09/17/2017 1720 09/15/17 0115 09/15/17 0246 09/15/17 0551 09/15/17 1115 09/16/17 0330 09/17/17 0500 09/18/17 0500 09/19/17 0344  WBC  --   --  17.9*  --   --   --  15.5* 15.3* 12.9* 13.0*  CREATININE  --   --  1.02*  --   --   --  2.42* 3.13* 3.03* 3.18*  LATICACIDVEN 2.48* 1.92*  --  5.1* 3.3* 1.7  --   --   --   --     Estimated Creatinine Clearance: 12 mL/min (A) (by C-G formula based on SCr of 3.18 mg/dL (H)).    Allergies  Allergen Reactions  . Prednisone Shortness Of Breath and Other (See Comments)    dyspnea  . Aspirin     Upset stomach  . Azithromycin     REACTION: does not work She can take Biaxin  . Enalapril Maleate     REACTION: cough  . Penicillins     Per Family there was some swelling of throat, reaction occurred more than 10 years ago :Did PCNreaction causing immediate rash, facial/tongue/throat swelling, SOB or lightheadedness with hypotension: Yes: Did PCN reaction causing severe rash involving mucus membranes or skin necrosis: No Did a PCN reaction that required hospitalization: Unknown Did a PCN reaction occurring within the last 10 years: No..Marland KitchenMarland KitchenIf all of the above answers are "NO", then may proceed with Cephalosporin  . Trazodone And Nefazodone     Peeing a lot    Antimicrobials this admission:  3/11 Levaquin >>  Dose adjustments this admission:  3/11  Lev 750mg  q48h for  CrCl < 50 3/12  Lev dose to 500mg  q48h  Microbiology results:  3/11 Urine strep Ag: positive 3/11 Influenza A/B: negative 3/11 NFA:OZHY 3/11 UCx: mx spp.  3/12 Respiratory virus panel: neg 3/12 MRSA PCR: negative 3/12 Urine legionella Ag: IP  Thank you for allowing pharmacy to be a part of this patient's care.  Romeo Rabon, PharmD. Mobile: [phone]. 09/19/2017,10:52 AM.

## 2017-09-19 NOTE — Progress Notes (Signed)
ANTICOAGULATION CONSULT NOTE  Pharmacy Consult for Heparin Indication: atrial fibrillation  Patient Measurements: Height: 5\' 2"  (157.5 cm) Weight: 163 lb 12.8 oz (74.3 kg) IBW/kg (Calculated) : 50.1 HEPARIN DW (KG): 62.9  Vital Signs: Temp: 98.2 F (36.8 C) (03/16 0800) BP: 111/67 (03/16 0800) Pulse Rate: 66 (03/16 0800)  Labs: Recent Labs    09/17/17 0500 09/18/17 0500 09/18/17 2151 09/19/17 0344 09/19/17 0831  HGB 12.7 11.9*  --  12.0  --   HCT 38.3 35.7*  --  36.7  --   PLT 214 152  --  155  --   HEPARINUNFRC  --   --  0.12*  --  <0.10*  CREATININE 3.13* 3.03*  --  3.18*  --   TROPONINI 1.05*  --   --   --   --     Estimated Creatinine Clearance: 12 mL/min (A) (by C-G formula based on SCr of 3.18 mg/dL (H)).   Medical History: Past Medical History:  Diagnosis Date  . Anxiety   . Colon polyps   . HTN (hypertension)   . Hyperlipidemia   . LBP (low back pain) 2011   right  . Osteoporosis   . Peripheral neuropathy   . Strabismus    L eye  . Vitamin B12 deficiency   . Vitamin D deficiency     Medications:  Infusions:  . sodium chloride 10 mL/hr at 09/19/17 0600  . amiodarone 30 mg/hr (09/19/17 0600)  . heparin 950 Units/hr (09/19/17 0600)  . lactated ringers 75 mL/hr at 09/19/17 6387    Assessment: 84 yo F s/p PEA arrest, acute respiratory failure secondary to CAP.  Now in Afib.  Pharmacy asked to start IV heparin.  She is not on any anticoagulants PTA.  She has been on Lovenox for VTE px since admission; last dose given 3/14 PM.   09/19/2017:  Heparin level <0.1 on 950 units/hr  CBC- WNL  RN reports no bleeding or infusion related issues. States arm is swollen at IV site but gets blood return, however still change IV site.   AKI noted.   Goal of Therapy:  Heparin level 0.3-0.7 units/ml Monitor platelets by anticoagulation protocol: Yes   Plan:  Re-bolus Heparin 2000 units x1 now then increase heparin infusion to 1150 units/hr Recheck 8h  heparin level Monitor heparin level & CBC daily while on heparin  Daytona Hedman, Lavonia Drafts 09/19/2017,10:46 AM

## 2017-09-19 NOTE — Progress Notes (Signed)
ANTICOAGULATION CONSULT NOTE - Follow Up Consult  Pharmacy Consult for Heparin Indication: atrial fibrillation   Patient Measurements: Height: 5\' 2"  (157.5 cm) Weight: 163 lb 12.8 oz (74.3 kg) IBW/kg (Calculated) : 50.1 Heparin Dosing Weight:   Vital Signs: Temp: 99 F (37.2 C) (03/16 0400) Temp Source: Bladder (03/15 2200) BP: 108/68 (03/16 0400) Pulse Rate: 73 (03/16 0400)  Labs: Recent Labs    09/17/17 0500 09/18/17 0500 09/18/17 2151 09/19/17 0344  HGB 12.7 11.9*  --  12.0  HCT 38.3 35.7*  --  36.7  PLT 214 152  --  155  HEPARINUNFRC  --   --  0.12*  --   CREATININE 3.13* 3.03*  --  3.18*  TROPONINI 1.05*  --   --   --     Estimated Creatinine Clearance: 12 mL/min (A) (by C-G formula based on SCr of 3.18 mg/dL (H)).   Medications:  Infusions:  . sodium chloride 10 mL/hr at 09/19/17 0400  . amiodarone 30 mg/hr (09/19/17 0400)  . dextrose 5 % and 0.45% NaCl 50 mL/hr at 09/19/17 0400  . diltiazem (CARDIZEM) infusion Stopped (09/18/17 1336)  . heparin 950 Units/hr (09/19/17 0400)    Assessment: Patient with heparin level below goal.  No heparin issues noted. Goal of Therapy:  Heparin level 0.3-0.7 units/ml Monitor platelets by anticoagulation protocol: Yes   Plan:  Increase heparin to 950 units/hr Recheck level at 0800  Tyler Deis, Shea Stakes Crowford 09/19/2017,5:59 AM

## 2017-09-19 NOTE — Progress Notes (Signed)
PROGRESS NOTE    Sandra [name]  PPJ:[id number] DOB: 05/17/1929 DOA: 09/08/2017 PCP: Cassandria Anger, MD    Brief Narrative:  84 year old female who presented with dyspnea.  She does have a significant past medical history for hypertension, and dyslipidemia.  She complained of worsening dyspnea for the last 2 weeks prior to hospitalization, progressing into altered mentation.  On her initial physical examination blood pressure 159/87, heart rate 100-120, temperature 99.1, respiratory rate 20 to 30, oxygen saturation 98%.  Lungs with bilateral rales, no wheezing, heart S1-S2 present, tachycardic, abdomen is soft nontender, no lower extremity edema.  Sodium 137, potassium 3.8, chloride 105, bicarb 20, glucose 222, BUN 19, creatinine 0.88, white count 17.1, hemoglobin 14.2, hematocrit 43.3, platelets 242, d-dimer 0.81, urine analysis 150 glucose, pH 5, protein 100, specific gravity 1.020, 0-5 RBCs, 0-5 white cells.  Chest x-ray with left lower lobe infiltrate, positive air bronchograms, bilateral pleural effusions.   Patient was admitted to the hospital with a working diagnosis of sepsis due to community-acquired pneumonia.   Patient developed acute hypoxic respiratory failure, and had a pulseless electrical activity cardiac arrest, required ACLS procedures and was placed on invasive mechanical ventilation.    Assessment & Plan:   Principal Problem:   CAP (community acquired pneumonia) Active Problems:   Sepsis ([place])   Cardiac arrest ([place])   Endotracheally intubated   Atrial fibrillation with RVR ([place])   1.  Acute hypoxic respiratory failure due to community-acquired pneumonia. Streptococcal pneumonia/ present on admission, complicated by sepsis/status post cardiac arrest PEA. Oxygenating well on nasal cannula, up to 95% on 3 LPM flow, dyspnea has improved. Antibiotic therapy with levofloxacin IV #3 (penicillin allergy). Patient has been afebrile, wbc at 13.   2.  Acute kidney  injury. Worsening renal function with serum cr at 3.18 with K at 4,1 and serum bicarbonate at 19. Clinically dry, will increase rate of IV fluids to 75 ml per hour, with lactate ringers, balanced electrolyte solution. Urine output over last 24 hours 550 ml.   3. New systolic heart failure complicated with new onset atrial fibrillation. Patient initially on diltiazem infusion, then changed to amiodarone, patient now on sinus rhythm. Will continue telemetry monitoring and anticoagulation with heparin. Echocardiogram with systolic LV function severely depressed with EF 25 to 30%. With diffuse hypokinesis. Diastolic flattening septum with moderately reduced RV systolic function. Peak PA pressures 50 mmHg.   3.  Hypertension. Blood pressure 58-099 systolic, will increase rate of IV fluids, and continue close monitoring. Continue holding amlodipine, atenolol, clonidine  4. Depression. On trazodone.   5. T2DM. Will continue insulin sliding scale for glucose cover and monitoring. Will discontinue IV dextrose. Fasting glucose 222.   6. Swallow dysfunction. Will resume feedings with dysphagia 3 protocol, with aspiration precautions.    DVT prophylaxis: enoxaparin  Code Status:  dnr Family Communication: no family at the bedside Disposition Plan: home when clinically improved   Consultants:     Procedures:     Antimicrobials:   Ceftriaxone.     Subjective: Patient is feeling better, dyspnea continue to improve. Yesterday developed atrial fibrilltion with RVR.   Objective: Vitals:   09/19/17 0300 09/19/17 0400 09/19/17 0500 09/19/17 0600  BP: 122/73 108/68 (!) 113/59 (!) 105/58  Pulse: 73 73 66 66  Resp: (!) 25 (!) 22 (!) 21 (!) 21  Temp: 99 F (37.2 C) 99 F (37.2 C) 99 F (37.2 C) 98.8 F (37.1 C)  TempSrc:      SpO2: 98%  98% 98% 97%  Weight:      Height:        Intake/Output Summary (Last 24 hours) at 09/19/2017 0758 Last data filed at 09/19/2017 0600 Gross per  24 hour  Intake 1780.28 ml  Output 550 ml  Net 1230.28 ml   Filed Weights   09/17/17 0500 09/18/17 0300 09/19/17 0230  Weight: 79.6 kg (175 lb 7.8 oz) 74.7 kg (164 lb 10.9 oz) 74.3 kg (163 lb 12.8 oz)    Examination:   General: deconditioned and ill looking appearing Neurology: Awake and alert, non focal  E ENT: mild pallor, no icterus, oral mucosa dry Cardiovascular: No JVD. S1-S2 present, rhythmic, no gallops, rubs, or murmurs. No lower extremity edema. Pulmonary: decreased breath sounds bilaterally at bases, adequate air movement, no wheezing, rhonchi or rales. Gastrointestinal. Abdomen no organomegaly, non tender, no rebound or guarding Skin. No rashes Musculoskeletal: no joint deformities     Data Reviewed: I have personally reviewed following labs and imaging studies  CBC: Recent Labs  Lab 09/27/2017 1559 09/15/17 0115 09/16/17 0330 09/17/17 0500 09/18/17 0500 09/19/17 0344  WBC 17.1* 17.9* 15.5* 15.3* 12.9* 13.0*  NEUTROABS 15.0*  --  12.6*  --   --  10.5*  HGB 14.2 13.4 13.5 12.7 11.9* 12.0  HCT 43.4 42.7 41.3 38.3 35.7* 36.7  MCV 93.3 94.7 91.6 91.2 90.6 89.3  PLT 242 160 220 214 152 470   Basic Metabolic Panel: Recent Labs  Lab 09/15/17 0115 09/16/17 0330 09/17/17 0500 09/18/17 0500 09/19/17 0344  NA 138 133* 132* 134* 135  K 4.0 4.2 4.2 4.2 4.1  CL 107 104 102 104 102  CO2 18* 18* 18* 18* 19*  GLUCOSE 204* 157* 243* 183* 251*  BUN 18 40* 58* 73* 77*  CREATININE 1.02* 2.42* 3.13* 3.03* 3.18*  CALCIUM 7.8* 8.1* 7.9* 8.0* 8.2*  MG  --  1.6* 1.5*  --   --   PHOS  --  4.7* 5.5*  --   --    GFR: Estimated Creatinine Clearance: 12 mL/min (A) (by C-G formula based on SCr of 3.18 mg/dL (H)). Liver Function Tests: Recent Labs  Lab 09/20/2017 1559 09/15/17 0115  AST 26 46*  ALT 27 35  ALKPHOS 65 66  BILITOT 1.9* 1.8*  PROT 6.1* 4.9*  ALBUMIN 3.7 2.7*   No results for input(s): LIPASE, AMYLASE in the last 168 hours. No results for input(s):  AMMONIA in the last 168 hours. Coagulation Profile: No results for input(s): INR, PROTIME in the last 168 hours. Cardiac Enzymes: Recent Labs  Lab 09/15/17 0334 09/15/17 0900 09/15/17 1115 09/15/17 1459 09/17/17 0500  TROPONINI 0.62* 1.68* 2.02* 4.12* 1.05*   BNP (last 3 results) No results for input(s): PROBNP in the last 8760 hours. HbA1C: No results for input(s): HGBA1C in the last 72 hours. CBG: Recent Labs  Lab 09/18/17 1637 09/18/17 1939 09/18/17 2305 09/19/17 0316 09/19/17 0733  GLUCAP 231* 231* 254* 198* 178*   Lipid Profile: No results for input(s): CHOL, HDL, LDLCALC, TRIG, CHOLHDL, LDLDIRECT in the last 72 hours. Thyroid Function Tests: No results for input(s): TSH, T4TOTAL, FREET4, T3FREE, THYROIDAB in the last 72 hours. Anemia Panel: No results for input(s): VITAMINB12, FOLATE, FERRITIN, TIBC, IRON, RETICCTPCT in the last 72 hours.    Radiology Studies: I have reviewed all of the imaging during this hospital visit personally     Scheduled Meds: . chlorhexidine gluconate (MEDLINE KIT)  15 mL Mouth Rinse BID  . cholecalciferol  1,000 Units  Oral Daily  . insulin aspart  1-3 Units Subcutaneous Q4H  . mouth rinse  15 mL Mouth Rinse QID  . traZODone  150 mg Oral QHS   Continuous Infusions: . sodium chloride 10 mL/hr at 09/19/17 0600  . amiodarone 30 mg/hr (09/19/17 0600)  . dextrose 5 % and 0.45% NaCl 50 mL/hr at 09/19/17 0600  . heparin 950 Units/hr (09/19/17 0600)     LOS: 5 days        Janaisha Tolsma Gerome Apley, MD [facility] Pager [phone]

## 2017-09-19 NOTE — Progress Notes (Signed)
Patient clinically has deteriorated, worsening encephalopathy, patient is less reactive.  Patient's family at the bedside explained to them patient's condition and poor prognosis, they have decided to continue medical therapy without escalating meassures, consult palliative care team.  Will order morphine as needed for pain or respiratory distress.  Positive genital bleeding, will hold on heparin drip.   Advanced directives with DNR.

## 2017-09-19 NOTE — Progress Notes (Addendum)
Progress Note  Patient Name: SAHVANNAH RIESER Date of Encounter: 09/19/2017  Primary Cardiologist:   No primary care provider on file.   Subjective   She denies SOB. No pain.   Inpatient Medications    Scheduled Meds: . chlorhexidine gluconate (MEDLINE KIT)  15 mL Mouth Rinse BID  . cholecalciferol  1,000 Units Oral Daily  . insulin aspart  1-3 Units Subcutaneous Q4H  . mouth rinse  15 mL Mouth Rinse QID  . traZODone  150 mg Oral QHS   Continuous Infusions: . sodium chloride 10 mL/hr at 09/19/17 0600  . amiodarone 30 mg/hr (09/19/17 0600)  . dextrose 5 % and 0.45% NaCl 50 mL/hr at 09/19/17 0600  . diltiazem (CARDIZEM) infusion Stopped (09/18/17 1336)  . heparin 950 Units/hr (09/19/17 0600)   PRN Meds: Place/Maintain arterial line **AND** sodium chloride, docusate, traMADol   Vital Signs    Vitals:   09/19/17 0300 09/19/17 0400 09/19/17 0500 09/19/17 0600  BP: 122/73 108/68 (!) 113/59 (!) 105/58  Pulse: 73 73 66 66  Resp: (!) 25 (!) 22 (!) 21 (!) 21  Temp: 99 F (37.2 C) 99 F (37.2 C) 99 F (37.2 C) 98.8 F (37.1 C)  TempSrc:      SpO2: 98% 98% 98% 97%  Weight:      Height:        Intake/Output Summary (Last 24 hours) at 09/19/2017 0748 Last data filed at 09/19/2017 0600 Gross per 24 hour  Intake 1780.28 ml  Output 550 ml  Net 1230.28 ml   Filed Weights   09/17/17 0500 09/18/17 0300 09/19/17 0230  Weight: 175 lb 7.8 oz (79.6 kg) 164 lb 10.9 oz (74.7 kg) 163 lb 12.8 oz (74.3 kg)    Telemetry    NSR, PAT - Personally Reviewed  ECG    NA - Personally Reviewed  Physical Exam   GEN: No acute distress.  Chronically ill and weak Neck: No  JVD Cardiac: RRR, no murmurs, rubs, or gallops.  Respiratory: Decreased breath sounds but improved from yesterday.  GI: Soft, nontender, non-distended  MS: Mild edema; No deformity. Neuro:  Nonfocal  Psych: Normal affect   Labs    Chemistry Recent Labs  Lab 10/03/2017 1559 09/15/17 0115  09/17/17 0500  09/18/17 0500 09/19/17 0344  NA 137 138   < > 132* 134* 135  K 3.8 4.0   < > 4.2 4.2 4.1  CL 105 107   < > 102 104 102  CO2 20* 18*   < > 18* 18* 19*  GLUCOSE 222* 204*   < > 243* 183* 251*  BUN 19 18   < > 58* 73* 77*  CREATININE 0.88 1.02*   < > 3.13* 3.03* 3.18*  CALCIUM 8.8* 7.8*   < > 7.9* 8.0* 8.2*  PROT 6.1* 4.9*  --   --   --   --   ALBUMIN 3.7 2.7*  --   --   --   --   AST 26 46*  --   --   --   --   ALT 27 35  --   --   --   --   ALKPHOS 65 66  --   --   --   --   BILITOT 1.9* 1.8*  --   --   --   --   GFRNONAA 58* 48*   < > 12* 13* 12*  GFRAA >60 56*   < > 14* 15* 14*  ANIONGAP 12 13   < > '12 12 14   '$ < > = values in this interval not displayed.     Hematology Recent Labs  Lab 09/17/17 0500 09/18/17 0500 09/19/17 0344  WBC 15.3* 12.9* 13.0*  RBC 4.20 3.94 4.11  HGB 12.7 11.9* 12.0  HCT 38.3 35.7* 36.7  MCV 91.2 90.6 89.3  MCH 30.2 30.2 29.2  MCHC 33.2 33.3 32.7  RDW 14.7 14.6 14.6  PLT 214 152 155    Cardiac Enzymes Recent Labs  Lab 09/15/17 0900 09/15/17 1115 09/15/17 1459 09/17/17 0500  TROPONINI 1.68* 2.02* 4.12* 1.05*   No results for input(s): TROPIPOC in the last 168 hours.   BNP Recent Labs  Lab 09/18/2017 1713  BNP 2,708.6*     DDimer  Recent Labs  Lab 09/20/2017 2326  DDIMER 0.81*     Radiology    Dg Chest Port 1 View  Result Date: 09/18/2017 CLINICAL DATA:  Acute respiratory failure EXAM: PORTABLE CHEST 1 VIEW COMPARISON:  09/17/2017 FINDINGS: Endotracheal tube removed.  NG removed. Improvement in bilateral airspace disease. Small bilateral effusions and bibasilar atelectasis similar. IMPRESSION: Improvement in the pulmonary edema. Residual bilateral effusions and bibasilar atelectasis unchanged. Endotracheal tube removed. Electronically Signed   By: Franchot Gallo M.D.   On: 09/18/2017 08:41   Dg Swallowing Func-speech Pathology  Result Date: 09/18/2017 Objective Swallowing Evaluation: Type of Study: MBS-Modified Barium Swallow  Study  Patient Details Name: AMIRE GOSSEN MRN: [id number] Date of Birth: [birth year] Today's Date: 09/18/2017 Time: SLP Start Time (ACUTE ONLY): 1545 -SLP Stop Time (ACUTE ONLY): 1610 SLP Time Calculation (min) (ACUTE ONLY): 25 min Past Medical History: Past Medical History: Diagnosis Date . Anxiety  . Colon polyps  . HTN (hypertension)  . Hyperlipidemia  . LBP (low back pain) 2011  right . Osteoporosis  . Peripheral neuropathy  . Strabismus   L eye . Vitamin B12 deficiency  . Vitamin D deficiency  Past Surgical History: Past Surgical History: Procedure Laterality Date . ABDOMINAL HYSTERECTOMY   HPI: 84 yo female adm to [facility] with respiratory deficits- she had PEA and required CPR, intubation.   Pt CXR 09/17/2017 showed bilateral interstitial prominence with bilateral effusions c/w CHF.  Pt has h/o cervical dysphagia- with findings of esophageal spasm with tertiary contractions and siliding hiatal hernia- 04/2002.      Subjective: pt awake in chair, now with improved phonation Assessment / Plan / Recommendation CHL IP CLINICAL IMPRESSIONS 09/18/2017 Clinical Impression Pt with much improved phonatory ability by the time MBS completed.  NO aspiration observed and mild flash (deep) penetration of thin noted that adequately cleared.  Minimal delay in oral coordination/transiting with pill and liquid primarily.  Pt had overall strong swallow without severe residuals. She did become mildly short of breath and with increased HR during MBS, therefore rec dys3/thin diet with aspiration and esophageal precautions.  SLP Visit Diagnosis Dysphagia, oropharyngeal phase (R13.12) Attention and concentration deficit following -- Frontal lobe and executive function deficit following -- Impact on safety and function Mild aspiration risk   CHL IP TREATMENT RECOMMENDATION 09/18/2017 Treatment Recommendations Therapy as outlined in treatment plan below   Prognosis 09/18/2017 Prognosis for Safe Diet Advancement Good Barriers to Reach Goals --  Barriers/Prognosis Comment -- CHL IP DIET RECOMMENDATION 09/18/2017 SLP Diet Recommendations Dysphagia 3 (Mech soft) solids;Thin liquid Liquid Administration via Cup Medication Administration Whole meds with puree Compensations Minimize environmental distractions;Slow rate;Small sips/bites Postural Changes Seated upright at 90 degrees;Remain semi-upright after after feeds/meals (Comment)  CHL IP OTHER RECOMMENDATIONS 09/18/2017 Recommended Consults -- Oral Care Recommendations Oral care BID Other Recommendations --   CHL IP FOLLOW UP RECOMMENDATIONS 09/18/2017 Follow up Recommendations Skilled Nursing facility   [facility] IP FREQUENCY AND DURATION 09/18/2017 Speech Therapy Frequency (ACUTE ONLY) min 1 x/week Treatment Duration 1 week      CHL IP ORAL PHASE 09/18/2017 Oral Phase Impaired Oral - Pudding Teaspoon -- Oral - Pudding Cup -- Oral - Honey Teaspoon -- Oral - Honey Cup -- Oral - Nectar Teaspoon Premature spillage Oral - Nectar Cup Premature spillage Oral - Nectar Straw -- Oral - Thin Teaspoon Premature spillage Oral - Thin Cup Premature spillage Oral - Thin Straw Premature spillage Oral - Puree Premature spillage;WFL Oral - Mech Soft -- Oral - Regular Premature spillage;WFL Oral - Multi-Consistency -- Oral - Pill Premature spillage Oral Phase - Comment --  CHL IP PHARYNGEAL PHASE 09/18/2017 Pharyngeal Phase Impaired Pharyngeal- Pudding Teaspoon -- Pharyngeal -- Pharyngeal- Pudding Cup -- Pharyngeal -- Pharyngeal- Honey Teaspoon -- Pharyngeal -- Pharyngeal- Honey Cup -- Pharyngeal -- Pharyngeal- Nectar Teaspoon WFL Pharyngeal -- Pharyngeal- Nectar Cup WFL Pharyngeal -- Pharyngeal- Nectar Straw -- Pharyngeal -- Pharyngeal- Thin Teaspoon WFL Pharyngeal -- Pharyngeal- Thin Cup Penetration/Aspiration during swallow Pharyngeal Material enters airway, remains ABOVE vocal cords then ejected out Pharyngeal- Thin Straw Penetration/Aspiration during swallow Pharyngeal Material enters airway, remains ABOVE vocal cords then ejected  out Pharyngeal- Puree WFL Pharyngeal -- Pharyngeal- Mechanical Soft -- Pharyngeal -- Pharyngeal- Regular WFL Pharyngeal -- Pharyngeal- Multi-consistency -- Pharyngeal -- Pharyngeal- Pill WFL Pharyngeal -- Pharyngeal Comment --  CHL IP CERVICAL ESOPHAGEAL PHASE 09/18/2017 Cervical Esophageal Phase Impaired Pudding Teaspoon -- Pudding Cup -- Honey Teaspoon -- Honey Cup -- Nectar Teaspoon -- Nectar Cup -- Nectar Straw -- Thin Teaspoon -- Thin Cup -- Thin Straw -- Puree -- Mechanical Soft -- Regular -- Multi-consistency -- Pill -- Cervical Esophageal Comment barium tablet taken with thin appeared to lodge at distal esophagus without pt awareness, pudding bolus transited tablet into stomach Luanna Salk, MS [facility] SLP [phone] No flowsheet data found. Macario Golds 09/18/2017, 4:29 PM               Cardiac Studies   Echo 09/15/17: Study Conclusions - Left ventricle: The cavity size was normal. Systolic function was severely reduced. The estimated ejection fraction was in the range of 25% to 30%. Diffuse hypokinesis. Doppler parameters are consistent with abnormal left ventricular relaxation (grade 1 diastolic dysfunction). - Ventricular septum: Septal motion showed abnormal function and dyssynergy. The contour showed diastolic flattening. - Aortic valve: Trileaflet; mildly thickened, mildly calcified leaflets. - Mitral valve: There was mild to moderate regurgitation. - Right ventricle: Systolic function was moderately reduced. - Tricuspid valve: There was moderate regurgitation. - Pulmonary arteries: Systolic pressure was moderately increased. PA peak pressure: 50 mm Hg (S). - Pericardium, extracardiac: A trivial pericardial effusion was identified posterior to the heart.  Impressions: - EF is reduced when compared to prior (55%)    Patient Profile     84 y.o. female with a hx of HTN followed by Dr Alain Marion. Sheis being seen today for the evaluation of new LVD and  elevated Troponin s/p PEA arrest.  Found to have new cardiomyopathy.   Assessment & Plan    PEA ARREST:   Elevated troponin.  Trended down.  Medical management.   Unable to titrate any meds with AKI and hypotension. Restart Plavix when taking POs.  .    ATRIAL FIB:  Converted back to  NSR.  Can change to 400 mg PO bid amiodarone when taking POs.  Continue IV and heparin for now.  Will likely discontinue heparin if she stays in NSR.   AKI:  Creat continues to increase. Follow.  Avoiding ACE inhibitors and ARBs.  For questions or updates, please contact [place] Please consult [email or web address] for contact info under Cardiology/STEMI.   Signed, Minus Breeding, MD  09/19/2017, 7:48 AM

## 2017-09-20 DIAGNOSIS — G9341 Metabolic encephalopathy: Secondary | ICD-10-CM

## 2017-09-20 LAB — MAGNESIUM: MAGNESIUM: 2 mg/dL (ref 1.7–2.4)

## 2017-09-20 LAB — CBC
HCT: 38.5 % (ref 36.0–46.0)
HEMOGLOBIN: 12.8 g/dL (ref 12.0–15.0)
MCH: 29.5 pg (ref 26.0–34.0)
MCHC: 33.2 g/dL (ref 30.0–36.0)
MCV: 88.7 fL (ref 78.0–100.0)
PLATELETS: 177 10*3/uL (ref 150–400)
RBC: 4.34 MIL/uL (ref 3.87–5.11)
RDW: 14.6 % (ref 11.5–15.5)
WBC: 13.5 10*3/uL — AB (ref 4.0–10.5)

## 2017-09-20 LAB — BASIC METABOLIC PANEL
ANION GAP: 13 (ref 5–15)
BUN: 89 mg/dL — ABNORMAL HIGH (ref 6–20)
CALCIUM: 8.5 mg/dL — AB (ref 8.9–10.3)
CO2: 17 mmol/L — ABNORMAL LOW (ref 22–32)
Chloride: 104 mmol/L (ref 101–111)
Creatinine, Ser: 3.23 mg/dL — ABNORMAL HIGH (ref 0.44–1.00)
GFR, EST AFRICAN AMERICAN: 14 mL/min — AB (ref >60)
GFR, EST NON AFRICAN AMERICAN: 12 mL/min — AB (ref >60)
Glucose, Bld: 146 mg/dL — ABNORMAL HIGH (ref 65–99)
POTASSIUM: 4.5 mmol/L (ref 3.5–5.1)
SODIUM: 134 mmol/L — AB (ref 135–145)

## 2017-09-20 LAB — GLUCOSE, CAPILLARY
GLUCOSE-CAPILLARY: 139 mg/dL — AB (ref 65–99)
GLUCOSE-CAPILLARY: 148 mg/dL — AB (ref 65–99)
GLUCOSE-CAPILLARY: 146 mg/dL — AB (ref 65–99)
GLUCOSE-CAPILLARY: 151 mg/dL — AB (ref 65–99)
GLUCOSE-CAPILLARY: 167 mg/dL — AB (ref 65–99)

## 2017-09-20 LAB — SODIUM, URINE, RANDOM: Sodium, Ur: <10 mmol/L

## 2017-09-20 MED ORDER — ORAL CARE MOUTH RINSE
15.0000 mL | Freq: Two times a day (BID) | OROMUCOSAL | Status: DC
  Administered 2017-09-20 – 2017-09-22 (×6): 15 mL via OROMUCOSAL

## 2017-09-20 NOTE — Progress Notes (Signed)
Progress Note  Patient Name: Sandra [name] Date of Encounter: 09/20/2017  Primary Cardiologist:   No primary care provider on file.   Subjective   Overall decline noted.  She is weaker but she does not report pain or SOB.   Inpatient Medications    Scheduled Meds: . chlorhexidine gluconate (MEDLINE KIT)  15 mL Mouth Rinse BID  . cholecalciferol  1,000 Units Oral Daily  . insulin aspart  1-3 Units Subcutaneous Q4H  . mouth rinse  15 mL Mouth Rinse QID  . traZODone  150 mg Oral QHS   Continuous Infusions: . sodium chloride 10 mL/hr at 09/20/17 0600  . amiodarone 30 mg/hr (09/20/17 0600)  . lactated ringers 75 mL/hr at 09/20/17 0600  . levofloxacin (LEVAQUIN) IV     PRN Meds: Place/Maintain arterial line **AND** sodium chloride, docusate, morphine injection, traMADol   Vital Signs    Vitals:   09/20/17 0200 09/20/17 0309 09/20/17 0700 09/20/17 0800  BP: 113/60  (!) 141/73 (!) 148/71  Pulse: 64  64 65  Resp: (!) 21  (!) 21 (!) 22  Temp: 98.4 F (36.9 C)  98.1 F (36.7 C) 98.1 F (36.7 C)  TempSrc:    Core  SpO2: 95%  96% 95%  Weight:  171 lb 1.2 oz (77.6 kg)    Height:        Intake/Output Summary (Last 24 hours) at 09/20/2017 0830 Last data filed at 09/20/2017 0600 Gross per 24 hour  Intake 2384.18 ml  Output 300 ml  Net 2084.18 ml   Filed Weights   09/18/17 0300 09/19/17 0230 09/20/17 0309  Weight: 164 lb 10.9 oz (74.7 kg) 163 lb 12.8 oz (74.3 kg) 171 lb 1.2 oz (77.6 kg)    Telemetry    NSR - Personally Reviewed  ECG    NA - Personally Reviewed  Physical Exam   GEN: No  acute distress.   Neck: No  JVD Cardiac: RRR, systolic murmur at the apex, no diastolic murmurs, rubs, or gallops.  Respiratory:   Decreased breat sounds with basilar crackes GI: Soft, nontender, non-distended, normal bowel sounds  MS:  Diffuse edema; No deformity. Neuro:   Nonfocal  Psych: Oriented and appropriate   Labs    Chemistry Recent Labs  Lab 09/30/2017 1559  09/15/17 0115  09/18/17 0500 09/19/17 0344 09/20/17 0328  NA 137 138   < > 134* 135 134*  K 3.8 4.0   < > 4.2 4.1 4.5  CL 105 107   < > 104 102 104  CO2 20* 18*   < > 18* 19* 17*  GLUCOSE 222* 204*   < > 183* 251* 146*  BUN 19 18   < > 73* 77* 89*  CREATININE 0.88 1.02*   < > 3.03* 3.18* 3.23*  CALCIUM 8.8* 7.8*   < > 8.0* 8.2* 8.5*  PROT 6.1* 4.9*  --   --   --   --   ALBUMIN 3.7 2.7*  --   --   --   --   AST 26 46*  --   --   --   --   ALT 27 35  --   --   --   --   ALKPHOS 65 66  --   --   --   --   BILITOT 1.9* 1.8*  --   --   --   --   GFRNONAA 58* 48*   < > 13* 12* 12*  GFRAA >  60 56*   < > 15* 14* 14*  ANIONGAP 12 13   < > '12 14 13   '$ < > = values in this interval not displayed.     Hematology Recent Labs  Lab 09/18/17 0500 09/19/17 0344 09/20/17 0328  WBC 12.9* 13.0* 13.5*  RBC 3.94 4.11 4.34  HGB 11.9* 12.0 12.8  HCT 35.7* 36.7 38.5  MCV 90.6 89.3 88.7  MCH 30.2 29.2 29.5  MCHC 33.3 32.7 33.2  RDW 14.6 14.6 14.6  PLT 152 155 177    Cardiac Enzymes Recent Labs  Lab 09/15/17 0900 09/15/17 1115 09/15/17 1459 09/17/17 0500  TROPONINI 1.68* 2.02* 4.12* 1.05*   No results for input(s): TROPIPOC in the last 168 hours.   BNP Recent Labs  Lab 09/13/2017 1713  BNP 2,708.6*     DDimer  Recent Labs  Lab 10/01/2017 2326  DDIMER 0.81*     Radiology    Dg Swallowing Func-speech Pathology  Result Date: 09/18/2017 Objective Swallowing Evaluation: Type of Study: MBS-Modified Barium Swallow Study  Patient Details Name: Sandra [name] MRN: [id number] Date of Birth: 05-31-1929 Today's Date: 09/18/2017 Time: SLP Start Time (ACUTE ONLY): 1545 -SLP Stop Time (ACUTE ONLY): 1610 SLP Time Calculation (min) (ACUTE ONLY): 25 min Past Medical History: Past Medical History: Diagnosis Date . Anxiety  . Colon polyps  . HTN (hypertension)  . Hyperlipidemia  . LBP (low back pain) 2011  right . Osteoporosis  . Peripheral neuropathy  . Strabismus   L eye . Vitamin B12 deficiency  .  Vitamin D deficiency  Past Surgical History: Past Surgical History: Procedure Laterality Date . ABDOMINAL HYSTERECTOMY   HPI: 84 yo female adm to [facility] with respiratory deficits- she had PEA and required CPR, intubation.   Pt CXR 09/17/2017 showed bilateral interstitial prominence with bilateral effusions c/w CHF.  Pt has h/o cervical dysphagia- with findings of esophageal spasm with tertiary contractions and siliding hiatal hernia- 04/2002.      Subjective: pt awake in chair, now with improved phonation Assessment / Plan / Recommendation CHL IP CLINICAL IMPRESSIONS 09/18/2017 Clinical Impression Pt with much improved phonatory ability by the time MBS completed.  NO aspiration observed and mild flash (deep) penetration of thin noted that adequately cleared.  Minimal delay in oral coordination/transiting with pill and liquid primarily.  Pt had overall strong swallow without severe residuals. She did become mildly short of breath and with increased HR during MBS, therefore rec dys3/thin diet with aspiration and esophageal precautions.  SLP Visit Diagnosis Dysphagia, oropharyngeal phase (R13.12) Attention and concentration deficit following -- Frontal lobe and executive function deficit following -- Impact on safety and function Mild aspiration risk   CHL IP TREATMENT RECOMMENDATION 09/18/2017 Treatment Recommendations Therapy as outlined in treatment plan below   Prognosis 09/18/2017 Prognosis for Safe Diet Advancement Good Barriers to Reach Goals -- Barriers/Prognosis Comment -- CHL IP DIET RECOMMENDATION 09/18/2017 SLP Diet Recommendations Dysphagia 3 (Mech soft) solids;Thin liquid Liquid Administration via Cup Medication Administration Whole meds with puree Compensations Minimize environmental distractions;Slow rate;Small sips/bites Postural Changes Seated upright at 90 degrees;Remain semi-upright after after feeds/meals (Comment)   CHL IP OTHER RECOMMENDATIONS 09/18/2017 Recommended Consults -- Oral Care Recommendations  Oral care BID Other Recommendations --   CHL IP FOLLOW UP RECOMMENDATIONS 09/18/2017 Follow up Recommendations Skilled Nursing facility   [facility] IP FREQUENCY AND DURATION 09/18/2017 Speech Therapy Frequency (ACUTE ONLY) min 1 x/week Treatment Duration 1 week      CHL IP ORAL PHASE 09/18/2017 Oral Phase  Impaired Oral - Pudding Teaspoon -- Oral - Pudding Cup -- Oral - Honey Teaspoon -- Oral - Honey Cup -- Oral - Nectar Teaspoon Premature spillage Oral - Nectar Cup Premature spillage Oral - Nectar Straw -- Oral - Thin Teaspoon Premature spillage Oral - Thin Cup Premature spillage Oral - Thin Straw Premature spillage Oral - Puree Premature spillage;WFL Oral - Mech Soft -- Oral - Regular Premature spillage;WFL Oral - Multi-Consistency -- Oral - Pill Premature spillage Oral Phase - Comment --  CHL IP PHARYNGEAL PHASE 09/18/2017 Pharyngeal Phase Impaired Pharyngeal- Pudding Teaspoon -- Pharyngeal -- Pharyngeal- Pudding Cup -- Pharyngeal -- Pharyngeal- Honey Teaspoon -- Pharyngeal -- Pharyngeal- Honey Cup -- Pharyngeal -- Pharyngeal- Nectar Teaspoon WFL Pharyngeal -- Pharyngeal- Nectar Cup WFL Pharyngeal -- Pharyngeal- Nectar Straw -- Pharyngeal -- Pharyngeal- Thin Teaspoon WFL Pharyngeal -- Pharyngeal- Thin Cup Penetration/Aspiration during swallow Pharyngeal Material enters airway, remains ABOVE vocal cords then ejected out Pharyngeal- Thin Straw Penetration/Aspiration during swallow Pharyngeal Material enters airway, remains ABOVE vocal cords then ejected out Pharyngeal- Puree WFL Pharyngeal -- Pharyngeal- Mechanical Soft -- Pharyngeal -- Pharyngeal- Regular WFL Pharyngeal -- Pharyngeal- Multi-consistency -- Pharyngeal -- Pharyngeal- Pill WFL Pharyngeal -- Pharyngeal Comment --  CHL IP CERVICAL ESOPHAGEAL PHASE 09/18/2017 Cervical Esophageal Phase Impaired Pudding Teaspoon -- Pudding Cup -- Honey Teaspoon -- Honey Cup -- Nectar Teaspoon -- Nectar Cup -- Nectar Straw -- Thin Teaspoon -- Thin Cup -- Thin Straw -- Puree --  Mechanical Soft -- Regular -- Multi-consistency -- Pill -- Cervical Esophageal Comment barium tablet taken with thin appeared to lodge at distal esophagus without pt awareness, pudding bolus transited tablet into stomach Luanna Salk, MS [facility] SLP [phone] No flowsheet data found. Macario Golds 09/18/2017, 4:29 PM               Cardiac Studies   Echo 09/15/17: Study Conclusions - Left ventricle: The cavity size was normal. Systolic function was severely reduced. The estimated ejection fraction was in the range of 25% to 30%. Diffuse hypokinesis. Doppler parameters are consistent with abnormal left ventricular relaxation (grade 1 diastolic dysfunction). - Ventricular septum: Septal motion showed abnormal function and dyssynergy. The contour showed diastolic flattening. - Aortic valve: Trileaflet; mildly thickened, mildly calcified leaflets. - Mitral valve: There was mild to moderate regurgitation. - Right ventricle: Systolic function was moderately reduced. - Tricuspid valve: There was moderate regurgitation. - Pulmonary arteries: Systolic pressure was moderately increased. PA peak pressure: 50 mm Hg (S). - Pericardium, extracardiac: A trivial pericardial effusion was identified posterior to the heart.  Impressions: - EF is reduced when compared to prior (55%)    Patient Profile     84 y.o. female with a hx of HTN followed by Dr Alain Marion. Sheis being seen today for the evaluation of new LVD and elevated Troponin s/p PEA arrest.  Found to have new cardiomyopathy.   Assessment & Plan    PEA ARREST:    Overall decline.  Still not taking PO medicines.  If she resumes this I would restart Plavix   Elevated troponin.  Trended down.  Medical management.   Unable to titrate any meds with AKI and hypotension. Restart Plavix when taking POs.  .    ATRIAL FIB:  She has maintained NSR.  Not able to take PO amiodarone.  I will stop the IV. Heparin off with bleeding  apparent vaginal bleeding.   AKI:   Creat continues to increase slowly.  Plan for palliative involvement.    HTN:  BP is  creeping up.  However, it is labile and she is not taking POs.  I would not suggest any IV therapy.   For questions or updates, please contact [place] Please consult [email or web address] for contact info under Cardiology/STEMI.   Signed, Minus Breeding, MD  09/20/2017, 8:30 AM

## 2017-09-20 NOTE — Progress Notes (Signed)
PROGRESS NOTE    Sandra [name]  ZOX:[id number] DOB: 1928-07-26 DOA: 09/16/2017 PCP: Cassandria Anger, MD    Brief Narrative:  84 year old female who presented with dyspnea. She does have a significant past medical history for hypertension, anddyslipidemia.She complained of worsening dyspnea for the last 2 weeks prior to hospitalization, progressing into altered mentation.On her initial physical examination blood pressure 159/87, heart rate 100-120, temperature 99.1, respiratory rate 20to 30, oxygen saturation 98%.Lungs with bilateral rales, no wheezing, heart S1-S2 present, tachycardic, abdomen is soft nontender, no lower extremity edema. Sodium 137, potassium 3.8, chloride 105, bicarb 20, glucose 222, BUN 19, creatinine 0.88,white count 17.1, hemoglobin 14.2, hematocrit 43.3, platelets 242,d-dimer 0.81,urine analysis 150 glucose, pH 5, protein 100, specific gravity 1.020, 0-5 RBCs, 0-5 white cells.Chest x-ray with left lower lobe infiltrate, positive air bronchograms,bilateral pleural effusions.  Patient was admitted to the hospital with a working diagnosis of sepsis due to community-acquired pneumonia.  Patient developed acute hypoxic respiratory failure,andhad a pulseless electrical activity cardiac arrest,required ACLS procedures and was placed on invasive mechanical ventilation.   Assessment & Plan:   Principal Problem:   CAP (community acquired pneumonia) Active Problems:   Sepsis ([place])   Cardiac arrest ([place])   Endotracheally intubated   Atrial fibrillation with RVR ([place])    1.Acute hypoxic respiratory failure due to community-acquired pneumonia. Streptococcal pneumonia/ present on admission,complicated by sepsis/status post cardiac arrest PEA.Continue oxygenating well with nasal cannula, up to 95% on 3 LPM flow. Will continue levofloxacin IV #6/8 (penicillin allergy). Blood pressure has improved.   2.Acute kidney injury. Contnue worsening  renal function with serum cr up to 3,23 from 3.18 with K at 4,5 and serum down to bicarbonate at 17, anion gap 13. Urine output 300 ml over last 24 hours, positive edema non pitting. Continue IV fluids to 75 ml per hour with balanced electrolytes. Will check urine Na and osmolality, clinically still intravascular depleted.  3. New systolic heart failure complicated with new onset atrial fibrillation. LV function severely depressed with EF 25 to 30% Has been on amiodarone for rate control, patient has remain on sinus rhythm. Continue telemetry monitoring. Holding anticoagulation due to genital bleeding.  3.Hypertension. Improved blood pressure with systolic 811 mmHg, will continue to hold on antihypertensive agents, continue hydration.   4.Depression. Continue with trazodone.  5. T2DM. Insulin sliding scale for glucose cover and monitoring. Capillary glucose 129, 156, 139, 148.  6. Swallow dysfunction. Continue dysphagia 3 protocol, with aspiration precautions.   7. New metabolic encephalopathy. Fluctuation in mentation, will continue close monitoring, neuro checks per unit protocol, patient at high risk for delirium.    DVT prophylaxis:enoxaparin Code Status:dnr Family Communication:I spoke with patient's family at the bedside and all questions were addressed.  Disposition Plan:transfer to medical unit with remote telemetry.    Consultants:    Procedures:    Antimicrobials:  Ceftriaxone.    Subjective: Patient is more awake this am, responding to questions, positive dyspnea and thirst, no chest pain or palpitations, poor appetite, no nausea or vomiting. Very weak and deconditioned.   Objective: Vitals:   09/20/17 0200 09/20/17 0309 09/20/17 0700 09/20/17 0800  BP: 113/60  (!) 141/73 (!) 148/71  Pulse: 64  64 65  Resp: (!) 21  (!) 21 (!) 22  Temp: 98.4 F (36.9 C)  98.1 F (36.7 C) 98.1 F (36.7 C)  TempSrc:    Core  SpO2: 95%  96% 95%    Weight:  77.6 kg (171 lb 1.2 oz)  Height:        Intake/Output Summary (Last 24 hours) at 09/20/2017 0822 Last data filed at 09/20/2017 0600 Gross per 24 hour  Intake 2384.18 ml  Output 300 ml  Net 2084.18 ml   Filed Weights   09/18/17 0300 09/19/17 0230 09/20/17 0309  Weight: 74.7 kg (164 lb 10.9 oz) 74.3 kg (163 lb 12.8 oz) 77.6 kg (171 lb 1.2 oz)    Examination:   General: deconditioned and ill looking appearing Neurology: Awake and alert, non focal  E ENT: mild pallor, no icterus, oral mucosa moist Cardiovascular: No JVD. S1-S2 present, rhythmic, no gallops, rubs, or murmurs. ++/+++ non pitting 4 extremity edema. Pulmonary: decreased breath sounds bilaterally, poor air movement, no wheezing, rhonchi or rales. Gastrointestinal. Abdomen no organomegaly, non tender, no rebound or guarding Skin. No rashes Musculoskeletal: no joint deformities     Data Reviewed: I have personally reviewed following labs and imaging studies  CBC: Recent Labs  Lab 09/30/2017 1559  09/16/17 0330 09/17/17 0500 09/18/17 0500 09/19/17 0344 09/20/17 0328  WBC 17.1*   < > 15.5* 15.3* 12.9* 13.0* 13.5*  NEUTROABS 15.0*  --  12.6*  --   --  10.5*  --   HGB 14.2   < > 13.5 12.7 11.9* 12.0 12.8  HCT 43.4   < > 41.3 38.3 35.7* 36.7 38.5  MCV 93.3   < > 91.6 91.2 90.6 89.3 88.7  PLT 242   < > 220 214 152 155 177   < > = values in this interval not displayed.   Basic Metabolic Panel: Recent Labs  Lab 09/16/17 0330 09/17/17 0500 09/18/17 0500 09/19/17 0344 09/20/17 0328  NA 133* 132* 134* 135 134*  K 4.2 4.2 4.2 4.1 4.5  CL 104 102 104 102 104  CO2 18* 18* 18* 19* 17*  GLUCOSE 157* 243* 183* 251* 146*  BUN 40* 58* 73* 77* 89*  CREATININE 2.42* 3.13* 3.03* 3.18* 3.23*  CALCIUM 8.1* 7.9* 8.0* 8.2* 8.5*  MG 1.6* 1.5*  --   --  2.0  PHOS 4.7* 5.5*  --   --   --    GFR: Estimated Creatinine Clearance: 12.1 mL/min (A) (by C-G formula based on SCr of 3.23 mg/dL (H)). Liver Function  Tests: Recent Labs  Lab 09/15/2017 1559 09/15/17 0115  AST 26 46*  ALT 27 35  ALKPHOS 65 66  BILITOT 1.9* 1.8*  PROT 6.1* 4.9*  ALBUMIN 3.7 2.7*   No results for input(s): LIPASE, AMYLASE in the last 168 hours. No results for input(s): AMMONIA in the last 168 hours. Coagulation Profile: No results for input(s): INR, PROTIME in the last 168 hours. Cardiac Enzymes: Recent Labs  Lab 09/15/17 0334 09/15/17 0900 09/15/17 1115 09/15/17 1459 09/17/17 0500  TROPONINI 0.62* 1.68* 2.02* 4.12* 1.05*   BNP (last 3 results) No results for input(s): PROBNP in the last 8760 hours. HbA1C: No results for input(s): HGBA1C in the last 72 hours. CBG: Recent Labs  Lab 09/19/17 1533 09/19/17 1921 09/19/17 2300 09/20/17 0307 09/20/17 0726  GLUCAP 135* 129* 156* 139* 148*   Lipid Profile: No results for input(s): CHOL, HDL, LDLCALC, TRIG, CHOLHDL, LDLDIRECT in the last 72 hours. Thyroid Function Tests: No results for input(s): TSH, T4TOTAL, FREET4, T3FREE, THYROIDAB in the last 72 hours. Anemia Panel: No results for input(s): VITAMINB12, FOLATE, FERRITIN, TIBC, IRON, RETICCTPCT in the last 72 hours.    Radiology Studies: I have reviewed all of the imaging during this hospital visit personally  Scheduled Meds: . chlorhexidine gluconate (MEDLINE KIT)  15 mL Mouth Rinse BID  . cholecalciferol  1,000 Units Oral Daily  . insulin aspart  1-3 Units Subcutaneous Q4H  . mouth rinse  15 mL Mouth Rinse QID  . traZODone  150 mg Oral QHS   Continuous Infusions: . sodium chloride 10 mL/hr at 09/20/17 0600  . amiodarone 30 mg/hr (09/20/17 0600)  . lactated ringers 75 mL/hr at 09/20/17 0600  . levofloxacin (LEVAQUIN) IV       LOS: 6 days        Lucious Zou Gerome Apley, MD [facility] Pager [phone]

## 2017-09-21 ENCOUNTER — Inpatient Hospital Stay (HOSPITAL_COMMUNITY): Payer: Medicare Other

## 2017-09-21 DIAGNOSIS — Z515 Encounter for palliative care: Secondary | ICD-10-CM

## 2017-09-21 DIAGNOSIS — Z7189 Other specified counseling: Secondary | ICD-10-CM

## 2017-09-21 LAB — OSMOLALITY, URINE: OSMOLALITY UR: 539 mosm/kg (ref 300–900)

## 2017-09-21 LAB — BASIC METABOLIC PANEL
ANION GAP: 12 (ref 5–15)
ANION GAP: 10 (ref 5–15)
BUN: 98 mg/dL — ABNORMAL HIGH (ref 6–20)
BUN: 90 mg/dL — ABNORMAL HIGH (ref 6–20)
CALCIUM: 8.1 mg/dL — AB (ref 8.9–10.3)
CHLORIDE: 104 mmol/L (ref 101–111)
CO2: 20 mmol/L — ABNORMAL LOW (ref 22–32)
CO2: 20 mmol/L — ABNORMAL LOW (ref 22–32)
Calcium: 8.6 mg/dL — ABNORMAL LOW (ref 8.9–10.3)
Chloride: 107 mmol/L (ref 101–111)
Creatinine, Ser: 3.08 mg/dL — ABNORMAL HIGH (ref 0.44–1.00)
Creatinine, Ser: 2.65 mg/dL — ABNORMAL HIGH (ref 0.44–1.00)
GFR calc non Af Amer: 13 mL/min — ABNORMAL LOW (ref >60)
GFR, EST AFRICAN AMERICAN: 15 mL/min — AB (ref >60)
GFR, EST AFRICAN AMERICAN: 18 mL/min — AB (ref >60)
GFR, EST NON AFRICAN AMERICAN: 15 mL/min — AB (ref >60)
GLUCOSE: 132 mg/dL — AB (ref 65–99)
GLUCOSE: 179 mg/dL — AB (ref 65–99)
POTASSIUM: 4.3 mmol/L (ref 3.5–5.1)
Potassium: 4.1 mmol/L (ref 3.5–5.1)
SODIUM: 137 mmol/L (ref 135–145)
Sodium: 136 mmol/L (ref 135–145)

## 2017-09-21 LAB — GLUCOSE, CAPILLARY
GLUCOSE-CAPILLARY: 199 mg/dL — AB (ref 65–99)
GLUCOSE-CAPILLARY: 183 mg/dL — AB (ref 65–99)
GLUCOSE-CAPILLARY: 166 mg/dL — AB (ref 65–99)
Glucose-Capillary: 106 mg/dL — ABNORMAL HIGH (ref 65–99)

## 2017-09-21 LAB — MAGNESIUM: MAGNESIUM: 2.1 mg/dL (ref 1.7–2.4)

## 2017-09-21 MED ORDER — CLOPIDOGREL BISULFATE 75 MG PO TABS
75.0000 mg | ORAL_TABLET | Freq: Every day | ORAL | Status: DC
  Administered 2017-09-21 – 2017-09-22 (×2): 75 mg via ORAL
  Filled 2017-09-21 (×2): qty 1

## 2017-09-21 MED ORDER — CARVEDILOL 3.125 MG PO TABS
3.1250 mg | ORAL_TABLET | Freq: Two times a day (BID) | ORAL | Status: DC
  Administered 2017-09-21 – 2017-09-22 (×3): 3.125 mg via ORAL
  Filled 2017-09-21 (×3): qty 1

## 2017-09-21 NOTE — Progress Notes (Signed)
Palliative Medicine consult noted. Due to high referral volume, there may be a delay seeing this patient. Please call the Palliative Medicine Team office at [phone] if recommendations are needed in the interim.  Thank you for inviting [place] to see this patient.  Marjie Skiff Jajuan Skoog, RN, BSN, [facility] Palliative Medicine Team 09/21/2017 11:29 AM Office [phone]

## 2017-09-21 NOTE — Progress Notes (Signed)
SLP Cancellation Note  Patient Details Name: JAIMY KLIETHERMES MRN: [id number] DOB: 1929/02/27   Cancelled treatment:       Reason Eval/Treat Not Completed: Other (comment)(pt with respiratory difficulties per note and now for morphine)   Macario Golds 09/21/2017, 10:48 AM  Luanna Salk, [facility] SLP [phone]

## 2017-09-21 NOTE — Progress Notes (Signed)
Progress Note  Patient Name: Sandra [name] Date of Encounter: 09/21/2017  Primary Cardiologist:   No primary care provider on file.   Subjective   Weak but no acute complaints.  No new SOB.  She was complaining of leg pain.   Inpatient Medications    Scheduled Meds: . cholecalciferol  1,000 Units Oral Daily  . mouth rinse  15 mL Mouth Rinse BID  . traZODone  150 mg Oral QHS   Continuous Infusions: . sodium chloride Stopped (09/20/17 0923)  . lactated ringers 75 mL/hr at 09/21/17 0044   PRN Meds: Place/Maintain arterial line **AND** sodium chloride, docusate, morphine injection, traMADol   Vital Signs    Vitals:   09/20/17 1100 09/20/17 1148 09/20/17 2047 09/21/17 0500  BP: (!) 168/76 (!) 157/77 (!) 158/69 (!) 150/84  Pulse: 66 66 74 69  Resp: (!) 25 20 16 16   Temp: 98.1 F (36.7 C) 98.3 F (36.8 C) 98.2 F (36.8 C) 98 F (36.7 C)  TempSrc:  Axillary Oral Oral  SpO2: 95% 93% 94% 93%  Weight:  168 lb 14.4 oz (76.6 kg)  171 lb 9.6 oz (77.8 kg)  Height:  5\' 2"  (1.575 m)      Intake/Output Summary (Last 24 hours) at 09/21/2017 1149 Last data filed at 09/21/2017 0600 Gross per 24 hour  Intake 1635 ml  Output 450 ml  Net 1185 ml   Filed Weights   09/20/17 0309 09/20/17 1148 09/21/17 0500  Weight: 171 lb 1.2 oz (77.6 kg) 168 lb 14.4 oz (76.6 kg) 171 lb 9.6 oz (77.8 kg)    Telemetry    NSR - Personally Reviewed  ECG    NA - Personally Reviewed  Physical Exam   GEN: No  acute distress.  Very frail Neck: No  JVD Cardiac: RRR, systolic murmur, no diastolic murmurs, rubs, or gallops.  Respiratory:    Decreased breath sounds GI: Soft, nontender, non-distended, normal bowel sounds  MS:    Moderate edema; No deformity. Neuro:   Nonfocal  Psych: Oriented   Labs    Chemistry Recent Labs  Lab 09/16/2017 1559 09/15/17 0115  09/19/17 0344 09/20/17 0328 09/21/17 0429  NA 137 138   < > 135 134* 136  K 3.8 4.0   < > 4.1 4.5 4.3  CL 105 107   < > 102 104  104  CO2 20* 18*   < > 19* 17* 20*  GLUCOSE 222* 204*   < > 251* 146* 132*  BUN 19 18   < > 77* 89* 98*  CREATININE 0.88 1.02*   < > 3.18* 3.23* 3.08*  CALCIUM 8.8* 7.8*   < > 8.2* 8.5* 8.6*  PROT 6.1* 4.9*  --   --   --   --   ALBUMIN 3.7 2.7*  --   --   --   --   AST 26 46*  --   --   --   --   ALT 27 35  --   --   --   --   ALKPHOS 65 66  --   --   --   --   BILITOT 1.9* 1.8*  --   --   --   --   GFRNONAA 58* 48*   < > 12* 12* 13*  GFRAA >60 56*   < > 14* 14* 15*  ANIONGAP 12 13   < > 14 13 12    < > = values in this interval  not displayed.     Hematology Recent Labs  Lab 09/18/17 0500 09/19/17 0344 09/20/17 0328  WBC 12.9* 13.0* 13.5*  RBC 3.94 4.11 4.34  HGB 11.9* 12.0 12.8  HCT 35.7* 36.7 38.5  MCV 90.6 89.3 88.7  MCH 30.2 29.2 29.5  MCHC 33.3 32.7 33.2  RDW 14.6 14.6 14.6  PLT 152 155 177    Cardiac Enzymes Recent Labs  Lab 09/15/17 0900 09/15/17 1115 09/15/17 1459 09/17/17 0500  TROPONINI 1.68* 2.02* 4.12* 1.05*   No results for input(s): TROPIPOC in the last 168 hours.   BNP Recent Labs  Lab 10/02/2017 1713  BNP 2,708.6*     DDimer  Recent Labs  Lab 09/11/2017 2326  DDIMER 0.81*     Radiology    Dg Abd 1 View  Result Date: 09/21/2017 CLINICAL DATA:  Pain EXAM: ABDOMEN - 1 VIEW COMPARISON:  First 06/25/2018 FINDINGS: There is oral contrast in the ascending and transverse colon regions. There is no bowel dilatation or air-fluid level to suggest bowel obstruction. No free air. There is airspace consolidation in the visualized portions of both lower lung zones. There is a presumed phlebolith lower right pelvis. IMPRESSION: Moderate contrast within the proximal to mid colon. No bowel dilatation to suggest bowel obstruction. No free air. Airspace consolidation both lower lung zones, incompletely visualized. Electronically Signed   By: Lowella Grip III M.D.   On: 09/21/2017 09:41    Cardiac Studies   Echo 09/15/17: Study Conclusions - Left  ventricle: The cavity size was normal. Systolic function was severely reduced. The estimated ejection fraction was in the range of 25% to 30%. Diffuse hypokinesis. Doppler parameters are consistent with abnormal left ventricular relaxation (grade 1 diastolic dysfunction). - Ventricular septum: Septal motion showed abnormal function and dyssynergy. The contour showed diastolic flattening. - Aortic valve: Trileaflet; mildly thickened, mildly calcified leaflets. - Mitral valve: There was mild to moderate regurgitation. - Right ventricle: Systolic function was moderately reduced. - Tricuspid valve: There was moderate regurgitation. - Pulmonary arteries: Systolic pressure was moderately increased. PA peak pressure: 50 mm Hg (S). - Pericardium, extracardiac: A trivial pericardial effusion was identified posterior to the heart.  Impressions: - EF is reduced when compared to prior (55%)    Patient Profile     84 y.o. female with a hx of HTN followed by Dr Alain Marion. Sheis being seen today for the evaluation of new LVD and elevated Troponin s/p PEA arrest.  Found to have new cardiomyopathy.   Assessment & Plan    PEA ARREST:    Overall decline.  Still not taking PO medicines.  She can take her pills crushed.  I will start Plavix back.  She was on this previously.    ACUTE SYSTOLIC HF:  EF reduced as above.  Start low dose beta blocker as below.    ATRIAL FIB:  She has maintained NSR.  Not able to take PO amiodarone. No change in therapy at this point.   AKI:   Creat is trending down.  Avoid diuretics  HTN:  BP is creeping up.  Now that she is taking POs I am going to start a low dose of Coreg.    For questions or updates, please contact [place] Please consult [email or web address] for contact info under Cardiology/STEMI.   Signed, Minus Breeding, MD  09/21/2017, 11:49 AM

## 2017-09-21 NOTE — Progress Notes (Signed)
Spoke with pt's daughter at bedside concerning disposition. Daughter states that she was waiting to Palliative Care MD to talk with her about her mother. If pt is going for SNF/rehab daughter asked for [facility]. PT to eval.

## 2017-09-21 NOTE — Progress Notes (Signed)
Patient had 16 runs of wide QRS, C/O mid lower chest pain 9 out of 10, vitals 152/88, 82,84%-2L, 22. PRN morphine given.  K.Kirby-NP notified orders written and night shift nurse updated to F/U with plan of care.

## 2017-09-21 NOTE — Consult Note (Signed)
Palliative Care Consult note  I met today with patient's daughter an son in law.  I introduced palliative care as specialized medical care for people living with serious illness. It focuses on providing relief from the symptoms and stress of a serious illness. The goal is to improve quality of life for both the patient and the family.  We discussed clinical course as well as wishes moving forward in regard to advanced directives.  Concepts specific to code status and rehospitalization discussed.  We discussed difference between a aggressive medical intervention path and a palliative, comfort focused care path.  Values and goals of care important to patient and family were attempted to be elicited.  Concept of Hospice and Palliative Care were discussed  - Daughter reports that she thinks that her mother would be best served by residential hospice, however, while she is certainly appropriate for hospice, I am not sure she will qualify for [facility] at this time - Consider SNF for rehab with palliative to see if able to regain any functional status. - Consider also SNF with hospice care. Daughter understanding that room & board for SNF in this scenario would be out-of-pocket.  - Family would like to see recommendations from PT prior to making futher decision regarding disposition.  Questions and concerns addressed.   PMT will continue to support holistically.  Total time: 75 minutes Greater than 50%  of this time was spent counseling and coordinating care related to the above assessment and plan.  Micheline Rough, MD [facility] Team [phone]

## 2017-09-21 NOTE — Evaluation (Signed)
Physical Therapy Evaluation Patient Details Name: Sandra [name] MRN: [id number] DOB: [birth year] Today's Date: 09/21/2017   History of Present Illness  84 year old female who presented with dyspnea. 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  Pt admitted with sepsis due to CAP and developed acute hypoxic respiratory failure, and had a pulseless electrical activity cardiac arrest, required ACLS procedures and was placed on invasive mechanical ventilation.  Clinical Impression  Pt admitted with above diagnosis. Pt currently with functional limitations due to the deficits listed below (see PT Problem List).  Pt will benefit from skilled PT to increase their independence and safety with mobility to allow discharge to the venue listed below.  Pt and family report pt has been mostly supine since admission.  Pt assisted to sitting upright at EOB and required significant assistance.  Pt also with increased work of breathing and saturations dropped to 85% on 2.5L O2 [place].  Pt assisted back to supine.  Pt presents with generalized weakness and very deconditioned.  Family reports pt was ambulatory without assistive device prior to admission however mostly in bed for about a month prior to admission.  Family discussing pt needs with palliative care and considering SNF for pt upon d/c.  Recommended pt have increased care at home, if d/c home, otherwise may need SNF.  Family is leaning towards SNF at this time.  Family also stated they are aware pt will need to show a little progress working with PT at SNF if discharged to this venue for rehab.     Follow Up Recommendations SNF;Supervision/Assistance - 24 hour    Equipment Recommendations  Wheelchair cushion (measurements PT);Wheelchair (measurements PT)    Recommendations for Other Services       Precautions / Restrictions  Precautions Precautions: Fall Precaution Comments: monitor sats      Mobility  Bed Mobility Overal bed mobility: Needs Assistance Bed Mobility: Supine to Sit;Sit to Supine     Supine to sit: Max assist;HOB elevated;+2 for physical assistance Sit to supine: Max assist;+2 for physical assistance   General bed mobility comments: verbal cues for technique, assist for upper and lower body required, pain in chest per pt, SpO2 dropped to 85% on 2.5 L O2  and pt with increased labor of breathing, also reporting nausea so returned to supine  Transfers                    Ambulation/Gait                Stairs            Wheelchair Mobility    Modified Rankin (Stroke Patients Only)       Balance Overall balance assessment: Needs assistance Sitting-balance support: Bilateral upper extremity supported;Feet supported Sitting balance-Leahy Scale: Zero Sitting balance - Comments: requiring trunk support to maintain upright posture Postural control: Posterior lean                                   Pertinent Vitals/Pain Pain Assessment: Faces Faces Pain Scale: Hurts little more Pain Location: R knee (reports chronic) Pain Descriptors / Indicators: Aching Pain Intervention(s): Limited activity within patient's tolerance;Repositioned;Premedicated before session;Monitored during session    Home Living Family/patient expects to be discharged to:: Private residence Living Arrangements: Alone Available Help at Discharge: Family;Available PRN/intermittently Type of Home: House  Home Layout: One level Home Equipment: Walker - 2 wheels      Prior Function Level of Independence: Independent         Comments: family reports pt was ambulatory without assistive device prior to admission however had been spending more time in bed the month leading up to admission     Hand Dominance        Extremity/Trunk Assessment   Upper Extremity  Assessment Upper Extremity Assessment: LUE deficits/detail;Generalized weakness LUE Deficits / Details: edematous L UE, RN notified, elevated with pillows    Lower Extremity Assessment Lower Extremity Assessment: Generalized weakness       Communication   Communication: No difficulties  Cognition Arousal/Alertness: Awake/alert Behavior During Therapy: WFL for tasks assessed/performed Overall Cognitive Status: Within Functional Limits for tasks assessed                                        General Comments      Exercises     Assessment/Plan    PT Assessment Patient needs continued PT services  PT Problem List Decreased strength;Decreased mobility;Decreased activity tolerance;Decreased balance;Decreased knowledge of use of DME;Cardiopulmonary status limiting activity       PT Treatment Interventions DME instruction;Gait training;Therapeutic exercise;Therapeutic activities;Patient/family education;Functional mobility training;Balance training    PT Goals (Current goals can be found in the Care Plan section)  Acute Rehab PT Goals PT Goal Formulation: With patient/family Time For Goal Achievement: 10/05/17 Potential to Achieve Goals: Fair    Frequency Min 2X/week   Barriers to discharge        Co-evaluation               AM-PAC PT "6 Clicks" Daily Activity  Outcome Measure Difficulty turning over in bed (including adjusting bedclothes, sheets and blankets)?: Unable Difficulty moving from lying on back to sitting on the side of the bed? : Unable Difficulty sitting down on and standing up from a chair with arms (e.g., wheelchair, bedside commode, etc,.)?: Unable Help needed moving to and from a bed to chair (including a wheelchair)?: Total Help needed walking in hospital room?: Total Help needed climbing 3-5 steps with a railing? : Total 6 Click Score: 6    End of Session Equipment Utilized During Treatment: Oxygen Activity Tolerance:  Patient limited by fatigue Patient left: in bed;with bed alarm set;with call bell/phone within reach Nurse Communication: Mobility status PT Visit Diagnosis: Muscle weakness (generalized) (M62.81);Difficulty in walking, not elsewhere classified (R26.2)    Time: 9147-8295 PT Time Calculation (min) (ACUTE ONLY): 20 min   Charges:   PT Evaluation $PT Eval Moderate Complexity: 1 Mod     PT G Codes:        Carmelia Bake, PT, DPT 09/21/2017 Pager: [phone]  [place] Ram E 09/21/2017, 4:08 PM

## 2017-09-21 NOTE — Clinical Social Work Note (Signed)
Clinical Social Work Assessment  Patient Details  Name: Sandra [name] MRN: [id number] Date of Birth: [birth year]  Date of referral:  09/21/17               Reason for consult:  End of Life/Hospice, Family Concerns, Facility Placement                Permission sought to share information with:  Family Supports Permission granted to share information::     Name::     daughter [name], son Sonia Side  Agency::     Relationship::     Contact Information:     Housing/Transportation Living arrangements for the past 2 months:  Single Family Home Source of Information:  Adult Children, Medical Team Patient Interpreter Needed:  None Criminal Activity/Legal Involvement Pertinent to Current Situation/Hospitalization:  No - Comment as needed Significant Relationships:  Adult Children Lives with:  Self Do you feel safe going back to the place where you live?  Yes Need for family participation in patient care:  Yes (Comment)(pt not oriented to situation, family making decisions)  Care giving concerns:  Pt lives at home alone, has good support from her family who lives nearby. Daughter explains that twice family believed pt would not survive this admission. Daughter states, "She had a heart attack after she came in for confusion and sepsis." Pt was on ventilator in ICU and transferred to telemetry. Daughter reports they feel hospice is desired care plan for pt at this point. She states, "She has been choking when she tries to swallow water or ice and on some mashed potatoes earlier today. We are told her kidney function is worsening also and she had pneumonia in both lungs. She is 84 years old and we want her to just be well- cared-for at this point."   Facilities manager / plan:  CSW met with daughter to assist with disposition planning.  (pt not oriented to situation) Daughter provided brief hx and care needs/family's wishes above. Discussed possible disposition plans with daughter extensively.   Daughter states family's first preference would be for pt to transition to [facility] but "understands she may not be at that point yet medically." States if residential/inpatient hospice not appropriate, family wants to consider 1) SNF for rehab with palliative to see if "she has any potential to get around better and then decide what to do." Daughter notes at this point family feels it is very unlikely that pt would participate with therapy at SNF enough to qualify her being there for rehab. Also very understanding that there is high unlikelihood that [employer] would authorize rehab given pt's overall health decline. 2) SNF with hospice care. Daughter understanding that room & board for SNF in this scenario would be out-of-pocket.  If plan is for SNF (whether for rehab or with hospice), family's preferences are for [facility] or [facility]. Will note yet refer to SNF as goal is undecided. Daughter states that in order to make decisions they feel they need to discuss pt's issues with swallowing/eating and her prognosis with providers.   Plan: TBD- see above.   Employment status:  Retired Brewing technologist) PT Recommendations:  [place] / Referral to community resources:     Patient/Family's Response to care:  Daughter very engaged and appreciative of care  Patient/Family's Understanding of and Emotional Response to Diagnosis, Current Treatment, and Prognosis:  Pt does not demonstrate understanding and has not been oriented to situation throughout  this hospitalization per chart and family. Daughter however demonstrates thorough understanding by describing hospital course, asking pertinent questions, and demonstrating reasonable expectations of goals of care moving forward. See CareGiving concerns for her statements indicating emotional status.   Emotional Assessment Appearance:  Appears stated age Attitude/Demeanor/Rapport:    Affect  (typically observed):  Calm Orientation:  Oriented to Self Alcohol / Substance use:  Not Applicable Psych involvement (Current and /or in the community):  No (Comment)  Discharge Needs  Concerns to be addressed:  Decision making concerns, Care Coordination, Discharge Planning Concerns Readmission within the last 30 days:  No Current discharge risk:  Dependent with Mobility(overall declining health this admission) Barriers to Discharge:  Continued Medical Work up, [employer], LCSW 09/21/2017, 4:33 PM [phone]

## 2017-09-21 NOTE — Progress Notes (Signed)
PROGRESS NOTE    Sandra [name]  ATF:[id number] DOB: April 13, 1929 DOA: 10/04/2017 PCP: Cassandria Anger, MD    Brief Narrative:  84 year old female who presented with dyspnea. She does have a significant past medical history for hypertension, anddyslipidemia.She complained of worsening dyspnea for the last 2 weeks prior to hospitalization, progressing into altered mentation.On her initial physical examination blood pressure 159/87, heart rate 100-120, temperature 99.1, respiratory rate 20to 30, oxygen saturation 98%.Lungs with bilateral rales, no wheezing, heart S1-S2 present, tachycardic, abdomen is soft nontender, no lower extremity edema. Sodium 137, potassium 3.8, chloride 105, bicarb 20, glucose 222, BUN 19, creatinine 0.88,white count 17.1, hemoglobin 14.2, hematocrit 43.3, platelets 242,d-dimer 0.81,urine analysis 150 glucose, pH 5, protein 100, specific gravity 1.020, 0-5 RBCs, 0-5 white cells.Chest x-ray with left lower lobe infiltrate, positive air bronchograms,bilateral pleural effusions.  Patient was admitted to the hospital with a working diagnosis of sepsis due to community-acquired pneumonia.  Patient developed acute hypoxic respiratory failure,andhad a pulseless electrical activity cardiac arrest,required ACLS procedures and was placed on invasive mechanical ventilation.  Assessment & Plan:   Principal Problem:   CAP (community acquired pneumonia) Active Problems:   Sepsis ([place])   Cardiac arrest ([place])   Endotracheally intubated   Atrial fibrillation with RVR ([place])   1.Acute hypoxic respiratory failure due to community-acquired pneumonia. Streptococcal pneumonia/ present on admission,complicated by sepsis/status post cardiac arrest PEA.Worsening dyspnea this am, at rest oxygenating up to 93% on 2,5 LPM flow. Antibiotic therapy with levofloxacin IV#7/8(penicillin allergy). Will repeat portable film this am, continue aspiration precautions and  bronchodilator therapy.   2.Acute kidney injury.Patient clinically continue to be hypovolemic, will continue gentle hydration with balanced electrolyte solutions, renal function with serum cr trending down to 3.0 with K at 4,3 and serum bicarbonate at 20, will follow on renal panel in am, avoid hypotension or nephrotoxic agents.  3. New systolic heart failure complicated with new onset atrial fibrillation. LV function severely depressed with EF 25 to 30%Patient has remained on sinus rhythm, amiodarone has been held, not on anticoagulation due to episode of genital bleeding. Will continue telemetry monitoring and gentle hydration, continue to hold on antihypertensive agents for now. Patient has been placed on clopidogrel.   3.Hypertension. continue blood pressure monitoring systolic 270 mmHg. Continue to hold on blood pressure agents, at home on atenolol, amlodipine and clonidine.   4.Depression.On trazodone, no agitation  5. T2DM. Continue with insulin sliding scale for glucose cover and monitoring. Capillary glucose 146, 151, 167, 106, 199. Poor oral intake.   6. Swallow dysfunction with . Continue dysphagia 3 protocol, will consult nutrition for assessment.   7. New metabolic encephalopathy. Continue to have fluctuation in mentation, this am is more awake and alert. Continue to be at high risk for delirium.    DVT prophylaxis:enoxaparin Code Status:dnr Family Communication: No family at the bedside  Disposition Plan:case discussed with Dr. Domingo Cocking, will plan for snf with palliative care at discharge, if continue deterioration may need hospice.     Consultants:    Procedures:    Antimicrobials:  Levofloxacin.   Subjective: This am with worsening dyspnea and not feeling well in general, no nausea or vomiting, poor appetite, positive right upper extremity edema.   Objective: Vitals:   09/20/17 1100 09/20/17 1148 09/20/17 2047 09/21/17 0500    BP: (!) 168/76 (!) 157/77 (!) 158/69 (!) 150/84  Pulse: 66 66 74 69  Resp: (!) 25 20 16 16   Temp: 98.1 F (36.7 C) 98.3 F (36.8 C)  98.2 F (36.8 C) 98 F (36.7 C)  TempSrc:  Axillary Oral Oral  SpO2: 95% 93% 94% 93%  Weight:  76.6 kg (168 lb 14.4 oz)  77.8 kg (171 lb 9.6 oz)  Height:  5\' 2"  (1.575 m)      Intake/Output Summary (Last 24 hours) at 09/21/2017 0910 Last data filed at 09/21/2017 0600 Gross per 24 hour  Intake 1646.9 ml  Output 450 ml  Net 1196.9 ml   Filed Weights   09/20/17 0309 09/20/17 1148 09/21/17 0500  Weight: 77.6 kg (171 lb 1.2 oz) 76.6 kg (168 lb 14.4 oz) 77.8 kg (171 lb 9.6 oz)    Examination:   General: deconditioned and ill looking appearing, positive dyspnea Neurology: Awake and alert, non focal  E PIR:JJOACZYS pallor, no icterus, oral mucosa moist Cardiovascular: No JVD. S1-S2 present, rhythmic, no gallops, rubs, or murmurs. No lower extremity edema. Right upper extremity edema, non pitting.  Pulmonary: decreased breath sounds bilaterally, poor air movement, no wheezing, scattered rhonchi and rales. Gastrointestinal. Abdomen  no organomegaly, non tender, no rebound or guarding Skin. No rashes Musculoskeletal: no joint deformities     Data Reviewed: I have personally reviewed following labs and imaging studies  CBC: Recent Labs  Lab 09/06/2017 1559  09/16/17 0330 09/17/17 0500 09/18/17 0500 09/19/17 0344 09/20/17 0328  WBC 17.1*   < > 15.5* 15.3* 12.9* 13.0* 13.5*  NEUTROABS 15.0*  --  12.6*  --   --  10.5*  --   HGB 14.2   < > 13.5 12.7 11.9* 12.0 12.8  HCT 43.4   < > 41.3 38.3 35.7* 36.7 38.5  MCV 93.3   < > 91.6 91.2 90.6 89.3 88.7  PLT 242   < > 220 214 152 155 177   < > = values in this interval not displayed.   Basic Metabolic Panel: Recent Labs  Lab 09/16/17 0330 09/17/17 0500 09/18/17 0500 09/19/17 0344 09/20/17 0328 09/21/17 0429  NA 133* 132* 134* 135 134* 136  K 4.2 4.2 4.2 4.1 4.5 4.3  CL 104 102 104 102 104 104   CO2 18* 18* 18* 19* 17* 20*  GLUCOSE 157* 243* 183* 251* 146* 132*  BUN 40* 58* 73* 77* 89* 98*  CREATININE 2.42* 3.13* 3.03* 3.18* 3.23* 3.08*  CALCIUM 8.1* 7.9* 8.0* 8.2* 8.5* 8.6*  MG 1.6* 1.5*  --   --  2.0  --   PHOS 4.7* 5.5*  --   --   --   --    GFR: Estimated Creatinine Clearance: 12.7 mL/min (A) (by C-G formula based on SCr of 3.08 mg/dL (H)). Liver Function Tests: Recent Labs  Lab 09/07/2017 1559 09/15/17 0115  AST 26 46*  ALT 27 35  ALKPHOS 65 66  BILITOT 1.9* 1.8*  PROT 6.1* 4.9*  ALBUMIN 3.7 2.7*   No results for input(s): LIPASE, AMYLASE in the last 168 hours. No results for input(s): AMMONIA in the last 168 hours. Coagulation Profile: No results for input(s): INR, PROTIME in the last 168 hours. Cardiac Enzymes: Recent Labs  Lab 09/15/17 0334 09/15/17 0900 09/15/17 1115 09/15/17 1459 09/17/17 0500  TROPONINI 0.62* 1.68* 2.02* 4.12* 1.05*   BNP (last 3 results) No results for input(s): PROBNP in the last 8760 hours. HbA1C: No results for input(s): HGBA1C in the last 72 hours. CBG: Recent Labs  Lab 09/20/17 0726 09/20/17 1156 09/20/17 1716 09/20/17 2053 09/21/17 0731  GLUCAP 148* 146* 151* 167* 106*   Lipid Profile: No results for  input(s): CHOL, HDL, LDLCALC, TRIG, CHOLHDL, LDLDIRECT in the last 72 hours. Thyroid Function Tests: No results for input(s): TSH, T4TOTAL, FREET4, T3FREE, THYROIDAB in the last 72 hours. Anemia Panel: No results for input(s): VITAMINB12, FOLATE, FERRITIN, TIBC, IRON, RETICCTPCT in the last 72 hours.    Radiology Studies: I have reviewed all of the imaging during this hospital visit personally     Scheduled Meds: . cholecalciferol  1,000 Units Oral Daily  . mouth rinse  15 mL Mouth Rinse BID  . traZODone  150 mg Oral QHS   Continuous Infusions: . sodium chloride Stopped (09/20/17 0923)  . lactated ringers 75 mL/hr at 09/21/17 0044     LOS: 7 days        Krystofer Hevener Gerome Apley, MD [facility] Pager [phone]

## 2017-09-21 NOTE — Care Management Important Message (Signed)
Important Message  Patient Details  Name: Sandra [name] MRN: [id number] Date of Birth: June 29, 1929   Medicare Important Message Given:  Yes    Kerin Salen 09/21/2017, 11:25 AMImportant Message  Patient Details  Name: Sandra [name] MRN: [id number] Date of Birth: 1929/03/27   Medicare Important Message Given:  Yes    Kerin Salen 09/21/2017, 11:25 AM

## 2017-09-22 ENCOUNTER — Inpatient Hospital Stay (HOSPITAL_COMMUNITY): Payer: Medicare Other

## 2017-09-22 DIAGNOSIS — Z515 Encounter for palliative care: Secondary | ICD-10-CM

## 2017-09-22 LAB — BASIC METABOLIC PANEL
Anion gap: 10 (ref 5–15)
BUN: 82 mg/dL — ABNORMAL HIGH (ref 6–20)
CHLORIDE: 106 mmol/L (ref 101–111)
CO2: 21 mmol/L — ABNORMAL LOW (ref 22–32)
CREATININE: 2.44 mg/dL — AB (ref 0.44–1.00)
Calcium: 8 mg/dL — ABNORMAL LOW (ref 8.9–10.3)
GFR, EST AFRICAN AMERICAN: 20 mL/min — AB (ref >60)
GFR, EST NON AFRICAN AMERICAN: 17 mL/min — AB (ref >60)
Glucose, Bld: 185 mg/dL — ABNORMAL HIGH (ref 65–99)
POTASSIUM: 4 mmol/L (ref 3.5–5.1)
SODIUM: 137 mmol/L (ref 135–145)

## 2017-09-22 LAB — GLUCOSE, CAPILLARY
GLUCOSE-CAPILLARY: 121 mg/dL — AB (ref 65–99)
GLUCOSE-CAPILLARY: 124 mg/dL — AB (ref 65–99)
GLUCOSE-CAPILLARY: 168 mg/dL — AB (ref 65–99)
Glucose-Capillary: 158 mg/dL — ABNORMAL HIGH (ref 65–99)

## 2017-09-22 MED ORDER — AMIODARONE HCL IN DEXTROSE 360-4.14 MG/200ML-% IV SOLN
30.0000 mg/h | INTRAVENOUS | Status: DC
  Administered 2017-09-23: 30 mg/h via INTRAVENOUS
  Filled 2017-09-22: qty 200

## 2017-09-22 MED ORDER — ENSURE ENLIVE PO LIQD: 237.0000 mL | Freq: Two times a day (BID) | ORAL | Status: DC

## 2017-09-22 MED ORDER — FUROSEMIDE 10 MG/ML IJ SOLN
60.0000 mg | Freq: Once | INTRAMUSCULAR | Status: AC
  Administered 2017-09-22: 60 mg via INTRAVENOUS
  Filled 2017-09-22: qty 6

## 2017-09-22 MED ORDER — AMIODARONE HCL 200 MG PO TABS
400.0000 mg | ORAL_TABLET | Freq: Two times a day (BID) | ORAL | Status: DC
  Administered 2017-09-22: 400 mg via ORAL
  Filled 2017-09-22: qty 2

## 2017-09-22 MED ORDER — AMIODARONE LOAD VIA INFUSION
150.0000 mg | Freq: Once | INTRAVENOUS | Status: AC
  Administered 2017-09-22: 150 mg via INTRAVENOUS
  Filled 2017-09-22: qty 83.34

## 2017-09-22 MED ORDER — METOPROLOL TARTRATE 5 MG/5ML IV SOLN
5.0000 mg | Freq: Once | INTRAVENOUS | Status: AC
  Administered 2017-09-22: 5 mg via INTRAVENOUS
  Filled 2017-09-22: qty 5

## 2017-09-22 MED ORDER — AMIODARONE HCL IN DEXTROSE 360-4.14 MG/200ML-% IV SOLN
60.0000 mg/h | INTRAVENOUS | Status: AC
  Administered 2017-09-22 (×2): 60 mg/h via INTRAVENOUS
  Filled 2017-09-22 (×2): qty 200

## 2017-09-22 NOTE — Progress Notes (Signed)
Daily Progress Note   Patient Name: Sandra [name]       Date: 09/22/2017 DOB: Feb 05, 1929  Age: 84 y.o. MRN#: [id number] Attending Physician: Patrecia Pour, Christean Grief, MD Primary Care Physician: Cassandria Anger, MD Admit Date: 09/10/2017  Reason for Consultation/Follow-up: Establishing goals of care  Subjective:  patient is on NRB mask, she is resting in bed She appears weak and deconditioned, PT notes reviewed Call placed, unable to reach daughter [name] at [phone].  Discussed with TRH MD, see below:   Length of Stay: 8  Current Medications: Scheduled Meds:  . carvedilol  3.125 mg Oral BID WC  . cholecalciferol  1,000 Units Oral Daily  . clopidogrel  75 mg Oral Daily  . feeding supplement (ENSURE ENLIVE)  237 mL Oral BID BM  . mouth rinse  15 mL Mouth Rinse BID  . traZODone  150 mg Oral QHS    Continuous Infusions: . sodium chloride Stopped (09/20/17 0923)  . amiodarone 60 mg/hr (09/22/17 1601)   Followed by  . amiodarone      PRN Meds: Place/Maintain arterial line **AND** sodium chloride, docusate, morphine injection, traMADol  Physical Exam         Appears sleepy on the NRB mask Opens eyes when name is called Irregularly irregular Shallow clear breath sounds Abdomen soft Has trace edema Appears weak, chronically deconditioned and ill  Vital Signs: BP 130/63 (BP Location: Right Arm)   Pulse 79   Temp 98.6 F (37 C) (Oral)   Resp (!) 22   Ht 5\' 2"  (1.575 m)   Wt 80.5 kg (177 lb 9 oz)   SpO2 95%   BMI 32.48 kg/m  SpO2: SpO2: 95 % O2 Device: O2 Device: Room Air O2 Flow Rate: O2 Flow Rate (L/min): 7.5 L/min  Intake/output summary:   Intake/Output Summary (Last 24 hours) at 09/22/2017 1628 Last data filed at 09/22/2017 1300 Gross per 24 hour  Intake 50 ml    Output 750 ml  Net -700 ml   LBM: Last BM Date: 10/04/2017 Baseline Weight: Weight: 63.5 kg (140 lb) Most recent weight: Weight: 80.5 kg (177 lb 9 oz)      PPS 20% Palliative Assessment/Data:      Patient Active Problem List   Diagnosis Date Noted  . Atrial fibrillation with RVR ([place])   .  Sepsis ([place])   . Cardiac arrest ([place])   . Endotracheally intubated   . Shortness of breath 09/16/2017  . CAP (community acquired pneumonia) 09/26/2017  . Well adult exam 03/25/2017  . Anemia 02/15/2016  . Fatigue 11/13/2015  . Pain in joint, shoulder region 08/27/2015  . Duodenitis without bleeding 03/23/2015  . Fall at home 03/19/2015  . Facial trauma-right side 03/19/2015  . Nausea vomiting and diarrhea 03/19/2015  . Dehydration 03/19/2015  . Acute kidney injury ([place]) 03/19/2015  . Constipation 02/14/2015  . Peripheral neuropathy 02/03/2015  . Headache 01/30/2015  . Acute confusional state 01/30/2015  . Trigeminal neuralgia of right side of face 01/24/2015  . Sweats, menopausal 06/19/2014  . Neoplasm of uncertain behavior of skin 06/15/2013  . Cystitis 03/16/2013  . Bruising 02/28/2013  . Diarrhea 11/11/2012  . Eczema 04/26/2012  . Need for prophylactic vaccination and inoculation against influenza 04/26/2012  . Fever 03/09/2012  . Ataxia 01/20/2012  . Staggering 01/12/2012  . Occipital neuralgia 06/25/2011  . Grief reaction 01/13/2011  . Chest pain at rest 01/13/2011  . Paresthesia 11/13/2010  . LOW BACK PAIN 04/17/2010  . WHEEZING 06/07/2009  . TOBACCO USE, QUIT 05/25/2009  . BRONCHITIS, ACUTE 07/19/2008  . Cough 07/19/2008  . CONTUSION, LOWER LEG, LEFT 09/27/2007  . B12 deficiency 09/15/2007  . PERIPHERAL NEUROPATHY 09/15/2007  . Vitamin D deficiency 06/14/2007  . Anxiety state 06/14/2007  . ABNORMAL GLUCOSE NEC 06/14/2007  . Dyslipidemia 05/21/2007  . Essential hypertension 05/21/2007  . OSTEOPOROSIS 05/21/2007  . Insomnia 05/21/2007  . COLONIC POLYPS, HX OF  05/21/2007    Palliative Care Assessment & Plan   Patient Profile:    Assessment:  40F with hypertension here with PEA arrest, newly diagnosed acute systolic and diastolic heart failure, and atrial fibrillation with RVR. PMT consulted for [place] discussions.   Recommendations/Plan Patient's overall condition remains tenuous, in my opinion. She has high O2 requirements, remains on venturi mask, she has A fib with RVR earlier today, over all, unsure of how much she will actually be able to participate in a rehab setting. Alternatively, could consider residential hospice after discharge in efforts to focus on comfort measures and hospice philosophy of care. Discussed with TRH MD. Appreciate CSW following up with daughter.     Code Status:    Code Status Orders  (From admission, onward)        Start     Ordered   09/15/17 1131  Do not attempt resuscitation (DNR)  Continuous    Question Answer Comment  In the event of cardiac or respiratory ARREST Do not call a "code blue"   In the event of cardiac or respiratory ARREST Do not perform Intubation, CPR, defibrillation or ACLS   In the event of cardiac or respiratory ARREST Use medication by any route, position, wound care, and other measures to relive pain and suffering. May use oxygen, suction and manual treatment of airway obstruction as needed for comfort.   Comments levophed infusion ok, no higher than 20 mcg/kg      09/15/17 1131    Code Status History    Date Active Date Inactive Code Status Order ID Comments User Context   09/19/2017 20:01 09/15/2017 11:31 Full Code [id number]  Jani Gravel, MD Inpatient   03/19/2015 20:04 03/24/2015 16:14 Full Code [id number]  Modena Jansky, MD Inpatient    Advance Directive Documentation     Most Recent Value  Type of Advance Directive  Healthcare Power of [place], Living will  Pre-existing out of facility DNR order (yellow form or pink MOST form)  No data  "MOST" Form in Place?  No data        Prognosis:   guarded, could be less than 2 weeks.   Discharge Planning:  Recommend residential hospice. Discussed with TRH MD.   Care plan was discussed with as above   Thank you for allowing the Palliative Medicine Team to assist in the care of this patient.   Time In: 11 Time Out: 11.25 Total Time 25 Prolonged Time Billed  no       Greater than 50%  of this time was spent counseling and coordinating care related to the above assessment and plan.  Loistine Chance, MD [phone]  Please contact Palliative Medicine Team phone at [phone] for questions and concerns.

## 2017-09-22 NOTE — Progress Notes (Addendum)
Pt O2 saturation dropped to the low 80s.Upon assessment pt was SOB. O2 flow rate was increased form 2.5L to 4L and respiratory therapist was called to assess patient. 1mg  of IV morphine was given. O2 increased to 94%. NP Baltazar Najjar was notified. Will continue to monitor.    --Pt unable to maintain O2 saturation above 92 on nasal cannula. Applied venturi mask at 8L to maintain saturation at 95. NP Tylene Fantasia notified. Will continue to monitor.

## 2017-09-22 NOTE — Progress Notes (Signed)
Followed up with pt's daughter today re: disposition planning. Daughter reports at this point "we see she is getting worse every day. We don't expect she will have much time left." Reports "she is not eating or drinking today, went into a fib this morning." Daughter reports she is feeling family will want to pursue hospice at appropriate venue and will discuss with providers what pt's needs and expectations of her prognosis. CSW will follow and assist. Anticipate possible hospice at SNF v residential hospice based on pt's reported overall decline.  Sharren Bridge, MSW, LCSW Clinical Social Work 09/22/2017 [phone]

## 2017-09-22 NOTE — Progress Notes (Signed)
Nutrition Follow-up  DOCUMENTATION CODES:   Obesity unspecified  INTERVENTION:   Ensure Enlive po BID, each supplement provides 350 kcal and 20 grams of protein  NUTRITION DIAGNOSIS:   Inadequate oral intake related to inability to eat as evidenced by NPO status.  Resolved- intake remains inadequate  GOAL:   Provide needs based on ASPEN/SCCM guidelines  Resolved  MONITOR:   Diet advancement, Vent status, TF tolerance, I & O's, Labs, Weight trends  REASON FOR ASSESSMENT:   Ventilator    ASSESSMENT:   Patient with PMH significant for HTN, HLD, osteoporosis, and vitamin D deficiency. Presents this admission with shortness of breath and confusion. Initially pt was admitted for CAP but became hypoxic and had PEA arrest in the ED. Pt was intubated and transferred to ICU.    Pt extubated. Pt recently diagnosed with acute heart failure. Last night pt's heart rate increased and a Venti mask was required. Venti mask still in place upon RD follow up. Spoke with RN who reports pt did not eat much last night or this morning due to distress. Meal completions show 50% for breakfast yesterday and 25% for lunch yesterday. Nurse states pt is asking for water this afternoon but has not ate. RD to provide supplements in attempts to maximize calories and protein. Weight noted to increase 10 lb since last RD visit 3/13 (166 lb to 177 lb). Will continue to monitor.   Palliative met with pt and family yesterday. Awaiting PT results to make final decision regarding goals of care.   Medications reviewed and include: Vit D Labs reviewed: BUN 82 (H) Creatinine 2.44 (H)  Diet Order:  DIET DYS 3 Room service appropriate? Yes; Fluid consistency: Thin  EDUCATION NEEDS:   Not appropriate for education at this time  Skin:  Skin Assessment: Reviewed RN Assessment  Last BM:  09/17/17  Height:   Ht Readings from Last 1 Encounters:  09/20/17 '5\' 2"'$  (1.575 m)    Weight:   Wt Readings from Last 1  Encounters:  09/22/17 177 lb 9 oz (80.5 kg)    Ideal Body Weight:  50 kg  BMI:  Body mass index is 32.48 kg/m.  Estimated Nutritional Needs:   Kcal:  1450-1650 kcal/day  Protein:  70-80 g/day  Fluid:  >1.4 L/day    Mariana Single RD, LDN Clinical Nutrition Pager # - [phone]

## 2017-09-22 NOTE — Progress Notes (Signed)
Progress Note  Patient Name: Sandra [name] Date of Encounter: 09/22/2017  Primary Cardiologist: Minus Breeding, MD   Subjective   The patient is sleepy but arousable and answers questions.  She denies chest discomfort, shortness of breath, palpitations, lightheadedness.  She is on oxygen Via Venturi mask at 40%.  SCDs in place.  Inpatient Medications    Scheduled Meds: . carvedilol  3.125 mg Oral BID WC  . cholecalciferol  1,000 Units Oral Daily  . clopidogrel  75 mg Oral Daily  . mouth rinse  15 mL Mouth Rinse BID  . traZODone  150 mg Oral QHS   Continuous Infusions: . sodium chloride Stopped (09/20/17 0923)   PRN Meds: Place/Maintain arterial line **AND** sodium chloride, docusate, morphine injection, traMADol   Vital Signs    Vitals:   09/22/17 0320 09/22/17 0325 09/22/17 0500 09/22/17 0540  BP: 139/75 126/81  126/62  Pulse:    68  Resp:    20  Temp:    98.1 F (36.7 C)  TempSrc:    Axillary  SpO2:    93%  Weight:   177 lb 9 oz (80.5 kg)   Height:        Intake/Output Summary (Last 24 hours) at 09/22/2017 1021 Last data filed at 09/22/2017 0600 Gross per 24 hour  Intake 870 ml  Output 1350 ml  Net -480 ml   Filed Weights   09/20/17 1148 09/21/17 0500 09/22/17 0500  Weight: 168 lb 14.4 oz (76.6 kg) 171 lb 9.6 oz (77.8 kg) 177 lb 9 oz (80.5 kg)    Telemetry    Sinus rhythm in the 70s with intermittent atrial fibrillation in the 120s-130s.  Currently in A. fib in the 120s.- Personally Reviewed  ECG    EKG at 02 56 this morning showed Atrial fibrillation with rapid ventricular response, 134 bpm, Incomplete left bundle branch block- Personally Reviewed  Physical Exam   GEN: No acute distress.   Neck: No JVD Cardiac:  Irregularly irregular rhythm, no murmurs, rubs, or gallops.  Respiratory: Clear to auscultation bilaterally. GI: Soft, nontender, non-distended  MS:  Trace generalized edema; No deformity. Neuro:  Nonfocal, patient is sleepy but answers  questions appropriately Psych: Normal affect   Labs    Chemistry Recent Labs  Lab 09/20/17 0328 09/21/17 0429 09/21/17 2001  NA 134* 136 137  K 4.5 4.3 4.1  CL 104 104 107  CO2 17* 20* 20*  GLUCOSE 146* 132* 179*  BUN 89* 98* 90*  CREATININE 3.23* 3.08* 2.65*  CALCIUM 8.5* 8.6* 8.1*  GFRNONAA 12* 13* 15*  GFRAA 14* 15* 18*  ANIONGAP 13 12 10      Hematology Recent Labs  Lab 09/18/17 0500 09/19/17 0344 09/20/17 0328  WBC 12.9* 13.0* 13.5*  RBC 3.94 4.11 4.34  HGB 11.9* 12.0 12.8  HCT 35.7* 36.7 38.5  MCV 90.6 89.3 88.7  MCH 30.2 29.2 29.5  MCHC 33.3 32.7 33.2  RDW 14.6 14.6 14.6  PLT 152 155 177    Cardiac Enzymes Recent Labs  Lab 09/15/17 1115 09/15/17 1459 09/17/17 0500  TROPONINI 2.02* 4.12* 1.05*   No results for input(s): TROPIPOC in the last 168 hours.   BNPNo results for input(s): BNP, PROBNP in the last 168 hours.   DDimer No results for input(s): DDIMER in the last 168 hours.   Radiology    Dg Chest 1 View  Result Date: 09/21/2017 CLINICAL DATA:  Dyspnea and cough EXAM: CHEST  1 VIEW COMPARISON:  09/18/2017  FINDINGS: Cardiac shadow is enlarged but stable. Aortic calcifications are seen. Bibasilar changes are noted with increasing right-sided pleural effusion when compared with the recent exam. No bony abnormality is noted. IMPRESSION: Bibasilar atelectatic changes and increasing right-sided pleural effusion. Electronically Signed   By: Inez Catalina M.D.   On: 09/21/2017 19:30   Dg Abd 1 View  Result Date: 09/21/2017 CLINICAL DATA:  Pain EXAM: ABDOMEN - 1 VIEW COMPARISON:  First 06/25/2018 FINDINGS: There is oral contrast in the ascending and transverse colon regions. There is no bowel dilatation or air-fluid level to suggest bowel obstruction. No free air. There is airspace consolidation in the visualized portions of both lower lung zones. There is a presumed phlebolith lower right pelvis. IMPRESSION: Moderate contrast within the proximal to mid  colon. No bowel dilatation to suggest bowel obstruction. No free air. Airspace consolidation both lower lung zones, incompletely visualized. Electronically Signed   By: Lowella Grip III M.D.   On: 09/21/2017 09:41   Dg Chest Port 1 View  Result Date: 09/22/2017 CLINICAL DATA:  Increased oxygen demand. EXAM: PORTABLE CHEST 1 VIEW COMPARISON:  Yesterday at 1853 hour, additional priors FINDINGS: Progressive bilateral pleural effusions and associated airspace opacities over the past week. Cardiomegaly which is partially obscured. Bilateral perihilar opacities suspicious for pulmonary edema. No pneumothorax. IMPRESSION: Progressive bilateral pleural effusions and associated airspace opacities over the past week. Findings suspicious for CHF exacerbation. An element of pneumonia is also considered. Electronically Signed   By: Jeb Levering M.D.   On: 09/22/2017 03:19    Cardiac Studies   Echo 09/15/17: Study Conclusions - Left ventricle: The cavity size was normal. Systolic function was severely reduced. The estimated ejection fraction was in the range of 25% to 30%. Diffuse hypokinesis. Doppler parameters are consistent with abnormal left ventricular relaxation (grade 1 diastolic dysfunction). - Ventricular septum: Septal motion showed abnormal function and dyssynergy. The contour showed diastolic flattening. - Aortic valve: Trileaflet; mildly thickened, mildly calcified leaflets. - Mitral valve: There was mild to moderate regurgitation. - Right ventricle: Systolic function was moderately reduced. - Tricuspid valve: There was moderate regurgitation. - Pulmonary arteries: Systolic pressure was moderately increased. PA peak pressure: 50 mm Hg (S). - Pericardium, extracardiac: A trivial pericardial effusion was identified posterior to the heart.  Impressions: - EF is reduced when compared to prior (55%)  Patient Profile     84 y.o. female with a hx of HTN followed by  Dr Alain Marion. Sheis being seen today for the evaluation of new LVD and elevated Troponin s/p PEA arrest.  Found to have new cardiomyopathy.  Assessment & Plan    PEA arrest: Following decompensation and respiratory failure with question of cardiac vs pulmonary origin. Being treated for PNA. Troponins were elevated with peak of 4.12 and trended down.  Patient was not felt to be candidate for invasive evaluation due to renal dysfunction.  She is being managed medically and Plavix was resumed, she was on this prior to admission.  Acute combined systolic and diastolic CHF: EF down to 95-62% from 55% with diffuse hypokinesis, Septal motion showed abnormal function and dyssynergy and contour showing diastolic flattening, mod MR. Low dose carvedilol was added yesterday and BP is tolerating well.  The patient has generalized mild edema.  Weight is up today.  Lungs are clear and no JVD or gallop present.  Will discuss addition of gentle diuresis with Dr. Oval Linsey.  Would need to watch renal function closely.  Atrial fibrillation: Was given IV amiodarone and  was maintaining sinus rhythm so amio was stopped. She was anticoagulated with heparin but had vaginal bleeding so was stopped. Currently was in sinus rhythm in the 70s but had breakthrough atrial fib in the 130s for about 45 minutes during the night and currently has just gone back into A. fib in the 120s. Pt has no awareness of being in A. fib.  We will restart amiodarone as she is now able to swallow pills.  AKI: Serum creatinine trending down.  No results for today.  Will place orders.  Avoiding diuretics.`  Hypertension: Patient was initially hypotensive.  BP has been creeping up.  Low-dose carvedilol was added yesterday and BP is currently stable.   For questions or updates, please contact [place] Please consult [email or web address] for contact info under Cardiology/STEMI.      Signed, Daune Perch, NP  09/22/2017, 10:21 AM

## 2017-09-22 NOTE — Progress Notes (Signed)
PROGRESS NOTE [facility]   Sandra [name]   TKP:[id number] DOB: 1928-12-01  DOA: 09/20/2017 PCP: Cassandria Anger, MD   Brief Narrative:  Sandra [name] is a 84 year old female with past medical history of hypertension and dyslipidemia who presented to the emergency department with dyspnea was admitted with working diagnosis of sepsis due to community-acquired pneumonia.  Patient developed acute hypoxic respiratory failure and had PEA cardiac arrest, required ACLS went to ROCS, and was placed on mechanical ventilation.  Patient was successfully extubated and has been treated with IV antibiotics.  Subsequently patient developed A. fib with RVR and continues to have increasing respiratory effort. Patient now DNR  Subjective: Patient seen and examined, she reports feeling okay today but in high flow oxygen.  She went into A. fib with RVR overnight and has been placed on amiodarone drip.  She denies chest pain, palpitations and weakness.  Patient is not eating report poor appetite.   Assessment & Plan: Acute hypoxic respiratory failure due to community-acquired pneumonia - streptococcal pneumonia.  Status post cardiac arrest and intubation, she was successfully extubated. Respiratory status now continues to worsen requiring high flow oxygen.  Patient on Levaquin 8/8 (penicillin allergy). Chest x-ray shows increasing pleural effusion concerning for CHF exacerbation Will give Lasix IV 1 dose and continue to monitor.  Patient very decondition and not significantly improving.  I think the patient is a candidate for residential hospice, discussed with palliative care.  Will discuss with daughter in a.m. If no significant improvement.  New systolic heart failure with new onset atrial fibrillation, complicated with PEA cardiac arrest status post ROCS Patient remains in A. fib with RVR Cardiology on board and have resume amiodarone infusion Patient was placed on Plavix, as not candidate for  anticoagulation due to episode of genital bleeding. Echo shows LV function severely depressed with EF of 25-30% Continue management per cardiology recommendation  Acute renal failure Likely secondary to hypotension from cardiac arrest Serum creatinine trending down Avoid hypotension and nephrotoxic agents  Hypertension BP seems to be stable Patient on Coreg 3.125 twice daily, atenolol discontinue Discontinue amlodipine due to severe LV dysfunction Clonidine on hold Continue to monitor  Depression  C/w trazodone  Acute metabolic encephalopathy Multifactorial from renal failure, heart failure and infectious process Patient continues to wax and wane in mentation today she is more alert.  Goals of care Patient seems to have multiple with heart failure, with very slow recovery and actually worsening of respiratory system.  Patient also not getting nutrition so severe deconditioning contributing to poor recovery.  I suspect the patient will not have a meaningful recovery and she could be a candidate for hospice in the next 24-48 hours.   DVT prophylaxis: Lovenox Code Status: DNR Family Communication: None at bedside Disposition Plan: Possible residential hospice and 24-48 hours if continued to deteriorate  Consultants:   Cardiology  Palliative care  Procedures:   None    Antimicrobials:  Levaquin   Objective: Vitals:   09/22/17 0500 09/22/17 0540 09/22/17 1049 09/22/17 1333  BP:  126/62 123/74 130/63  Pulse:  68  79  Resp:  20 (!) 22   Temp:  98.1 F (36.7 C)  98.6 F (37 C)  TempSrc:  Axillary  Oral  SpO2:  93%  95%  Weight: 80.5 kg (177 lb 9 oz)     Height:        Intake/Output Summary (Last 24 hours) at 09/22/2017 1621 Last data filed at 09/22/2017 1300 Gross per  24 hour  Intake 50 ml  Output 750 ml  Net -700 ml   Filed Weights   09/20/17 1148 09/21/17 0500 09/22/17 0500  Weight: 76.6 kg (168 lb 14.4 oz) 77.8 kg (171 lb 9.6 oz) 80.5 kg (177 lb 9 oz)      Examination:  General exam: Mild respiratory distress HEENT: OP moist and clear Respiratory system: Decreased breath sounds bilaterally, bibasilar crackles Cardiovascular system: S1-S2, irregularly irregular, + systolic murmur Gastrointestinal system: Abdomen is nondistended, soft and nontender.  Central nervous system: Alert and oriented. No focal neurological deficits. Extremities: Bilateral lower extremity and upper extremity edema 2+ Skin: No rashes  Data Reviewed: I have personally reviewed following labs and imaging studies  CBC: Recent Labs  Lab 09/16/17 0330 09/17/17 0500 09/18/17 0500 09/19/17 0344 09/20/17 0328  WBC 15.5* 15.3* 12.9* 13.0* 13.5*  NEUTROABS 12.6*  --   --  10.5*  --   HGB 13.5 12.7 11.9* 12.0 12.8  HCT 41.3 38.3 35.7* 36.7 38.5  MCV 91.6 91.2 90.6 89.3 88.7  PLT 220 214 152 155 270   Basic Metabolic Panel: Recent Labs  Lab 09/16/17 0330 09/17/17 0500  09/19/17 0344 09/20/17 0328 09/21/17 0429 09/21/17 2001 09/22/17 1300  NA 133* 132*   < > 135 134* 136 137 137  K 4.2 4.2   < > 4.1 4.5 4.3 4.1 4.0  CL 104 102   < > 102 104 104 107 106  CO2 18* 18*   < > 19* 17* 20* 20* 21*  GLUCOSE 157* 243*   < > 251* 146* 132* 179* 185*  BUN 40* 58*   < > 77* 89* 98* 90* 82*  CREATININE 2.42* 3.13*   < > 3.18* 3.23* 3.08* 2.65* 2.44*  CALCIUM 8.1* 7.9*   < > 8.2* 8.5* 8.6* 8.1* 8.0*  MG 1.6* 1.5*  --   --  2.0  --  2.1  --   PHOS 4.7* 5.5*  --   --   --   --   --   --    < > = values in this interval not displayed.   GFR: Estimated Creatinine Clearance: 16.3 mL/min (A) (by C-G formula based on SCr of 2.44 mg/dL (H)). Liver Function Tests: No results for input(s): AST, ALT, ALKPHOS, BILITOT, PROT, ALBUMIN in the last 168 hours. No results for input(s): LIPASE, AMYLASE in the last 168 hours. No results for input(s): AMMONIA in the last 168 hours. Coagulation Profile: No results for input(s): INR, PROTIME in the last 168 hours. Cardiac  Enzymes: Recent Labs  Lab 09/17/17 0500  TROPONINI 1.05*   BNP (last 3 results) No results for input(s): PROBNP in the last 8760 hours. HbA1C: No results for input(s): HGBA1C in the last 72 hours. CBG: Recent Labs  Lab 09/21/17 1152 09/21/17 1657 09/21/17 2152 09/22/17 0750 09/22/17 1203  GLUCAP 199* 183* 166* 121* 124*   Lipid Profile: No results for input(s): CHOL, HDL, LDLCALC, TRIG, CHOLHDL, LDLDIRECT in the last 72 hours. Thyroid Function Tests: No results for input(s): TSH, T4TOTAL, FREET4, T3FREE, THYROIDAB in the last 72 hours. Anemia Panel: No results for input(s): VITAMINB12, FOLATE, FERRITIN, TIBC, IRON, RETICCTPCT in the last 72 hours. Sepsis Labs: Recent Labs  Lab 09/16/17 0330 09/17/17 0500  PROCALCITON 3.51 2.29    Recent Results (from the past 240 hour(s))  Blood culture (routine x 2)     Status: None   Collection Time: 09/11/2017  6:40 PM  Result Value Ref Range Status  Specimen Description   Final    BLOOD RIGHT ANTECUBITAL Performed at [facility] [address] [place], [place] [address]    Special Requests   Final    BOTTLES DRAWN AEROBIC AND ANAEROBIC Blood Culture adequate volume Performed at [facility] [address] [place], [place] [address]    Culture   Final    NO GROWTH 5 DAYS Performed at [facility], [place] [address], [place], [place] [address]    Report Status 09/19/2017 FINAL  Final  Urine culture     Status: Abnormal   Collection Time: 09/18/2017  6:50 PM  Result Value Ref Range Status   Specimen Description   Final    URINE, CLEAN CATCH Performed at [facility], [place] [address], [place], [place] [address]    Special Requests   Final    Normal Performed at [facility], [place] [address] [place], [place] [address]    Culture MULTIPLE SPECIES PRESENT, SUGGEST RECOLLECTION (A)  Final   Report Status 09/16/2017 FINAL  Final  Blood culture  (routine x 2)     Status: None   Collection Time: 09/27/2017  6:55 PM  Result Value Ref Range Status   Specimen Description   Final    BLOOD LEFT ANTECUBITAL Performed at [facility] [address] [place], [place] [address]    Special Requests   Final    BOTTLES DRAWN AEROBIC AND ANAEROBIC Blood Culture adequate volume Performed at [facility] [address], [place], [place] [address]    Culture   Final    NO GROWTH 5 DAYS Performed at [facility], [place] [address] [place], [place] [address]    Report Status 09/19/2017 FINAL  Final  MRSA PCR Screening     Status: None   Collection Time: 09/15/17  2:03 AM  Result Value Ref Range Status   MRSA by PCR NEGATIVE NEGATIVE Final    Comment:        The GeneXpert MRSA Assay (FDA approved for NASAL specimens only), is one component of a comprehensive MRSA colonization surveillance program. It is not intended to diagnose MRSA infection nor to guide or monitor treatment for MRSA infections. Performed at [facility], [place] [address], [place], [place] [address]   Respiratory Panel by PCR     Status: None   Collection Time: 09/15/17  7:29 AM  Result Value Ref Range Status   Adenovirus NOT DETECTED NOT DETECTED Final   Coronavirus 229E NOT DETECTED NOT DETECTED Final   Coronavirus HKU1 NOT DETECTED NOT DETECTED Final   Coronavirus NL63 NOT DETECTED NOT DETECTED Final   Coronavirus OC43 NOT DETECTED NOT DETECTED Final   Metapneumovirus NOT DETECTED NOT DETECTED Final   Rhinovirus / Enterovirus NOT DETECTED NOT DETECTED Final   Influenza A NOT DETECTED NOT DETECTED Final   Influenza B NOT DETECTED NOT DETECTED Final   Parainfluenza Virus 1 NOT DETECTED NOT DETECTED Final   Parainfluenza Virus 2 NOT DETECTED NOT DETECTED Final   Parainfluenza Virus 3 NOT DETECTED NOT DETECTED Final   Parainfluenza Virus 4 NOT DETECTED NOT DETECTED Final   Respiratory Syncytial Virus NOT  DETECTED NOT DETECTED Final   Bordetella pertussis NOT DETECTED NOT DETECTED Final   Chlamydophila pneumoniae NOT DETECTED NOT DETECTED Final   Mycoplasma pneumoniae NOT DETECTED NOT DETECTED Final    Comment: Performed at [facility], [place] [address],  [place], [place] [address]      Radiology Studies: Dg Chest 1 View  Result Date: 09/21/2017 CLINICAL DATA:  Dyspnea and cough EXAM: CHEST  1 VIEW COMPARISON:  09/18/2017 FINDINGS: Cardiac shadow is enlarged but stable. Aortic calcifications are seen. Bibasilar changes are noted with increasing right-sided pleural effusion when compared with the recent exam. No bony abnormality is noted. IMPRESSION: Bibasilar atelectatic changes and increasing right-sided pleural effusion. Electronically Signed   By: Inez Catalina M.D.   On: 09/21/2017 19:30   Dg Abd 1 View  Result Date: 09/21/2017 CLINICAL DATA:  Pain EXAM: ABDOMEN - 1 VIEW COMPARISON:  First 06/25/2018 FINDINGS: There is oral contrast in the ascending and transverse colon regions. There is no bowel dilatation or air-fluid level to suggest bowel obstruction. No free air. There is airspace consolidation in the visualized portions of both lower lung zones. There is a presumed phlebolith lower right pelvis. IMPRESSION: Moderate contrast within the proximal to mid colon. No bowel dilatation to suggest bowel obstruction. No free air. Airspace consolidation both lower lung zones, incompletely visualized. Electronically Signed   By: Lowella Grip III M.D.   On: 09/21/2017 09:41   Dg Chest Port 1 View  Result Date: 09/22/2017 CLINICAL DATA:  Increased oxygen demand. EXAM: PORTABLE CHEST 1 VIEW COMPARISON:  Yesterday at 1853 hour, additional priors FINDINGS: Progressive bilateral pleural effusions and associated airspace opacities over the past week. Cardiomegaly which is partially obscured. Bilateral perihilar opacities suspicious for pulmonary edema. No pneumothorax. IMPRESSION: Progressive  bilateral pleural effusions and associated airspace opacities over the past week. Findings suspicious for CHF exacerbation. An element of pneumonia is also considered. Electronically Signed   By: Jeb Levering M.D.   On: 09/22/2017 03:19    Scheduled Meds: . carvedilol  3.125 mg Oral BID WC  . cholecalciferol  1,000 Units Oral Daily  . clopidogrel  75 mg Oral Daily  . feeding supplement (ENSURE ENLIVE)  237 mL Oral BID BM  . mouth rinse  15 mL Mouth Rinse BID  . traZODone  150 mg Oral QHS   Continuous Infusions: . sodium chloride Stopped (09/20/17 0923)  . amiodarone 60 mg/hr (09/22/17 1601)   Followed by  . amiodarone       LOS: 8 days    Time spent: Total of 35 minutes spent with pt, greater than 50% of which was spent in discussion of  treatment, counseling and coordination of care   Chipper Oman, MD Pager: Text Page via [email or web address]   If 7PM-7AM, please contact night-coverage [email or web address] 09/22/2017, 4:21 PM   Note - This record has been created using [employer]. Chart creation errors have been sought, but may not always have been located. Such creation errors do not reflect on the standard of medical care.

## 2017-09-22 NOTE — Progress Notes (Signed)
Pt HR elevated to 132 at rest. Central telemetry notified this RN. Upon assessment Pt HR had decreased to the 80s. NP Tylene Fantasia notified. No new orders at this time. Will continue to monitor.

## 2017-09-22 NOTE — Progress Notes (Signed)
SLP Cancellation Note  Patient Details Name: Sandra [name] MRN: [id number] DOB: 1928-09-19   Cancelled treatment:       Reason Eval/Treat Not Completed: Other (comment)(note pt being followed by palliative with potential hospice involvement)   Macario Golds 09/22/2017, 7:48 AM  Luanna Salk, [facility] SLP [phone]

## 2017-09-22 NOTE — Progress Notes (Signed)
EKG obtained, labs drawn, and vitals taken. NP Baltazar Najjar updated. Pt states that "Pain is better". Will continue to monitor.

## 2017-09-22 NOTE — Progress Notes (Addendum)
Pt HR 130s-150s on the monitor. EKG obtained and NP K. Baltazar Najjar paged. No new orders at this time. Will continue to monitor closely.   ---5mg  Metoprolol IV ordered and given. Pt now reading normal sinus in the 70s on the monitor. Will continue to monitor closely.

## 2017-09-23 DIAGNOSIS — R0600 Dyspnea, unspecified: Secondary | ICD-10-CM

## 2017-09-23 DIAGNOSIS — J9601 Acute respiratory failure with hypoxia: Secondary | ICD-10-CM

## 2017-09-23 LAB — CBC WITH DIFFERENTIAL/PLATELET
BASOS ABS: 0 10*3/uL (ref 0.0–0.1)
Basophils Relative: 0 %
EOS PCT: 0 %
Eosinophils Absolute: 0 10*3/uL (ref 0.0–0.7)
HCT: 42.3 % (ref 36.0–46.0)
Hemoglobin: 13.7 g/dL (ref 12.0–15.0)
LYMPHS PCT: 6 %
Lymphs Abs: 1 10*3/uL (ref 0.7–4.0)
MCH: 29 pg (ref 26.0–34.0)
MCHC: 32.4 g/dL (ref 30.0–36.0)
MCV: 89.4 fL (ref 78.0–100.0)
MONO ABS: 1.8 10*3/uL — AB (ref 0.1–1.0)
Monocytes Relative: 9 %
Neutro Abs: 15.9 10*3/uL — ABNORMAL HIGH (ref 1.7–7.7)
Neutrophils Relative %: 85 %
PLATELETS: 176 10*3/uL (ref 150–400)
RBC: 4.73 MIL/uL (ref 3.87–5.11)
RDW: 15.1 % (ref 11.5–15.5)
WBC: 18.7 10*3/uL — ABNORMAL HIGH (ref 4.0–10.5)

## 2017-09-23 LAB — BASIC METABOLIC PANEL
Anion gap: 13 (ref 5–15)
BUN: 75 mg/dL — AB (ref 6–20)
CALCIUM: 8.2 mg/dL — AB (ref 8.9–10.3)
CO2: 23 mmol/L (ref 22–32)
CREATININE: 2.28 mg/dL — AB (ref 0.44–1.00)
Chloride: 105 mmol/L (ref 101–111)
GFR calc Af Amer: 21 mL/min — ABNORMAL LOW (ref >60)
GFR, EST NON AFRICAN AMERICAN: 18 mL/min — AB (ref >60)
GLUCOSE: 140 mg/dL — AB (ref 65–99)
POTASSIUM: 3.8 mmol/L (ref 3.5–5.1)
Sodium: 141 mmol/L (ref 135–145)

## 2017-09-23 LAB — GLUCOSE, CAPILLARY: Glucose-Capillary: 181 mg/dL — ABNORMAL HIGH (ref 65–99)

## 2017-09-23 MED ORDER — HALOPERIDOL 0.5 MG PO TABS
0.5000 mg | ORAL_TABLET | ORAL | Status: DC | PRN
  Filled 2017-09-23: qty 1

## 2017-09-23 MED ORDER — ACETAMINOPHEN 650 MG RE SUPP: 650.0000 mg | Freq: Four times a day (QID) | RECTAL | Status: DC | PRN

## 2017-09-23 MED ORDER — POLYVINYL ALCOHOL 1.4 % OP SOLN: 1.0000 [drp] | Freq: Four times a day (QID) | OPHTHALMIC | Status: DC | PRN

## 2017-09-23 MED ORDER — ONDANSETRON HCL 4 MG/2ML IJ SOLN: 4.0000 mg | Freq: Four times a day (QID) | INTRAMUSCULAR | Status: DC | PRN

## 2017-09-23 MED ORDER — SODIUM CHLORIDE 0.9 % IV SOLN
2.0000 mg/h | INTRAVENOUS | Status: DC
  Administered 2017-09-23: 2 mg/h via INTRAVENOUS
  Filled 2017-09-23: qty 10

## 2017-09-23 MED ORDER — HALOPERIDOL LACTATE 2 MG/ML PO CONC
0.5000 mg | ORAL | Status: DC | PRN
  Filled 2017-09-23: qty 0.3

## 2017-09-23 MED ORDER — GLYCOPYRROLATE 0.2 MG/ML IJ SOLN
0.2000 mg | INTRAMUSCULAR | Status: DC | PRN
  Filled 2017-09-23: qty 1

## 2017-09-23 MED ORDER — ONDANSETRON 4 MG PO TBDP: 4.0000 mg | ORAL_TABLET | Freq: Four times a day (QID) | ORAL | Status: DC | PRN

## 2017-09-23 MED ORDER — MORPHINE BOLUS VIA INFUSION
2.0000 mg | INTRAVENOUS | Status: DC | PRN
  Filled 2017-09-23: qty 2

## 2017-09-23 MED ORDER — GLYCOPYRROLATE 1 MG PO TABS
1.0000 mg | ORAL_TABLET | ORAL | Status: DC | PRN
  Filled 2017-09-23: qty 1

## 2017-09-23 MED ORDER — SODIUM CHLORIDE 0.9 % IV SOLN
INTRAVENOUS | Status: DC
  Administered 2017-09-23: 09:00:00 via INTRAVENOUS

## 2017-09-23 MED ORDER — ACETAMINOPHEN 325 MG PO TABS: 650.0000 mg | ORAL_TABLET | Freq: Four times a day (QID) | ORAL | Status: DC | PRN

## 2017-09-23 MED ORDER — BIOTENE DRY MOUTH MT LIQD: 15.0000 mL | OROMUCOSAL | Status: DC | PRN

## 2017-09-23 MED ORDER — HALOPERIDOL LACTATE 5 MG/ML IJ SOLN: 0.5000 mg | INTRAMUSCULAR | Status: DC | PRN

## 2017-09-23 MED ORDER — FUROSEMIDE 10 MG/ML IJ SOLN
60.0000 mg | Freq: Once | INTRAMUSCULAR | Status: AC
  Administered 2017-09-23: 60 mg via INTRAVENOUS
  Filled 2017-09-23: qty 6

## 2017-10-05 NOTE — Progress Notes (Signed)
SLP Cancellation Note  Patient Details Name: Sandra [name] MRN: [id number] DOB: Nov 16, 1928   Cancelled treatment:       Reason Eval/Treat Not Completed: Other (comment)(pt deceased)   Macario Golds 09-29-17, 11:16 AM  Luanna Salk, [facility] SLP [phone]

## 2017-10-05 NOTE — Progress Notes (Signed)
RN wasted 250 ml of morphine drip with Agricultural consultant, Hinton Dyer.

## 2017-10-05 NOTE — Death Summary Note (Signed)
DEATH SUMMARY   Patient Details  Name: Sandra [name] MRN: [id number] DOB: 04/05/1929  Admission/Discharge Information   Admit Date:  09-30-2017  Date of Death: Date of Death: 09-Oct-2017  Time of Death: Time of Death: 0941  Length of Stay: 09-29-22  Referring Physician: Cassandria Anger, MD   Reason(s) for Hospitalization  Pneumonia   Diagnoses  Preliminary cause of death:  Secondary Diagnoses (including complications and co-morbidities):  Principal Problem:   CAP (community acquired pneumonia) Active Problems:   Goals of care, counseling/discussion   Dyspnea   Sepsis ([place])   Cardiac arrest ([place])   Endotracheally intubated   Atrial fibrillation with RVR ([place])   Encounter for palliative care Acute hypoxic respiratory failure multifactorial due to community-acquired pneumonia and fluid overload. - streptococcal pneumonia.  New systolic heart failure with new onset atrial fibrillation, complicated with PEA cardiac arrest status post ROCS  Acute renal failure  Hypertension  Depression   Acute metabolic encephalopathy  Brief Hospital Course (including significant findings, care, treatment, and services provided and events leading to death)  Sandra [name] is a 84 y.o. year old female who who presented to the emergency department with dyspnea was admitted with working diagnosis of sepsis due to community-acquired pneumonia.  Patient developed acute hypoxic respiratory failure and had PEA cardiac arrest, required ACLS went to ROCS, and was placed on mechanical ventilation.  Patient was successfully extubated and was treated with IV antibiotics.  Subsequently patient developed A. fib with RVR. Patient continued to decline with respiratory distress and was made comfort care. Patient was placed on morphine for resp distress and patient passed away at 09:41 AM. RN declared patient death and family was informed.    Pertinent Labs and Studies  Significant Diagnostic Studies Dg Chest 1  View  Result Date: 09/21/2017 CLINICAL DATA:  Dyspnea and cough EXAM: CHEST  1 VIEW COMPARISON:  09/18/2017 FINDINGS: Cardiac shadow is enlarged but stable. Aortic calcifications are seen. Bibasilar changes are noted with increasing right-sided pleural effusion when compared with the recent exam. No bony abnormality is noted. IMPRESSION: Bibasilar atelectatic changes and increasing right-sided pleural effusion. Electronically Signed   By: Inez Catalina M.D.   On: 09/21/2017 19:30   Dg Chest 2 View  Result Date: 30-Sep-2017 CLINICAL DATA:  Shortness of breath with poor oxygen saturation and tachycardia. EXAM: CHEST - 2 VIEW COMPARISON:  11/13/2015 FINDINGS: The heart is enlarged. There is aortic atherosclerosis. There are bilateral pleural effusions with dependent atelectasis. There is patchy density in both lower lobes. Upper lobes are clear. The findings are more consistent with pneumonia, but there could also be an element of congestive heart failure. IMPRESSION: Bilateral effusions, patchy infiltrate/atelectasis in both lower lungs. Findings more consistent with pneumonia, but there could be an element of congestive heart failure. Electronically Signed   By: Nelson Chimes M.D.   On: September 30, 2017 16:34   Dg Abd 1 View  Result Date: 09/21/2017 CLINICAL DATA:  Pain EXAM: ABDOMEN - 1 VIEW COMPARISON:  First 06/25/2018 FINDINGS: There is oral contrast in the ascending and transverse colon regions. There is no bowel dilatation or air-fluid level to suggest bowel obstruction. No free air. There is airspace consolidation in the visualized portions of both lower lung zones. There is a presumed phlebolith lower right pelvis. IMPRESSION: Moderate contrast within the proximal to mid colon. No bowel dilatation to suggest bowel obstruction. No free air. Airspace consolidation both lower lung zones, incompletely visualized. Electronically Signed   By: Lowella Grip  III M.D.   On: 09/21/2017 09:41   Dg Abd 1  View  Result Date: 09/15/2017 CLINICAL DATA:  Enteric tube placement. EXAM: ABDOMEN - 1 VIEW COMPARISON:  CT of the abdomen and pelvis performed 03/22/2015 FINDINGS: The patient's enteric tube is noted ending overlying the fundus of the stomach, with the side port about the gastroesophageal junction. There is distention of small-bowel loops to 3.6 cm in diameter. There is suggestion of air within the cecum and at the splenic flexure of the colon. This may reflect ileus. No free intra-abdominal air is seen, though evaluation for free air is limited on a single supine view. No acute osseous abnormalities are identified. Mild degenerative change is noted at the mid lumbar spine. IMPRESSION: 1. Enteric tube noted ending overlying the fundus of the stomach, with the side port about the gastroesophageal junction. 2. Distention of small-bowel loops to 3.6 cm in diameter. This may reflect ileus, given air within the colon. Electronically Signed   By: Garald Balding M.D.   On: 09/15/2017 02:34   Dg Chest Port 1 View  Result Date: 09/22/2017 CLINICAL DATA:  Increased oxygen demand. EXAM: PORTABLE CHEST 1 VIEW COMPARISON:  Yesterday at 1853 hour, additional priors FINDINGS: Progressive bilateral pleural effusions and associated airspace opacities over the past week. Cardiomegaly which is partially obscured. Bilateral perihilar opacities suspicious for pulmonary edema. No pneumothorax. IMPRESSION: Progressive bilateral pleural effusions and associated airspace opacities over the past week. Findings suspicious for CHF exacerbation. An element of pneumonia is also considered. Electronically Signed   By: Jeb Levering M.D.   On: 09/22/2017 03:19   Dg Chest Port 1 View  Result Date: 09/18/2017 CLINICAL DATA:  Acute respiratory failure EXAM: PORTABLE CHEST 1 VIEW COMPARISON:  09/17/2017 FINDINGS: Endotracheal tube removed.  NG removed. Improvement in bilateral airspace disease. Small bilateral effusions and bibasilar  atelectasis similar. IMPRESSION: Improvement in the pulmonary edema. Residual bilateral effusions and bibasilar atelectasis unchanged. Endotracheal tube removed. Electronically Signed   By: Franchot Gallo M.D.   On: 09/18/2017 08:41   Dg Chest Port 1 View  Result Date: 09/17/2017 CLINICAL DATA:  Intubation.  Hypoxia. EXAM: PORTABLE CHEST 1 VIEW COMPARISON:  09/16/2017. FINDINGS: Endotracheal tube and NG tube in stable position. Cardiomegaly with bilateral pulmonary interstitial prominence and bilateral pleural effusions consistent with CHF. IMPRESSION: 1.  Lines and tubes in stable position. 2. Cardiomegaly with diffuse bilateral interstitial prominence and bilateral pleural effusions consistent with CHF. Electronically Signed   By: Marcello Moores  Register   On: 09/17/2017 09:33   Dg Chest Port 1 View  Result Date: 09/16/2017 CLINICAL DATA:  Hypoxia EXAM: PORTABLE CHEST 1 VIEW COMPARISON:  September 15, 2017 FINDINGS: Endotracheal tube tip is 3.2 cm above the carina. Nasogastric tube tip and side port are in the stomach. No pneumothorax. There is left lower lobe airspace consolidation with left pleural effusion. There is a minimal right pleural effusion. Lungs elsewhere clear. There is cardiomegaly with pulmonary vascularity within normal limits. There is aortic atherosclerosis. No adenopathy. No bone lesions. IMPRESSION: Tube positions as described without evident pneumothorax. Airspace consolidation left lower lobe with left pleural effusion, increased from 1 day prior. Rather minimal right pleural effusion present. Right lung otherwise clear. Stable cardiac prominence. There is aortic atherosclerosis. Aortic Atherosclerosis (ICD10-I70.0). Electronically Signed   By: Lowella Grip III M.D.   On: 09/16/2017 07:20   Dg Chest Port 1 View  Result Date: 09/15/2017 CLINICAL DATA:  84 y/o  F; post intubation. EXAM: PORTABLE CHEST 1  VIEW COMPARISON:  10/02/2017 chest radiograph. FINDINGS: Stable cardiomegaly given  projection and technique. Aortic atherosclerosis with calcification. Endotracheal tube tip projects 1.7 cm above carina. Enteric tube tip extends below field of view and abdomen. Transcutaneous pacing pads noted. Interval increase in patchy opacities throughout the lungs probably representing alveolar pulmonary edema. Pneumonia is possible. Small effusions. Bones are unremarkable. IMPRESSION: 1. Endotracheal tube tip projects 1.7 cm above carina. Enteric tube tip below field of view and abdomen. 2. Increased diffuse patchy opacities probably representing alveolar pulmonary edema. Pneumonia is possible. Small effusions. Electronically Signed   By: Kristine Garbe M.D.   On: 09/15/2017 02:22   Dg Swallowing Func-speech Pathology  Result Date: 09/18/2017 Objective Swallowing Evaluation: Type of Study: MBS-Modified Barium Swallow Study  Patient Details Name: Sandra [name] MRN: [id number] Date of Birth: 06-14-1929 Today's Date: 09/18/2017 Time: SLP Start Time (ACUTE ONLY): 1545 -SLP Stop Time (ACUTE ONLY): 1610 SLP Time Calculation (min) (ACUTE ONLY): 25 min Past Medical History: Past Medical History: Diagnosis Date . Anxiety  . Colon polyps  . HTN (hypertension)  . Hyperlipidemia  . LBP (low back pain) 2011  right . Osteoporosis  . Peripheral neuropathy  . Strabismus   L eye . Vitamin B12 deficiency  . Vitamin D deficiency  Past Surgical History: Past Surgical History: Procedure Laterality Date . ABDOMINAL HYSTERECTOMY   HPI: 84 yo female adm to [facility] with respiratory deficits- she had PEA and required CPR, intubation.   Pt CXR 09/17/2017 showed bilateral interstitial prominence with bilateral effusions c/w CHF.  Pt has h/o cervical dysphagia- with findings of esophageal spasm with tertiary contractions and siliding hiatal hernia- 04/2002.      Subjective: pt awake in chair, now with improved phonation Assessment / Plan / Recommendation CHL IP CLINICAL IMPRESSIONS 09/18/2017 Clinical Impression Pt with much  improved phonatory ability by the time MBS completed.  NO aspiration observed and mild flash (deep) penetration of thin noted that adequately cleared.  Minimal delay in oral coordination/transiting with pill and liquid primarily.  Pt had overall strong swallow without severe residuals. She did become mildly short of breath and with increased HR during MBS, therefore rec dys3/thin diet with aspiration and esophageal precautions.  SLP Visit Diagnosis Dysphagia, oropharyngeal phase (R13.12) Attention and concentration deficit following -- Frontal lobe and executive function deficit following -- Impact on safety and function Mild aspiration risk   CHL IP TREATMENT RECOMMENDATION 09/18/2017 Treatment Recommendations Therapy as outlined in treatment plan below   Prognosis 09/18/2017 Prognosis for Safe Diet Advancement Good Barriers to Reach Goals -- Barriers/Prognosis Comment -- CHL IP DIET RECOMMENDATION 09/18/2017 SLP Diet Recommendations Dysphagia 3 (Mech soft) solids;Thin liquid Liquid Administration via Cup Medication Administration Whole meds with puree Compensations Minimize environmental distractions;Slow rate;Small sips/bites Postural Changes Seated upright at 90 degrees;Remain semi-upright after after feeds/meals (Comment)   CHL IP OTHER RECOMMENDATIONS 09/18/2017 Recommended Consults -- Oral Care Recommendations Oral care BID Other Recommendations --   CHL IP FOLLOW UP RECOMMENDATIONS 09/18/2017 Follow up Recommendations Skilled Nursing facility   [facility] IP FREQUENCY AND DURATION 09/18/2017 Speech Therapy Frequency (ACUTE ONLY) min 1 x/week Treatment Duration 1 week      CHL IP ORAL PHASE 09/18/2017 Oral Phase Impaired Oral - Pudding Teaspoon -- Oral - Pudding Cup -- Oral - Honey Teaspoon -- Oral - Honey Cup -- Oral - Nectar Teaspoon Premature spillage Oral - Nectar Cup Premature spillage Oral - Nectar Straw -- Oral - Thin Teaspoon Premature spillage Oral - Thin Cup Premature spillage Oral -  Thin Straw Premature spillage  Oral - Puree Premature spillage;WFL Oral - Mech Soft -- Oral - Regular Premature spillage;WFL Oral - Multi-Consistency -- Oral - Pill Premature spillage Oral Phase - Comment --  CHL IP PHARYNGEAL PHASE 09/18/2017 Pharyngeal Phase Impaired Pharyngeal- Pudding Teaspoon -- Pharyngeal -- Pharyngeal- Pudding Cup -- Pharyngeal -- Pharyngeal- Honey Teaspoon -- Pharyngeal -- Pharyngeal- Honey Cup -- Pharyngeal -- Pharyngeal- Nectar Teaspoon WFL Pharyngeal -- Pharyngeal- Nectar Cup WFL Pharyngeal -- Pharyngeal- Nectar Straw -- Pharyngeal -- Pharyngeal- Thin Teaspoon WFL Pharyngeal -- Pharyngeal- Thin Cup Penetration/Aspiration during swallow Pharyngeal Material enters airway, remains ABOVE vocal cords then ejected out Pharyngeal- Thin Straw Penetration/Aspiration during swallow Pharyngeal Material enters airway, remains ABOVE vocal cords then ejected out Pharyngeal- Puree WFL Pharyngeal -- Pharyngeal- Mechanical Soft -- Pharyngeal -- Pharyngeal- Regular WFL Pharyngeal -- Pharyngeal- Multi-consistency -- Pharyngeal -- Pharyngeal- Pill WFL Pharyngeal -- Pharyngeal Comment --  CHL IP CERVICAL ESOPHAGEAL PHASE 09/18/2017 Cervical Esophageal Phase Impaired Pudding Teaspoon -- Pudding Cup -- Honey Teaspoon -- Honey Cup -- Nectar Teaspoon -- Nectar Cup -- Nectar Straw -- Thin Teaspoon -- Thin Cup -- Thin Straw -- Puree -- Mechanical Soft -- Regular -- Multi-consistency -- Pill -- Cervical Esophageal Comment barium tablet taken with thin appeared to lodge at distal esophagus without pt awareness, pudding bolus transited tablet into stomach Luanna Salk, MS [facility] SLP [phone] No flowsheet data found. Macario Golds 09/18/2017, 4:29 PM               Microbiology Recent Results (from the past 240 hour(s))  Blood culture (routine x 2)     Status: None   Collection Time: 09/05/2017  6:40 PM  Result Value Ref Range Status   Specimen Description   Final    BLOOD RIGHT ANTECUBITAL Performed at [facility] [address] [place], [place] [address]    Special Requests   Final    BOTTLES DRAWN AEROBIC AND ANAEROBIC Blood Culture adequate volume Performed at [facility] [address] [place], [place] [address]    Culture   Final    NO GROWTH 5 DAYS Performed at [facility], [place] [address] [place], [place] [address]    Report Status 09/19/2017 FINAL  Final  Urine culture     Status: Abnormal   Collection Time: 09/30/2017  6:50 PM  Result Value Ref Range Status   Specimen Description   Final    URINE, CLEAN CATCH Performed at [facility], [place] [address] [place], [place] [address]    Special Requests   Final    Normal Performed at [facility], [place] [address], [place], [place] [address]    Culture MULTIPLE SPECIES PRESENT, SUGGEST RECOLLECTION (A)  Final   Report Status 09/16/2017 FINAL  Final  Blood culture (routine x 2)     Status: None   Collection Time: 09/17/2017  6:55 PM  Result Value Ref Range Status   Specimen Description   Final    BLOOD LEFT ANTECUBITAL Performed at [facility] [address], [place], [place] [address]    Special Requests   Final    BOTTLES DRAWN AEROBIC AND ANAEROBIC Blood Culture adequate volume Performed at [facility] [address], [place], [place] [address]    Culture   Final    NO GROWTH 5 DAYS Performed at [facility], [place] [address], [place], [place] [address]    Report Status  09/19/2017 FINAL  Final  MRSA PCR Screening     Status: None   Collection Time: 09/15/17  2:03 AM  Result Value Ref Range Status   MRSA by PCR NEGATIVE NEGATIVE Final    Comment:        The GeneXpert MRSA Assay (FDA approved for NASAL specimens only), is one component of a comprehensive MRSA colonization surveillance program. It is not intended to diagnose MRSA infection nor to guide or monitor treatment for MRSA infections. Performed at [facility], [place] [address] [place], [place] [address]   Respiratory Panel by PCR     Status: None   Collection Time: 09/15/17  7:29 AM  Result Value Ref Range Status   Adenovirus NOT DETECTED NOT DETECTED Final   Coronavirus 229E NOT DETECTED NOT DETECTED Final   Coronavirus HKU1 NOT DETECTED NOT DETECTED Final   Coronavirus NL63 NOT DETECTED NOT DETECTED Final   Coronavirus OC43 NOT DETECTED NOT DETECTED Final   Metapneumovirus NOT DETECTED NOT DETECTED Final   Rhinovirus / Enterovirus NOT DETECTED NOT DETECTED Final   Influenza A NOT DETECTED NOT DETECTED Final   Influenza B NOT DETECTED NOT DETECTED Final   Parainfluenza Virus 1 NOT DETECTED NOT DETECTED Final   Parainfluenza Virus 2 NOT DETECTED NOT DETECTED Final   Parainfluenza Virus 3 NOT DETECTED NOT DETECTED Final   Parainfluenza Virus 4 NOT DETECTED NOT DETECTED Final   Respiratory Syncytial Virus NOT DETECTED NOT DETECTED Final   Bordetella pertussis NOT DETECTED NOT DETECTED Final   Chlamydophila pneumoniae NOT DETECTED NOT DETECTED Final   Mycoplasma pneumoniae NOT DETECTED NOT DETECTED Final    Comment: Performed at [facility], [place] [address], [place], [place] [address]    Lab Basic Metabolic Panel: Recent Labs  Lab 09/17/17 0500  09/20/17 0328 09/21/17 0429 09/21/17 2001 09/22/17 1300 2017/10/13 0421  NA 132*   < > 134* 136 137 137 141  K 4.2   < > 4.5 4.3 4.1 4.0 3.8  CL 102   < > 104 104 107 106 105  CO2 18*   < > 17* 20* 20* 21* 23  GLUCOSE 243*   < > 146* 132* 179* 185* 140*  BUN 58*   < > 89* 98* 90* 82* 75*  CREATININE 3.13*   < > 3.23* 3.08* 2.65* 2.44* 2.28*  CALCIUM 7.9*   < > 8.5* 8.6* 8.1* 8.0* 8.2*  MG 1.5*  --  2.0  --  2.1  --   --   PHOS 5.5*  --   --   --   --   --   --    < > = values in this interval not displayed.   Liver Function Tests: No results for input(s): AST, ALT, ALKPHOS, BILITOT, PROT, ALBUMIN in the last 168 hours. No results for input(s): LIPASE, AMYLASE in  the last 168 hours. No results for input(s): AMMONIA in the last 168 hours. CBC: Recent Labs  Lab 09/17/17 0500 09/18/17 0500 09/19/17 0344 09/20/17 0328 10-13-17 0421  WBC 15.3* 12.9* 13.0* 13.5* 18.7*  NEUTROABS  --   --  10.5*  --  15.9*  HGB 12.7 11.9* 12.0 12.8 13.7  HCT 38.3 35.7* 36.7 38.5 42.3  MCV 91.2 90.6 89.3 88.7 89.4  PLT 214 152 155 177 176   Cardiac Enzymes: Recent Labs  Lab 09/17/17 0500  TROPONINI 1.05*   Sepsis Labs: Recent Labs  Lab 09/17/17 0500 09/18/17 0500 09/19/17 0344 09/20/17  0328 2017/10/16 0421  PROCALCITON 2.29  --   --   --   --   WBC 15.3* 12.9* 13.0* 13.5* 18.7*    Procedures/Operations     Chipper Oman, MD  2017-10-16, 11:04 AM

## 2017-10-05 NOTE — Progress Notes (Signed)
   02-Oct-2017 1000  Clinical Encounter Type  Visited With Patient and family together;Health care provider  Visit Type Death  Referral From Nurse  Consult/Referral To Chaplain  Spiritual Encounters  Spiritual Needs Emotional;Prayer;Grief support   Responded to a [facility] for EOL.  Upon arriving I learned the patient had died.  Daughter and granddaughter present.  Support them and learned about the patient.  Turns out today is the daughter's birthday.  We prayed together and I was able to give the nurse information about arrangements.  Will follow and support as needed. Chaplain Katherene Ponto

## 2017-10-05 NOTE — Progress Notes (Signed)
Note acute event this morning, patient has been transitioned to full comfort care.   Cardiology will sign off, please give [place] a call if needed.   Hilbert Corrigan PA Pager: [phone]

## 2017-10-05 NOTE — Progress Notes (Signed)
Daily Progress Note   Patient Name: Sandra [name]       Date: October 22, 2017 DOB: [birth year]  Age: 84 y.o. MRN#: [id number] Attending Physician: Patrecia Pour, Christean Grief, MD Primary Care Physician: Cassandria Anger, MD Admit Date: 09/04/2017  Reason for Consultation/Follow-up: Establishing goals of care  Subjective:  patient is on NRB mask, she is resting in bed She appears weak and deconditioned,  Call placed, unable to reach daughter [name] at [phone]. Patient is not awake, not alert, she appears to have increased work of breathing.  Discussed with [facility] MD, see below:   Length of Stay: 9  Current Medications: Scheduled Meds:  . carvedilol  3.125 mg Oral BID WC  . cholecalciferol  1,000 Units Oral Daily  . clopidogrel  75 mg Oral Daily  . feeding supplement (ENSURE ENLIVE)  237 mL Oral BID BM  . mouth rinse  15 mL Mouth Rinse BID  . traZODone  150 mg Oral QHS    Continuous Infusions: . sodium chloride Stopped (09/20/17 0923)  . sodium chloride    . amiodarone 30 mg/hr (10/22/2017 0128)  . morphine      PRN Meds: Place/Maintain arterial line **AND** sodium chloride, acetaminophen **OR** acetaminophen, antiseptic oral rinse, docusate, glycopyrrolate **OR** glycopyrrolate **OR** glycopyrrolate, haloperidol **OR** haloperidol **OR** haloperidol lactate, morphine injection, morphine, ondansetron **OR** ondansetron (ZOFRAN) IV, polyvinyl alcohol, traMADol  Physical Exam         Appears sleepy on the NRB mask  increased effort of breathing Using abdominal accessory muscles of respiration.  Irregularly irregular  Abdomen soft Has trace edema Appears weak, chronically deconditioned and ill  Vital Signs: BP 117/68   Pulse 78   Temp (!) 97.5 F (36.4 C) (Axillary)   Resp (!) 26    Ht 5\' 2"  (1.575 m)   Wt 76.2 kg (168 lb 1.6 oz)   SpO2 (!) 87%   BMI 30.75 kg/m  SpO2: SpO2: (!) 87 % O2 Device: O2 Device: NRB O2 Flow Rate: O2 Flow Rate (L/min): 15 L/min  Intake/output summary:   Intake/Output Summary (Last 24 hours) at 2017/10/22 0757 Last data filed at 10-22-2017 0600 Gross per 24 hour  Intake 474.93 ml  Output -  Net 474.93 ml   LBM: Last BM Date: 09/12/2017 Baseline Weight: Weight: 63.5 kg (140 lb) Most recent  weight: Weight: 76.2 kg (168 lb 1.6 oz)      PPS 20% Palliative Assessment/Data:      Patient Active Problem List   Diagnosis Date Noted  . Encounter for palliative care   . Atrial fibrillation with RVR ([place])   . Sepsis ([place])   . Cardiac arrest ([place])   . Endotracheally intubated   . Shortness of breath 09/30/2017  . CAP (community acquired pneumonia) 09/07/2017  . Goals of care, counseling/discussion 03/25/2017  . Anemia 02/15/2016  . Fatigue 11/13/2015  . Pain in joint, shoulder region 08/27/2015  . Duodenitis without bleeding 03/23/2015  . Fall at home 03/19/2015  . Facial trauma-right side 03/19/2015  . Nausea vomiting and diarrhea 03/19/2015  . Dehydration 03/19/2015  . Acute kidney injury ([place]) 03/19/2015  . Constipation 02/14/2015  . Peripheral neuropathy 02/03/2015  . Headache 01/30/2015  . Acute confusional state 01/30/2015  . Trigeminal neuralgia of right side of face 01/24/2015  . Sweats, menopausal 06/19/2014  . Neoplasm of uncertain behavior of skin 06/15/2013  . Cystitis 03/16/2013  . Bruising 02/28/2013  . Diarrhea 11/11/2012  . Eczema 04/26/2012  . Need for prophylactic vaccination and inoculation against influenza 04/26/2012  . Fever 03/09/2012  . Ataxia 01/20/2012  . Staggering 01/12/2012  . Occipital neuralgia 06/25/2011  . Grief reaction 01/13/2011  . Chest pain at rest 01/13/2011  . Paresthesia 11/13/2010  . LOW BACK PAIN 04/17/2010  . WHEEZING 06/07/2009  . TOBACCO USE, QUIT 05/25/2009  . BRONCHITIS,  ACUTE 07/19/2008  . Cough 07/19/2008  . CONTUSION, LOWER LEG, LEFT 09/27/2007  . B12 deficiency 09/15/2007  . PERIPHERAL NEUROPATHY 09/15/2007  . Vitamin D deficiency 06/14/2007  . Anxiety state 06/14/2007  . ABNORMAL GLUCOSE NEC 06/14/2007  . Dyslipidemia 05/21/2007  . Essential hypertension 05/21/2007  . OSTEOPOROSIS 05/21/2007  . Insomnia 05/21/2007  . COLONIC POLYPS, HX OF 05/21/2007    Palliative Care Assessment & Plan   Patient Profile:    Assessment:  38F with hypertension here with PEA arrest, newly diagnosed acute systolic and diastolic heart failure, and atrial fibrillation with RVR. PMT consulted for [place] discussions.   Recommendations/Plan Patient's overall condition appears such that she is actively dying, she is no longer responsive, she has increased work of breathing. My colleague Dr Quincy Simmonds [facility] MD has discussed over the phone with the patient's daughter, it has been determined that the most appropriate course of action to take, at this point, is full scope of comfort measures. Agree with initiation of end of life order set, patient not safe to transfer to residential hospice in my opinion. Anticipate hospital death. I have written for morphine infusion and also morphine boluses for comfort. Request chaplain consult. request comfort cart be provided when family comes in to the hospital. Prognosis hours to some very limited number of days at this point.    Code Status:    Code Status Orders  (From admission, onward)        Start     Ordered   09/15/17 1131  Do not attempt resuscitation (DNR)  Continuous    Question Answer Comment  In the event of cardiac or respiratory ARREST Do not call a "code blue"   In the event of cardiac or respiratory ARREST Do not perform Intubation, CPR, defibrillation or ACLS   In the event of cardiac or respiratory ARREST Use medication by any route, position, wound care, and other measures to relive pain and suffering. May use oxygen,  suction and manual treatment of  airway obstruction as needed for comfort.   Comments levophed infusion ok, no higher than 20 mcg/kg      09/15/17 1131    Code Status History    Date Active Date Inactive Code Status Order ID Comments User Context   09/11/2017 20:01 09/15/2017 11:31 Full Code [id number]  Jani Gravel, MD Inpatient   03/19/2015 20:04 03/24/2015 16:14 Full Code [id number]  Modena Jansky, MD Inpatient    Advance Directive Documentation     Most Recent Value  Type of Advance Directive  Healthcare Power of Attorney, Living will  Pre-existing out of facility DNR order (yellow form or pink MOST form)  No data  "MOST" Form in Place?  No data       Prognosis:   hours to days  Discharge Planning:  Anticipate hospital death  Care plan was discussed with [facility] MD  Thank you for allowing the Palliative Medicine Team to assist in the care of this patient.   Time In: 7.30 Time Out: 8 Total Time 30 Prolonged Time Billed  no       Greater than 50%  of this time was spent counseling and coordinating care related to the above assessment and plan.  Loistine Chance, MD [phone]  Please contact Palliative Medicine Team phone at [phone] for questions and concerns.

## 2017-10-05 NOTE — Progress Notes (Addendum)
PROGRESS NOTE [facility]   KASSIE KENG   QIW:[id number] DOB: January 26, 1929  DOA: 09/04/2017 PCP: Cassandria Anger, MD   Brief Narrative:  Sandra [name] is a 84 year old female with past medical history of hypertension and dyslipidemia who presented to the emergency department with dyspnea was admitted with working diagnosis of sepsis due to community-acquired pneumonia.  Patient developed acute hypoxic respiratory failure and had PEA cardiac arrest, required ACLS went to ROCS, and was placed on mechanical ventilation.  Patient was successfully extubated and has been treated with IV antibiotics.  Subsequently patient developed A. fib with RVR and continues to have increasing respiratory effort. Patient now DNR  Subjective: Called by RN for rapid response.  Patient not responsive and with increasing work of breathing, desaturating to 88% on nonrebreather.  Upon my read on arrival patient does not respond to verbal or tactile stimuli (sternal rub).  Patient in agonal breathing.  Blood pressure stable.  Lung sounds wet.  I have ordered Lasix 40 mg IV.  Discussed with daughter events and prognosis, daughter reported that she wants her mother to be comfortable and not in pain or suffering.  I have discussed that comfort measures and she agrees to begin comfort care at this point.  Case discussed with palliative care.  Assessment & Plan: Acute hypoxic respiratory failure due to community-acquired pneumonia - streptococcal pneumonia. Slow recovery and now back to resp distress due to fluid overload.  Status post cardiac arrest and intubation, she was successfully extubated. Respiratory status continued to worsen she continues to decide on NRB  Patient completed treatment with Levaquin for 8 days Chest x-ray shows increasing pleural effusion concerning for CHF exacerbation Lasix x 2 given. Case discussed with daughter and patient will be placed on comfort measures Will start on morphine and  Ativan for respiratory distress/palliative measure  New systolic heart failure with new onset atrial fibrillation, complicated with PEA cardiac arrest status post ROCS Patient remains in A. fib with RVR HR was stable was on Amio ggt will d/c for now as patients is in comfort care  Patient was placed on Plavix, as not candidate for anticoagulation due to episode of genital bleeding. Echo shows LV function severely depressed with EF of 25-30%  Acute renal failure Likely secondary to hypotension from cardiac arrest Serum cr stable  Avoid hypotension and nephrotoxic agents  Hypertension Patient was placed on Coreg, this will be d/ced as patient is not responsive and unable to tolerate oral route. BP is stable  Discontinue amlodipine due to severe LV dysfunction Clonidine on hold Continue to monitor  Depression   Acute metabolic encephalopathy Multifactorial from renal failure, heart failure and infectious process Unresponsive today   Goals of care Patient with multiorgan failure, include heart, respiratory and renal failure.  Continues to deteriorate now on acute respiratory distress and unresponsive.  I have discussed with daughter the patient has very poor prognosis. Patient is s/p cardiac arrest and mechanical ventilation, with LV EF 25-30%, developed afib and now with fluid overload and hypoxia on NRB. Patient is DNR/DNI. Will transition to comfort care. Palliative care has place orders for comfort measures. Expect hospital death.   DVT prophylaxis: Lovenox Code Status: DNR Family Communication: None at bedside Disposition Plan: Expect hospital death with 24-48 hrs.   Consultants:   Cardiology  Palliative care  Procedures:   None    Antimicrobials:  Levaquin   Objective: Vitals:   10/14/2017 0645 10/14/2017 4174 10-14-2017 0725 10/14/2017 0735  BP:  117/68   Pulse:   78   Resp:      Temp:      TempSrc:      SpO2: (!) 84% 91%  (!) 87%  Weight:      Height:         Intake/Output Summary (Last 24 hours) at 2017/10/18 0750 Last data filed at Oct 18, 2017 0600 Gross per 24 hour  Intake 474.93 ml  Output -  Net 474.93 ml   Filed Weights   09/21/17 0500 09/22/17 0500 18-Oct-2017 0507  Weight: 77.8 kg (171 lb 9.6 oz) 80.5 kg (177 lb 9 oz) 76.2 kg (168 lb 1.6 oz)    Examination:  General exam: Severe respiratory distress  HEENT: on NRB mask,  Respiratory system: Decrease breath sound b/l, bibasilar cracrkes, use accessory muscles  Cardiovascular system: S1S2 irr irr,  Gastrointestinal system: Abd soft, NTND  Central nervous system: unresponsive to sternal rub  Extremities: Anasarca  Skin: multiple ecchymosis     Data Reviewed: I have personally reviewed following labs and imaging studies  CBC: Recent Labs  Lab 09/17/17 0500 09/18/17 0500 09/19/17 0344 09/20/17 0328 10/18/17 0421  WBC 15.3* 12.9* 13.0* 13.5* 18.7*  NEUTROABS  --   --  10.5*  --  15.9*  HGB 12.7 11.9* 12.0 12.8 13.7  HCT 38.3 35.7* 36.7 38.5 42.3  MCV 91.2 90.6 89.3 88.7 89.4  PLT 214 152 155 177 166   Basic Metabolic Panel: Recent Labs  Lab 09/17/17 0500  09/20/17 0328 09/21/17 0429 09/21/17 2001 09/22/17 1300 Oct 18, 2017 0421  NA 132*   < > 134* 136 137 137 141  K 4.2   < > 4.5 4.3 4.1 4.0 3.8  CL 102   < > 104 104 107 106 105  CO2 18*   < > 17* 20* 20* 21* 23  GLUCOSE 243*   < > 146* 132* 179* 185* 140*  BUN 58*   < > 89* 98* 90* 82* 75*  CREATININE 3.13*   < > 3.23* 3.08* 2.65* 2.44* 2.28*  CALCIUM 7.9*   < > 8.5* 8.6* 8.1* 8.0* 8.2*  MG 1.5*  --  2.0  --  2.1  --   --   PHOS 5.5*  --   --   --   --   --   --    < > = values in this interval not displayed.   GFR: Estimated Creatinine Clearance: 16.9 mL/min (A) (by C-G formula based on SCr of 2.28 mg/dL (H)). Liver Function Tests: No results for input(s): AST, ALT, ALKPHOS, BILITOT, PROT, ALBUMIN in the last 168 hours. No results for input(s): LIPASE, AMYLASE in the last 168 hours. No results for  input(s): AMMONIA in the last 168 hours. Coagulation Profile: No results for input(s): INR, PROTIME in the last 168 hours. Cardiac Enzymes: Recent Labs  Lab 09/17/17 0500  TROPONINI 1.05*   BNP (last 3 results) No results for input(s): PROBNP in the last 8760 hours. HbA1C: No results for input(s): HGBA1C in the last 72 hours. CBG: Recent Labs  Lab 09/22/17 0750 09/22/17 1203 09/22/17 1733 09/22/17 2114 2017/10/18 0742  GLUCAP 121* 124* 168* 158* 181*   Lipid Profile: No results for input(s): CHOL, HDL, LDLCALC, TRIG, CHOLHDL, LDLDIRECT in the last 72 hours. Thyroid Function Tests: No results for input(s): TSH, T4TOTAL, FREET4, T3FREE, THYROIDAB in the last 72 hours. Anemia Panel: No results for input(s): VITAMINB12, FOLATE, FERRITIN, TIBC, IRON, RETICCTPCT in the last 72 hours. Sepsis Labs: Recent Labs  Lab 09/17/17 0500  PROCALCITON 2.29    Recent Results (from the past 240 hour(s))  Blood culture (routine x 2)     Status: None   Collection Time: 09/18/2017  6:40 PM  Result Value Ref Range Status   Specimen Description   Final    BLOOD RIGHT ANTECUBITAL Performed at [facility] [address], [place], [place] [address]    Special Requests   Final    BOTTLES DRAWN AEROBIC AND ANAEROBIC Blood Culture adequate volume Performed at [facility] [address] [place], [place] [address]    Culture   Final    NO GROWTH 5 DAYS Performed at [facility], [place] [address], [place], [place] [address]    Report Status 09/19/2017 FINAL  Final  Urine culture     Status: Abnormal   Collection Time: 09/05/2017  6:50 PM  Result Value Ref Range Status   Specimen Description   Final    URINE, CLEAN CATCH Performed at [facility], [place] [address] [place], [place] [address]    Special Requests   Final    Normal Performed at [facility], [place] [address] [place], [place] [address]    Culture  MULTIPLE SPECIES PRESENT, SUGGEST RECOLLECTION (A)  Final   Report Status 09/16/2017 FINAL  Final  Blood culture (routine x 2)     Status: None   Collection Time: 09/24/2017  6:55 PM  Result Value Ref Range Status   Specimen Description   Final    BLOOD LEFT ANTECUBITAL Performed at [facility] [address] [place], [place] [address]    Special Requests   Final    BOTTLES DRAWN AEROBIC AND ANAEROBIC Blood Culture adequate volume Performed at [facility] [address], [place], [place] [address]    Culture   Final    NO GROWTH 5 DAYS Performed at [facility], [place] [address], [place], [place] [address]    Report Status 09/19/2017 FINAL  Final  MRSA PCR Screening     Status: None   Collection Time: 09/15/17  2:03 AM  Result Value Ref Range Status   MRSA by PCR NEGATIVE NEGATIVE Final    Comment:        The GeneXpert MRSA Assay (FDA approved for NASAL specimens only), is one component of a comprehensive MRSA colonization surveillance program. It is not intended to diagnose MRSA infection nor to guide or monitor treatment for MRSA infections. Performed at [facility], [place] [address], [place], [place] [address]   Respiratory Panel by PCR     Status: None   Collection Time: 09/15/17  7:29 AM  Result Value Ref Range Status   Adenovirus NOT DETECTED NOT DETECTED Final   Coronavirus 229E NOT DETECTED NOT DETECTED Final   Coronavirus HKU1 NOT DETECTED NOT DETECTED Final   Coronavirus NL63 NOT DETECTED NOT DETECTED Final   Coronavirus OC43 NOT DETECTED NOT DETECTED Final   Metapneumovirus NOT DETECTED NOT DETECTED Final   Rhinovirus / Enterovirus NOT DETECTED NOT DETECTED Final   Influenza A NOT DETECTED NOT DETECTED Final   Influenza B NOT DETECTED NOT DETECTED Final   Parainfluenza Virus 1 NOT DETECTED NOT DETECTED Final   Parainfluenza Virus 2 NOT DETECTED NOT DETECTED Final   Parainfluenza Virus 3 NOT  DETECTED NOT DETECTED Final   Parainfluenza Virus 4 NOT DETECTED NOT DETECTED Final   Respiratory Syncytial Virus NOT DETECTED  NOT DETECTED Final   Bordetella pertussis NOT DETECTED NOT DETECTED Final   Chlamydophila pneumoniae NOT DETECTED NOT DETECTED Final   Mycoplasma pneumoniae NOT DETECTED NOT DETECTED Final    Comment: Performed at [facility], [place] [address], [place], [place] [address]      Radiology Studies: Dg Chest 1 View  Result Date: 09/21/2017 CLINICAL DATA:  Dyspnea and cough EXAM: CHEST  1 VIEW COMPARISON:  09/18/2017 FINDINGS: Cardiac shadow is enlarged but stable. Aortic calcifications are seen. Bibasilar changes are noted with increasing right-sided pleural effusion when compared with the recent exam. No bony abnormality is noted. IMPRESSION: Bibasilar atelectatic changes and increasing right-sided pleural effusion. Electronically Signed   By: Inez Catalina M.D.   On: 09/21/2017 19:30   Dg Abd 1 View  Result Date: 09/21/2017 CLINICAL DATA:  Pain EXAM: ABDOMEN - 1 VIEW COMPARISON:  First 06/25/2018 FINDINGS: There is oral contrast in the ascending and transverse colon regions. There is no bowel dilatation or air-fluid level to suggest bowel obstruction. No free air. There is airspace consolidation in the visualized portions of both lower lung zones. There is a presumed phlebolith lower right pelvis. IMPRESSION: Moderate contrast within the proximal to mid colon. No bowel dilatation to suggest bowel obstruction. No free air. Airspace consolidation both lower lung zones, incompletely visualized. Electronically Signed   By: Lowella Grip III M.D.   On: 09/21/2017 09:41   Dg Chest Port 1 View  Result Date: 09/22/2017 CLINICAL DATA:  Increased oxygen demand. EXAM: PORTABLE CHEST 1 VIEW COMPARISON:  Yesterday at 1853 hour, additional priors FINDINGS: Progressive bilateral pleural effusions and associated airspace opacities over the past week. Cardiomegaly which is partially  obscured. Bilateral perihilar opacities suspicious for pulmonary edema. No pneumothorax. IMPRESSION: Progressive bilateral pleural effusions and associated airspace opacities over the past week. Findings suspicious for CHF exacerbation. An element of pneumonia is also considered. Electronically Signed   By: Jeb Levering M.D.   On: 09/22/2017 03:19    Scheduled Meds: . carvedilol  3.125 mg Oral BID WC  . cholecalciferol  1,000 Units Oral Daily  . clopidogrel  75 mg Oral Daily  . feeding supplement (ENSURE ENLIVE)  237 mL Oral BID BM  . mouth rinse  15 mL Mouth Rinse BID  . traZODone  150 mg Oral QHS   Continuous Infusions: . sodium chloride Stopped (09/20/17 0923)  . amiodarone 30 mg/hr (10-07-17 0128)     LOS: 9 days    Time spent: Total of 35 minutes spent with pt, greater than 50% of which was spent in discussion of  treatment, counseling and coordination of care   Chipper Oman, MD Pager: Text Page via [email or web address]   If 7PM-7AM, please contact night-coverage [email or web address] 10-07-17, 7:50 AM   Note - This record has been created using [employer]. Chart creation errors have been sought, but may not always have been located. Such creation errors do not reflect on the standard of medical care.

## 2017-10-05 NOTE — Progress Notes (Signed)
PT transported via funeral home to [facility]. Family aware and transported with pt. No questions or concerns at this time.

## 2017-10-05 NOTE — Progress Notes (Signed)
RN notified by central telemetry of pt ringing out aystole. Rn verified no pulse no breathing upon assessing patient. RN declared death with Stanton Kidney, RN. MD notified of death and death certificate was completed. Post-mortem care was completed after family was allowed time with pt. [facility] was notified along with the funeral home. Pt will be going to [facility]. Family was notified that funeral home was on the way. Family currently in room with pt. No concerns at this time.

## 2017-10-05 DEATH — deceased

## 2019-07-23 IMAGING — DX DG CHEST 1V PORT
1 series · 1 of 1 positions shown · non-contrast
Comparison: September 15, 2017

CLINICAL DATA: Hypoxia

EXAM:
PORTABLE CHEST 1 VIEW

[chest ap]
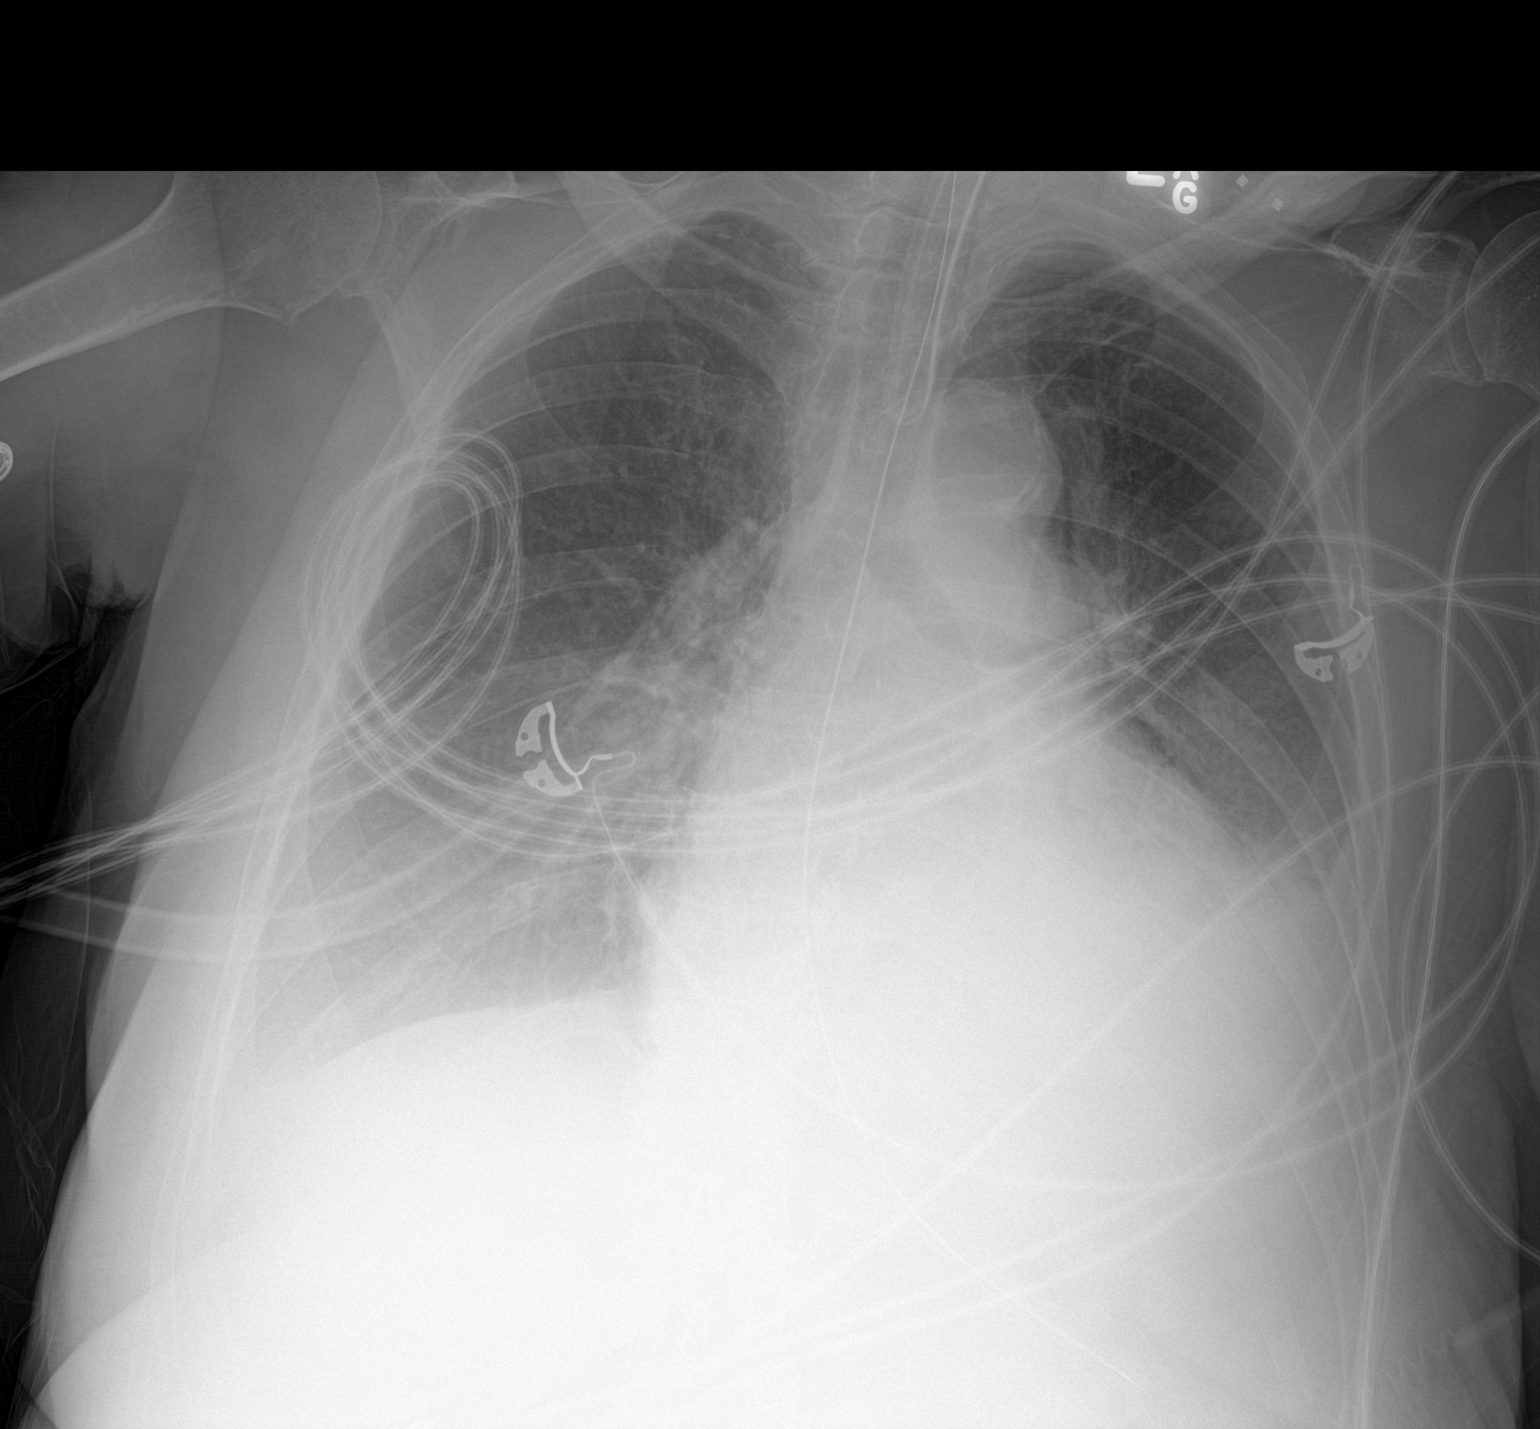

[1 of 1 positions shown; findings below may reference images not displayed]

FINDINGS: Endotracheal tube tip is 3.2 cm above the carina. Nasogastric tube
tip and side port are in the stomach. No pneumothorax.

There is left lower lobe airspace consolidation with left pleural
effusion. There is a minimal right pleural effusion. Lungs elsewhere
clear. There is cardiomegaly with pulmonary vascularity within
normal limits. There is aortic atherosclerosis. No adenopathy. No
bone lesions.
IMPRESSION: Tube positions as described without evident pneumothorax. Airspace
consolidation left lower lobe with left pleural effusion, increased
from 1 day prior. Rather minimal right pleural effusion present.
Right lung otherwise clear. Stable cardiac prominence. There is
aortic atherosclerosis.

Aortic Atherosclerosis ([id number].T).
